# Patient Record
Sex: Female | Born: 1955 | Race: White | Hispanic: No | Marital: Married | Smoking: Never smoker
Health system: Southern US, Community
[De-identification: ages and names within clinical notes are randomized; demographics above are authoritative.]

## PROBLEM LIST (undated history)

## (undated) DIAGNOSIS — K068 Other specified disorders of gingiva and edentulous alveolar ridge: Secondary | ICD-10-CM

## (undated) DIAGNOSIS — C4491 Basal cell carcinoma of skin, unspecified: Secondary | ICD-10-CM

## (undated) DIAGNOSIS — G4730 Sleep apnea, unspecified: Secondary | ICD-10-CM

## (undated) DIAGNOSIS — K146 Glossodynia: Secondary | ICD-10-CM

## (undated) DIAGNOSIS — F319 Bipolar disorder, unspecified: Secondary | ICD-10-CM

## (undated) DIAGNOSIS — F419 Anxiety disorder, unspecified: Secondary | ICD-10-CM

## (undated) DIAGNOSIS — Z8719 Personal history of other diseases of the digestive system: Secondary | ICD-10-CM

## (undated) DIAGNOSIS — D649 Anemia, unspecified: Secondary | ICD-10-CM

## (undated) DIAGNOSIS — M1990 Unspecified osteoarthritis, unspecified site: Secondary | ICD-10-CM

## (undated) DIAGNOSIS — Z9289 Personal history of other medical treatment: Secondary | ICD-10-CM

## (undated) DIAGNOSIS — R011 Cardiac murmur, unspecified: Secondary | ICD-10-CM

## (undated) DIAGNOSIS — R7303 Prediabetes: Secondary | ICD-10-CM

## (undated) HISTORY — PX: TONSILLECTOMY: SUR1361

## (undated) HISTORY — DX: Bipolar disorder, unspecified: F31.9

## (undated) HISTORY — DX: Glossodynia: K14.6

## (undated) HISTORY — PX: GANGLION CYST EXCISION: SHX1691

## (undated) HISTORY — DX: Personal history of other diseases of the digestive system: Z87.19

## (undated) HISTORY — PX: ENDOMETRIAL BIOPSY: SHX622

## (undated) HISTORY — DX: Basal cell carcinoma of skin, unspecified: C44.91

## (undated) HISTORY — DX: Other specified disorders of gingiva and edentulous alveolar ridge: K06.8

---

## 1998-09-06 ENCOUNTER — Other Ambulatory Visit: Admission: RE | Admit: 1998-09-06 | Discharge: 1998-09-06 | Payer: Self-pay | Admitting: Obstetrics & Gynecology

## 1999-12-11 ENCOUNTER — Encounter: Admission: RE | Admit: 1999-12-11 | Discharge: 1999-12-11 | Payer: Self-pay | Admitting: Family Medicine

## 1999-12-11 ENCOUNTER — Encounter: Payer: Self-pay | Admitting: Family Medicine

## 2000-04-04 ENCOUNTER — Encounter: Payer: Self-pay | Admitting: Gastroenterology

## 2000-04-04 ENCOUNTER — Ambulatory Visit (HOSPITAL_COMMUNITY): Admission: RE | Admit: 2000-04-04 | Discharge: 2000-04-04 | Payer: Self-pay | Admitting: Gastroenterology

## 2003-01-05 ENCOUNTER — Other Ambulatory Visit: Admission: RE | Admit: 2003-01-05 | Discharge: 2003-01-05 | Payer: Self-pay | Admitting: *Deleted

## 2004-09-20 ENCOUNTER — Other Ambulatory Visit: Admission: RE | Admit: 2004-09-20 | Discharge: 2004-09-20 | Payer: Self-pay | Admitting: Family Medicine

## 2008-03-19 HISTORY — PX: BASAL CELL CARCINOMA EXCISION: SHX1214

## 2011-09-07 ENCOUNTER — Other Ambulatory Visit: Payer: Self-pay | Admitting: Neurosurgery

## 2011-09-11 ENCOUNTER — Encounter (HOSPITAL_COMMUNITY): Payer: Self-pay | Admitting: Respiratory Therapy

## 2011-09-12 NOTE — Pre-Procedure Instructions (Signed)
20 Andrea Paul   09/12/2011   Your procedure is scheduled on: Monday, July 1ST.            Report to [facility] at  7:55 AM  Call this number if you have problems the morning of surgery: [phone]   Remember:   Do not eat food or drink any liquids:After Midnight  Sunday.     Take these medicines the morning of surgery with A SIP OF WATER:               ATIVAN, TRAMADOL, AND LEXAPRO   Do not wear jewelry, make-up or nail polish.   Do not wear lotions, powders, or perfumes. You may wear deodorant.   Do not shave 48 hours prior to surgery. Men may shave face and neck.   Do not bring valuables to the hospital.  Contacts, dentures or bridgework may not be worn into surgery.   Leave suitcase in the car. After surgery it may be brought to your room.  For patients admitted to the hospital, checkout time is 11:00 AM the day of discharge.   Patients discharged the day of surgery will not be allowed to drive home.  Name and phone number of your driver:    Special Instructions: CHG Shower Use Special Wash: 1/2 bottle night before surgery and 1/2 bottle morning of surgery.   Please read over the following fact sheets that you were given: Pain Booklet, Coughing and Deep Breathing, Blood Transfusion Information, MRSA Information and Surgical Site Infection Prevention

## 2011-09-13 ENCOUNTER — Encounter (HOSPITAL_COMMUNITY)
Admission: RE | Admit: 2011-09-13 | Discharge: 2011-09-13 | Disposition: A | Discharge status: Home / Self Care | Payer: 59 | Source: Ambulatory Visit | Attending: Neurosurgery | Admitting: Neurosurgery

## 2011-09-13 ENCOUNTER — Encounter (HOSPITAL_COMMUNITY): Payer: Self-pay

## 2011-09-13 HISTORY — DX: Unspecified osteoarthritis, unspecified site: M19.90

## 2011-09-13 HISTORY — DX: Cardiac murmur, unspecified: R01.1

## 2011-09-13 HISTORY — DX: Personal history of other medical treatment: Z92.89

## 2011-09-13 HISTORY — DX: Anxiety disorder, unspecified: F41.9

## 2011-09-13 LAB — SURGICAL PCR SCREEN
MRSA, PCR: NEGATIVE
Staphylococcus aureus: NEGATIVE

## 2011-09-13 LAB — CBC
HCT: 41.8 % (ref 36.0–46.0)
Platelets: 186 10*3/uL (ref 150–400)
RDW: 12.3 % (ref 11.5–15.5)
WBC: 6.6 10*3/uL (ref 4.0–10.5)

## 2011-09-13 NOTE — Progress Notes (Signed)
Primary Physician - Dr. Sandi Mealy @[facility]  Does not have a cardiologist No EKG or other cardiac workup available

## 2011-09-16 MED ORDER — CEFAZOLIN SODIUM 1-5 GM-% IV SOLN
1.0000 g | INTRAVENOUS | Status: AC
  Administered 2011-09-17: 1 g via INTRAVENOUS
  Filled 2011-09-16: qty 50

## 2011-09-17 ENCOUNTER — Encounter (HOSPITAL_COMMUNITY): Payer: Self-pay | Admitting: Anesthesiology

## 2011-09-17 ENCOUNTER — Ambulatory Visit (HOSPITAL_COMMUNITY)
Admission: RE | Admit: 2011-09-17 | Discharge: 2011-09-18 | Disposition: A | Discharge status: Home / Self Care | Payer: 59 | Source: Ambulatory Visit | Attending: Neurosurgery | Admitting: Neurosurgery

## 2011-09-17 ENCOUNTER — Ambulatory Visit (HOSPITAL_COMMUNITY): Payer: 59

## 2011-09-17 ENCOUNTER — Ambulatory Visit (HOSPITAL_COMMUNITY): Payer: 59 | Admitting: Anesthesiology

## 2011-09-17 ENCOUNTER — Encounter (HOSPITAL_COMMUNITY): Payer: Self-pay | Admitting: Surgery

## 2011-09-17 ENCOUNTER — Encounter (HOSPITAL_COMMUNITY)
Admission: RE | Disposition: A | Discharge status: Home / Self Care | Payer: Self-pay | Source: Ambulatory Visit | Attending: Neurosurgery

## 2011-09-17 DIAGNOSIS — Z01812 Encounter for preprocedural laboratory examination: Secondary | ICD-10-CM | POA: Insufficient documentation

## 2011-09-17 DIAGNOSIS — M129 Arthropathy, unspecified: Secondary | ICD-10-CM | POA: Insufficient documentation

## 2011-09-17 DIAGNOSIS — F3289 Other specified depressive episodes: Secondary | ICD-10-CM | POA: Insufficient documentation

## 2011-09-17 DIAGNOSIS — M47812 Spondylosis without myelopathy or radiculopathy, cervical region: Secondary | ICD-10-CM | POA: Insufficient documentation

## 2011-09-17 DIAGNOSIS — F411 Generalized anxiety disorder: Secondary | ICD-10-CM | POA: Insufficient documentation

## 2011-09-17 DIAGNOSIS — M5030 Other cervical disc degeneration, unspecified cervical region: Secondary | ICD-10-CM | POA: Insufficient documentation

## 2011-09-17 DIAGNOSIS — F329 Major depressive disorder, single episode, unspecified: Secondary | ICD-10-CM | POA: Insufficient documentation

## 2011-09-17 DIAGNOSIS — E7800 Pure hypercholesterolemia, unspecified: Secondary | ICD-10-CM | POA: Insufficient documentation

## 2011-09-17 HISTORY — PX: ANTERIOR CERVICAL DECOMP/DISCECTOMY FUSION: SHX1161

## 2011-09-17 SURGERY — ANTERIOR CERVICAL DECOMPRESSION/DISCECTOMY FUSION 2 LEVELS
Anesthesia: General | Site: Spine Cervical | Laterality: Bilateral | Wound class: Clean

## 2011-09-17 MED ORDER — OXYCODONE-ACETAMINOPHEN 5-325 MG PO TABS
ORAL_TABLET | ORAL | Status: AC
  Filled 2011-09-17: qty 2

## 2011-09-17 MED ORDER — EPHEDRINE SULFATE 50 MG/ML IJ SOLN
INTRAMUSCULAR | Status: DC | PRN
  Administered 2011-09-17: 15 mg via INTRAVENOUS

## 2011-09-17 MED ORDER — HYDROCODONE-ACETAMINOPHEN 5-325 MG PO TABS: 1.0000 | ORAL_TABLET | ORAL | Status: DC | PRN

## 2011-09-17 MED ORDER — MIDAZOLAM HCL 5 MG/5ML IJ SOLN
INTRAMUSCULAR | Status: DC | PRN
  Administered 2011-09-17: 2 mg via INTRAVENOUS

## 2011-09-17 MED ORDER — ROCURONIUM BROMIDE 100 MG/10ML IV SOLN
INTRAVENOUS | Status: DC | PRN
  Administered 2011-09-17: 50 mg via INTRAVENOUS

## 2011-09-17 MED ORDER — LAMOTRIGINE 200 MG PO TABS
200.0000 mg | ORAL_TABLET | Freq: Every day | ORAL | Status: DC
  Administered 2011-09-17: 200 mg via ORAL
  Filled 2011-09-17 (×2): qty 1

## 2011-09-17 MED ORDER — LIDOCAINE HCL 4 % MT SOLN
OROMUCOSAL | Status: DC | PRN
  Administered 2011-09-17: 4 mL via TOPICAL

## 2011-09-17 MED ORDER — OXYCODONE-ACETAMINOPHEN 5-325 MG PO TABS
1.0000 | ORAL_TABLET | ORAL | Status: DC | PRN
  Administered 2011-09-17 (×3): 2 via ORAL
  Administered 2011-09-18: 1 via ORAL
  Filled 2011-09-17: qty 2
  Filled 2011-09-17: qty 1
  Filled 2011-09-17: qty 2

## 2011-09-17 MED ORDER — SODIUM CHLORIDE 0.9 % IJ SOLN: 3.0000 mL | INTRAMUSCULAR | Status: DC | PRN

## 2011-09-17 MED ORDER — BUPIVACAINE HCL (PF) 0.25 % IJ SOLN
INTRAMUSCULAR | Status: DC | PRN
  Administered 2011-09-17: 3.5 mL

## 2011-09-17 MED ORDER — ONDANSETRON HCL 4 MG/2ML IJ SOLN
INTRAMUSCULAR | Status: AC
  Filled 2011-09-17: qty 2

## 2011-09-17 MED ORDER — CYCLOBENZAPRINE HCL 10 MG PO TABS
ORAL_TABLET | ORAL | Status: AC
  Filled 2011-09-17: qty 1

## 2011-09-17 MED ORDER — 0.9 % SODIUM CHLORIDE (POUR BTL) OPTIME
TOPICAL | Status: DC | PRN
  Administered 2011-09-17: 1000 mL

## 2011-09-17 MED ORDER — MENTHOL 3 MG MT LOZG: 1.0000 | LOZENGE | OROMUCOSAL | Status: DC | PRN

## 2011-09-17 MED ORDER — PROPOFOL 10 MG/ML IV EMUL
INTRAVENOUS | Status: DC | PRN
  Administered 2011-09-17: 170 mg via INTRAVENOUS

## 2011-09-17 MED ORDER — HYDROMORPHONE HCL PF 1 MG/ML IJ SOLN
INTRAMUSCULAR | Status: AC
  Filled 2011-09-17: qty 1

## 2011-09-17 MED ORDER — ONDANSETRON HCL 4 MG/2ML IJ SOLN
4.0000 mg | Freq: Four times a day (QID) | INTRAMUSCULAR | Status: AC | PRN
  Administered 2011-09-17: 4 mg via INTRAVENOUS

## 2011-09-17 MED ORDER — DOCUSATE SODIUM 100 MG PO CAPS: 100.0000 mg | ORAL_CAPSULE | Freq: Every day | ORAL | Status: DC | PRN

## 2011-09-17 MED ORDER — INSULIN ASPART 100 UNIT/ML ~~LOC~~ SOLN
SUBCUTANEOUS | Status: AC
  Filled 2011-09-17: qty 1

## 2011-09-17 MED ORDER — ONDANSETRON HCL 4 MG/2ML IJ SOLN
INTRAMUSCULAR | Status: DC | PRN
  Administered 2011-09-17: 4 mg via INTRAVENOUS

## 2011-09-17 MED ORDER — SODIUM CHLORIDE 0.9 % IR SOLN
Status: DC | PRN
  Administered 2011-09-17: 10:00:00

## 2011-09-17 MED ORDER — GLYCOPYRROLATE 0.2 MG/ML IJ SOLN
INTRAMUSCULAR | Status: DC | PRN
  Administered 2011-09-17: 0.4 mg via INTRAVENOUS

## 2011-09-17 MED ORDER — THROMBIN 5000 UNITS EX KIT
PACK | CUTANEOUS | Status: DC | PRN
  Administered 2011-09-17 (×2): 5000 [IU] via TOPICAL

## 2011-09-17 MED ORDER — FENTANYL CITRATE 0.05 MG/ML IJ SOLN
INTRAMUSCULAR | Status: DC | PRN
  Administered 2011-09-17: 100 ug via INTRAVENOUS
  Administered 2011-09-17: 50 ug via INTRAVENOUS

## 2011-09-17 MED ORDER — ACETAMINOPHEN 650 MG RE SUPP: 650.0000 mg | RECTAL | Status: DC | PRN

## 2011-09-17 MED ORDER — HYDROMORPHONE HCL PF 1 MG/ML IJ SOLN: 0.2500 mg | INTRAMUSCULAR | Status: DC | PRN

## 2011-09-17 MED ORDER — ALUM & MAG HYDROXIDE-SIMETH 200-200-20 MG/5ML PO SUSP: 30.0000 mL | Freq: Four times a day (QID) | ORAL | Status: DC | PRN

## 2011-09-17 MED ORDER — PHENOL 1.4 % MT LIQD: 1.0000 | OROMUCOSAL | Status: DC | PRN

## 2011-09-17 MED ORDER — KETOROLAC TROMETHAMINE 30 MG/ML IJ SOLN
30.0000 mg | Freq: Once | INTRAMUSCULAR | Status: AC
  Administered 2011-09-17: 30 mg via INTRAVENOUS

## 2011-09-17 MED ORDER — HYDROXYZINE HCL 50 MG/ML IM SOLN: 50.0000 mg | INTRAMUSCULAR | Status: DC | PRN

## 2011-09-17 MED ORDER — HEMOSTATIC AGENTS (NO CHARGE) OPTIME
TOPICAL | Status: DC | PRN
  Administered 2011-09-17: 1 via TOPICAL

## 2011-09-17 MED ORDER — NEOSTIGMINE METHYLSULFATE 1 MG/ML IJ SOLN
INTRAMUSCULAR | Status: DC | PRN
  Administered 2011-09-17: 3 mg via INTRAVENOUS

## 2011-09-17 MED ORDER — ZOLPIDEM TARTRATE 5 MG PO TABS: 5.0000 mg | ORAL_TABLET | Freq: Every evening | ORAL | Status: DC | PRN

## 2011-09-17 MED ORDER — LIDOCAINE HCL (CARDIAC) 20 MG/ML IV SOLN
INTRAVENOUS | Status: DC | PRN
  Administered 2011-09-17: 80 mg via INTRAVENOUS

## 2011-09-17 MED ORDER — BISACODYL 10 MG RE SUPP: 10.0000 mg | Freq: Every day | RECTAL | Status: DC | PRN

## 2011-09-17 MED ORDER — LIDOCAINE-EPINEPHRINE 1 %-1:100000 IJ SOLN
INTRAMUSCULAR | Status: DC | PRN
  Administered 2011-09-17: 3.5 mL

## 2011-09-17 MED ORDER — SODIUM CHLORIDE 0.9 % IV SOLN
INTRAVENOUS | Status: AC
  Filled 2011-09-17: qty 500

## 2011-09-17 MED ORDER — HYDROXYZINE HCL 25 MG PO TABS: 50.0000 mg | ORAL_TABLET | ORAL | Status: DC | PRN

## 2011-09-17 MED ORDER — ACETAMINOPHEN 325 MG PO TABS: 650.0000 mg | ORAL_TABLET | ORAL | Status: DC | PRN

## 2011-09-17 MED ORDER — KCL IN DEXTROSE-NACL 20-5-0.45 MEQ/L-%-% IV SOLN
INTRAVENOUS | Status: DC
  Filled 2011-09-17 (×5): qty 1000

## 2011-09-17 MED ORDER — ESCITALOPRAM OXALATE 5 MG PO TABS
15.0000 mg | ORAL_TABLET | Freq: Every day | ORAL | Status: DC
  Administered 2011-09-18: 15 mg via ORAL
  Filled 2011-09-17 (×2): qty 1

## 2011-09-17 MED ORDER — KETOROLAC TROMETHAMINE 30 MG/ML IJ SOLN
INTRAMUSCULAR | Status: AC
  Filled 2011-09-17: qty 1

## 2011-09-17 MED ORDER — SIMVASTATIN 20 MG PO TABS
20.0000 mg | ORAL_TABLET | Freq: Every evening | ORAL | Status: DC
  Administered 2011-09-17: 20 mg via ORAL
  Filled 2011-09-17 (×2): qty 1

## 2011-09-17 MED ORDER — BACITRACIN 50000 UNITS IM SOLR
INTRAMUSCULAR | Status: AC
  Filled 2011-09-17: qty 1

## 2011-09-17 MED ORDER — CYCLOBENZAPRINE HCL 10 MG PO TABS
10.0000 mg | ORAL_TABLET | Freq: Three times a day (TID) | ORAL | Status: DC | PRN
  Administered 2011-09-17 (×2): 10 mg via ORAL
  Filled 2011-09-17: qty 1

## 2011-09-17 MED ORDER — SODIUM CHLORIDE 0.9 % IJ SOLN
3.0000 mL | Freq: Two times a day (BID) | INTRAMUSCULAR | Status: DC
  Administered 2011-09-17 – 2011-09-18 (×3): 3 mL via INTRAVENOUS

## 2011-09-17 MED ORDER — KETOROLAC TROMETHAMINE 30 MG/ML IJ SOLN
30.0000 mg | Freq: Four times a day (QID) | INTRAMUSCULAR | Status: DC
  Administered 2011-09-17 – 2011-09-18 (×3): 30 mg via INTRAVENOUS
  Filled 2011-09-17 (×7): qty 1

## 2011-09-17 MED ORDER — MAGNESIUM HYDROXIDE 400 MG/5ML PO SUSP: 30.0000 mL | Freq: Every day | ORAL | Status: DC | PRN

## 2011-09-17 MED ORDER — LORAZEPAM 1 MG PO TABS: 1.0000 mg | ORAL_TABLET | Freq: Four times a day (QID) | ORAL | Status: DC | PRN

## 2011-09-17 MED ORDER — LACTATED RINGERS IV SOLN
INTRAVENOUS | Status: DC | PRN
  Administered 2011-09-17 (×2): via INTRAVENOUS

## 2011-09-17 MED ORDER — MORPHINE SULFATE 4 MG/ML IJ SOLN: 4.0000 mg | INTRAMUSCULAR | Status: DC | PRN

## 2011-09-17 SURGICAL SUPPLY — 60 items
ADH SKN CLS APL DERMABOND .7 (GAUZE/BANDAGES/DRESSINGS) ×1
ALLOGRAFT 7X14X11 (Bone Implant) ×2 IMPLANT
APL SKNCLS STERI-STRIP NONHPOA (GAUZE/BANDAGES/DRESSINGS) ×1
BAG DECANTER FOR FLEXI CONT (MISCELLANEOUS) ×2 IMPLANT
BENZOIN TINCTURE PRP APPL 2/3 (GAUZE/BANDAGES/DRESSINGS) ×1 IMPLANT
BIT DRILL NEURO 2X3.1 SFT TUCH (MISCELLANEOUS) ×1 IMPLANT
BLADE ULTRA TIP 2M (BLADE) ×2 IMPLANT
BRUSH SCRUB EZ PLAIN DRY (MISCELLANEOUS) ×2 IMPLANT
CANISTER SUCTION 2500CC (MISCELLANEOUS) ×2 IMPLANT
CLOTH BEACON ORANGE TIMEOUT ST (SAFETY) ×2 IMPLANT
CONT SPEC 4OZ CLIKSEAL STRL BL (MISCELLANEOUS) ×2 IMPLANT
COVER MAYO STAND STRL (DRAPES) ×2 IMPLANT
DECANTER SPIKE VIAL GLASS SM (MISCELLANEOUS) ×2 IMPLANT
DERMABOND ADVANCED (GAUZE/BANDAGES/DRESSINGS) ×1
DERMABOND ADVANCED .7 DNX12 (GAUZE/BANDAGES/DRESSINGS) ×1 IMPLANT
DRAPE LAPAROTOMY 100X72 PEDS (DRAPES) ×2 IMPLANT
DRAPE MICROSCOPE LEICA (MISCELLANEOUS) ×2 IMPLANT
DRAPE POUCH INSTRU U-SHP 10X18 (DRAPES) ×2 IMPLANT
DRAPE PROXIMA HALF (DRAPES) ×1 IMPLANT
DRILL NEURO 2X3.1 SOFT TOUCH (MISCELLANEOUS) ×2
ELECT COATED BLADE 2.86 ST (ELECTRODE) ×2 IMPLANT
ELECT REM PT RETURN 9FT ADLT (ELECTROSURGICAL) ×2
ELECTRODE REM PT RTRN 9FT ADLT (ELECTROSURGICAL) ×1 IMPLANT
GLOVE BIOGEL PI IND STRL 7.0 (GLOVE) IMPLANT
GLOVE BIOGEL PI IND STRL 8 (GLOVE) ×1 IMPLANT
GLOVE BIOGEL PI INDICATOR 7.0 (GLOVE) ×3
GLOVE BIOGEL PI INDICATOR 8 (GLOVE) ×1
GLOVE ECLIPSE 7.5 STRL STRAW (GLOVE) ×1 IMPLANT
GLOVE EXAM NITRILE LRG STRL (GLOVE) IMPLANT
GLOVE EXAM NITRILE MD LF STRL (GLOVE) ×1 IMPLANT
GLOVE EXAM NITRILE XL STR (GLOVE) IMPLANT
GLOVE EXAM NITRILE XS STR PU (GLOVE) IMPLANT
GLOVE SURG SS PI 6.5 STRL IVOR (GLOVE) ×2 IMPLANT
GLOVE SURG SS PI 7.0 STRL IVOR (GLOVE) ×2 IMPLANT
GLOVE SURG SS PI 8.0 STRL IVOR (GLOVE) ×1 IMPLANT
GLOVE SURG SS PI 8.5 STRL IVOR (GLOVE) ×1
GLOVE SURG SS PI 8.5 STRL STRW (GLOVE) IMPLANT
GOWN BRE IMP SLV AUR LG STRL (GOWN DISPOSABLE) ×3 IMPLANT
GOWN BRE IMP SLV AUR XL STRL (GOWN DISPOSABLE) ×2 IMPLANT
GOWN STRL REIN 2XL LVL4 (GOWN DISPOSABLE) IMPLANT
HEAD HALTER (SOFTGOODS) ×2 IMPLANT
KIT BASIN OR (CUSTOM PROCEDURE TRAY) ×2 IMPLANT
KIT ROOM TURNOVER OR (KITS) ×2 IMPLANT
NDL HYPO 25X1 1.5 SAFETY (NEEDLE) ×1 IMPLANT
NDL SPNL 22GX3.5 QUINCKE BK (NEEDLE) ×1 IMPLANT
NEEDLE HYPO 25X1 1.5 SAFETY (NEEDLE) ×2 IMPLANT
NEEDLE SPNL 22GX3.5 QUINCKE BK (NEEDLE) ×2 IMPLANT
NS IRRIG 1000ML POUR BTL (IV SOLUTION) ×2 IMPLANT
PACK LAMINECTOMY NEURO (CUSTOM PROCEDURE TRAY) ×2 IMPLANT
PAD ARMBOARD 7.5X6 YLW CONV (MISCELLANEOUS) ×6 IMPLANT
RUBBERBAND STERILE (MISCELLANEOUS) ×4 IMPLANT
SPONGE GAUZE 4X4 12PLY (GAUZE/BANDAGES/DRESSINGS) ×1 IMPLANT
SPONGE INTESTINAL PEANUT (DISPOSABLE) ×3 IMPLANT
SPONGE SURGIFOAM ABS GEL SZ50 (HEMOSTASIS) ×2 IMPLANT
SUT VIC AB 2-0 CP2 18 (SUTURE) ×2 IMPLANT
SUT VIC AB 3-0 SH 8-18 (SUTURE) ×3 IMPLANT
SYR 20ML ECCENTRIC (SYRINGE) ×2 IMPLANT
TOWEL OR 17X24 6PK STRL BLUE (TOWEL DISPOSABLE) ×2 IMPLANT
TOWEL OR 17X26 10 PK STRL BLUE (TOWEL DISPOSABLE) ×2 IMPLANT
WATER STERILE IRR 1000ML POUR (IV SOLUTION) ×2 IMPLANT

## 2011-09-17 NOTE — Op Note (Signed)
09/17/2011  12:38 PM  PATIENT:  Andrea Paul  56 y.o. female  PRE-OPERATIVE DIAGNOSIS:  cervical five-six, six-seven herniated disc,spondylosis, degenerative disc disease, radiculopathy  POST-OPERATIVE DIAGNOSIS:  cervical five-six, six-seven herniated disc,spondylosis, degenerative disc disease, radiculopathy  PROCEDURE:  Procedure(s): ANTERIOR CERVICAL DECOMPRESSION/DISCECTOMY FUSION 2 LEVELS: C5-6 and C6-7 anterior cervical decompression and arthrodesis with allograft and tether cervical plating  SURGEON:  Surgeon(s): Hewitt Shorts, MD Temple Pacini, MD  ASSISTANTS: Temple Pacini, M.D.  ANESTHESIA:   general  EBL:  Total I/O In: 1000 [I.V.:1000] Out: -   BLOOD ADMINISTERED:none  COUNT: Correct per nursing staff  DICTATION: Patient was brought to the operating room placed under general endotracheal anesthesia. Patient was placed in 10 pounds of halter traction. The neck was prepped with Betadine soap and solution and draped in a sterile fashion. A horizontal incision was made on the left side of the neck. The line of the incision was infiltrated with local anesthetic with epinephrine. Dissection was carried down thru the subcutaneous tissue and platysma, bipolar cautery was used to maintain hemostasis. Dissection was then carried out thru an avascular plane leaving the sternocleidomastoid carotid artery and jugular vein laterally and the trachea and esophagus medially. The ventral aspect of the vertebral column was identified and a localizing x-ray was taken. The C5-6 and C6-7 levels were identified. The annulus at each level was incised and the disc space entered. Discectomy was performed with micro-curettes and pituitary rongeurs. The operating microscope was draped and brought into the field provided additional magnification illumination and visualization. Discectomy was continued posteriorly thru the disc space and then the cartilaginous endplate was removed using micro-curettes  along with the high-speed drill. Posterior osteophytic overgrowth was removed each level using the high-speed drill along with a 2 mm thin footplated Kerrison punch. Posterior longitudinal ligament along with disc herniation was carefully removed, decompressing the spinal canal and thecal sac. We then continued to remove osteophytic overgrowth and disc material, decompressing the neural foramina and exiting nerve roots bilaterally. Once the decompression was completed hemostasis was established at each level with the use of Gelfoam with thrombin and bipolar cautery. The Gelfoam was removed the wound irrigated and hemostasis confirmed. We then measured the height of the intravertebral disc space level and selected a 7 millimeter in height structural allograft for the C5-6 level and a 7 millimeter in height structural allograft for the C6-7 level . Each was hydrated and saline solution and then gently positioned in the intravertebral disc space and countersunk. We then selected a 32 millimeter in height Tether cervical plate. It was positioned over the fusion construct and secured to the vertebra with a pair of 4 x 12 mm fixed screws at the C5 level, a single 4 x 12 mm variable screw at the C6 level, and a pair of 4 x 12 mm fixed screws at the C7 level. Each screw hole was started with the high-speed drill and then the screws placed, once all the screws were placed final tightening was performed. The wound was irrigated with bacitracin solution checked for hemostasis which was established and confirmed. An x-ray was taken which showed grafts in good position, the plate in good position, the screws in good position, and the overall alignment to be good. . We then proceeded with closure. The platysma was closed with interrupted inverted 2-0 undyed Vicryl suture, the subcutaneous and subcuticular closed with interrupted inverted 3-0 undyed Vicryl suture. The skin edges were approximated with Dermabond. Following surgery  the  patient was taken out of cervical traction. To be reversed and the anesthetic and taken to the recovery room for further care.   PLAN OF CARE: Admit for overnight observation  PATIENT DISPOSITION:  PACU - hemodynamically stable.   Delay start of Pharmacological VTE agent (>24hrs) due to surgical blood loss or risk of bleeding:  yes

## 2011-09-17 NOTE — Preoperative (Signed)
Beta Blockers   Reason not to administer Beta Blockers:Not Applicable 

## 2011-09-17 NOTE — Progress Notes (Signed)
Pt informed Nurse that she needed to take another 1 mg Ativan due to anxiety. Dr. Chaney Malling called and notified of this and stated that patient could take Ativan with a small sip of water. Will update patient on plan of care.

## 2011-09-17 NOTE — Anesthesia Procedure Notes (Addendum)
Procedure Name: Intubation Date/Time: 09/17/2011 9:52 AM Performed by: Jerilee Hoh Pre-anesthesia Checklist: Patient identified, Emergency Drugs available, Suction available and Patient being monitored Patient Re-evaluated:Patient Re-evaluated prior to inductionOxygen Delivery Method: Circle system utilized Preoxygenation: Pre-oxygenation with 100% oxygen Intubation Type: IV induction Ventilation: Mask ventilation without difficulty Laryngoscope Size: Mac and 3 Grade View: Grade II Tube type: Oral Tube size: 7.5 mm Number of attempts: 1 Airway Equipment and Method: Stylet and LTA kit utilized Placement Confirmation: ETT inserted through vocal cords under direct vision,  positive ETCO2 and breath sounds checked- equal and bilateral Secured at: 22 cm Tube secured with: Tape Dental Injury: Teeth and Oropharynx as per pre-operative assessment

## 2011-09-17 NOTE — H&P (Signed)
Andrea Paul    DOB:  [birth year]     HISTORY OF PRESENT ILLNESS: The patient is a 56 year old, right-handed, white female who is seen in neurosurgical consultation for evaluation of cervical radiculopathy.  The patient explains that symptoms began about a month ago without any particular inciting cause with pain in the base of her neck radiating down into the right shoulder and arm, occasionally radiating into the right forearm and into the right hand to the right fifth digit.    She finds it difficult to find a comfortable position.  She does have occasional numbness and tingling in the medial right forearm extending into the right hand's fifth digit and she describes a sense of weakness at times in the right upper extremity.  She has been under the treatment and care of Dr. Lunette Stands who treated her with two prednisone dosepaks, but she has had no improvement with that.  She was prescribed Hydrocodone which did cause some nausea and she was switched to Tramadol which does relieve some of the discomfort, but she is still having significant disabling pain.    The patient describes a similar, but somewhat less severe episode that occurred last fall.  She was treated with two prednisone dosepaks and the symptoms improved.  However, the current episode has not improved with the prednisone dosepaks.    The patient contacted the office requesting neurosurgical evaluation.    PAST MEDICAL HISTORY: Notable for a history of hypercholesterolemia which is treated.  She does not describe any history of hypertension, myocardial infarction, cancer, stroke, diabetes, peptic ulcer disease or lung disease.    PAST SURGICAL HISTORY: Previous surgery includes a tonsillectomy at age 19 and surgery for right wrist ganglion cyst at age 81.    DRUG ALLERGIES: SHE REPORTS AN ALLERGY TO LATEX CAUSING ITCHING.    MEDICATIONS:  Current medications include Simvastatin 20 mg q day, Lamictal 200 mg q day for mood  stabilizer, Ativan 1 mg once or twice a day for anxiety, Lexapro 10 mg 1  tablets per day for depression, Tramadol 50 mg 1 or 2 tablets q 4 hours prn pain.    FAMILY HISTORY:  Mother is age 33 in fair to good health with rheumatoid arthritis.  Father is age 62 in fairly good health with a history of heart disease.    SOCIAL HISTORY:  The patient is married.  She doesn't smoke, drink alcoholic beverages or have a history of substance abuse.    REVIEW OF SYSTEMS:  Notable for those difficulties described in her History or Present Illness and Past Medical History, but a 14-point Review of Systems sheet is otherwise unremarkable.    PHYSICAL EXAMINATION: The patient is a well-developed, well-nourished, white female in no acute distress.  Her height is 5', 11".  Weight 156 lbs.  She is afebrile.  Lungs are clear to auscultation.  She has symmetrical respiratory excursion.  Heart has a regular rate and rhythm with normal S1 and S2; there is no murmur.  Extremity examination shows no clubbing, cyanosis or edema.  Musculoskeletal examination shows no tenderness to palpation over the cervical spinous process or paracervical musculature.  Range of motion of the neck is present, but there is discomfort particularly with extension of the neck, also some discomfort with lateral flexion to either side.  No discomfort with forward flexion.  She has a mildly positive Tinel's at the ulnar nerve as it goes to the epicondylar groove, worse on the right than the left  side.    NEUROLOGICAL EXAMINATION: 5/5 strength through the upper extremities including the deltoid, biceps, triceps, intrinsics and grip.  Sensation is intact to pinprick through the digits of the upper extremities.  Reflexes are 2 to 3 at the biceps, brachioradialis, triceps and quadriceps, 2 at the gastrocnemii.  They are symmetrical bilaterally and toes are downgoing bilaterally.  She has a normal gait and stance.    DIAGNOSTIC STUDIES:    Study:   Cervical spine x-ray.  View:  AP, lateral, flexion, extension, obliques and open mouth (7 views).  Indications:  Neck and right cervical radicular pain.  Findings:  The patient has multilevel cervical spondylosis and degenerative disc disease.  There is a marked kyphosis.  There is degenerative disc disease and spondylosis seen particularly at C6-7, to a lesser extent at C5-6.  Oblique images show osteophytic neural foraminal encroachment bilaterally at C6-7 and to the right side at C5-6.  No instability is seen through flexion and extension.    Study:  MRI shows multilevel degenerative disc disease and spondylosis with superimposed spondylitic disc herniation C5-6 worse than C6-7.  At C5-6, there is a broad-based spondylitic disc herniation with large osteophytic neural foraminal encroachment right worse than left. There is moderate spinal cord compression although no alternation of cord signal.  We see somewhat milder degenerative changes at C6-7 with broad-based spondylitic disc protrusion, bilateral osteophytic neural foraminal encroachment right worse than left, but certainly not as pronounced at C6-7 as at C5-6.    IMPRESSION:  Right cervical radiculopathy in a patient with degenerative disc disease and spondylosis C6-7 worse than C5-6, with superimposed spondylitic disc herniation.  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 Unfortunately, no response to two prednisone dosepaks, nor to Relafen.    RECOMMENDATIONS: I discussed my assessment and impression with the patient and her husband and reviewed her x-rays and MRI with them.  I feel that with the significant spinal cord compression at the C5-6 level, and she remains significantly symptomatic, that although with time the symptoms might improve that left as it is she will eventually develop myelopathy, and I feel it is prudent to proceed with decompression and stabilization.   I have recommended a 2-level C5-6  and C6-7 anterior cervical decompression and arthrodesis with Allograft and cervical plating.  I have discussed the nature of the procedure using her x-rays, MRI scan and a model in the office today.  We discussed the typical length of surgery, hospital stay and overall recuperation, limitations postoperatively, and the need for postop immobilization in a soft cervical collar and risks of surgery including infection, bleeding, possible need for transfusion, risk of nerve dysfunction, pain, weakness, numbness or paresthesias and the risk of spinal cord dysfunction with paralysis of all four limbs and quadriplegia, the risk of failure of the arthrodesis and possible need for further surgery, and anesthetic risks of myocardial infarction, stroke, pneumonia and death.  Understanding all this the patient does want to proceed with surgery and is admitted for such.  [facility]    Hewitt Shorts, M.D.

## 2011-09-17 NOTE — Transfer of Care (Signed)
Immediate Anesthesia Transfer of Care Note  Patient: Andrea Paul  Procedure(s) Performed: Procedure(s) (LRB): ANTERIOR CERVICAL DECOMPRESSION/DISCECTOMY FUSION 2 LEVELS (Bilateral)  Patient Location: PACU  Anesthesia Type: General  Level of Consciousness: awake, alert , oriented and patient cooperative  Airway & Oxygen Therapy: Patient Spontanous Breathing and Patient connected to face mask oxygen  Post-op Assessment: Report given to PACU RN, Post -op Vital signs reviewed and stable and Patient moving all extremities  Post vital signs: Reviewed and stable  Complications: No apparent anesthesia complications

## 2011-09-17 NOTE — Anesthesia Preprocedure Evaluation (Addendum)
Anesthesia Evaluation  Patient identified by MRN, date of birth, ID band Patient awake    Reviewed: Allergy & Precautions, H&P , NPO status , Patient's Chart, lab work & pertinent test results, reviewed documented beta blocker date and time   Airway Mallampati: II  Neck ROM: full    Dental  (+) Dental Advisory Given and Teeth Intact   Pulmonary          Cardiovascular     Neuro/Psych Anxiety Depression    GI/Hepatic   Endo/Other    Renal/GU      Musculoskeletal  (+) Arthritis -,   Abdominal   Peds  Hematology   Anesthesia Other Findings   Reproductive/Obstetrics                          Anesthesia Physical Anesthesia Plan  ASA: II  Anesthesia Plan: General   Post-op Pain Management:    Induction: Intravenous  Airway Management Planned: Oral ETT  Additional Equipment:   Intra-op Plan:   Post-operative Plan: Extubation in OR  Informed Consent: I have reviewed the patients History and Physical, chart, labs and discussed the procedure including the risks, benefits and alternatives for the proposed anesthesia with the patient or authorized representative who has indicated his/her understanding and acceptance.   Dental advisory given  Plan Discussed with: CRNA, Surgeon and Anesthesiologist  Anesthesia Plan Comments:        Anesthesia Quick Evaluation

## 2011-09-17 NOTE — Plan of Care (Signed)
Problem: Consults Goal: Diagnosis - Spinal Surgery Outcome: Completed/Met Date Met:  09/17/11 Cervical Spine Fusion

## 2011-09-17 NOTE — Anesthesia Postprocedure Evaluation (Signed)
Anesthesia Post Note  Patient: Andrea Paul  Procedure(s) Performed: Procedure(s) (LRB): ANTERIOR CERVICAL DECOMPRESSION/DISCECTOMY FUSION 2 LEVELS (Bilateral)  Anesthesia type: General  Patient location: PACU  Post pain: Pain level controlled and Adequate analgesia  Post assessment: Post-op Vital signs reviewed, Patient's Cardiovascular Status Stable, Respiratory Function Stable, Patent Airway and Pain level controlled  Last Vitals:  Filed Vitals:   09/17/11 1245  BP:   Pulse: 82  Temp:   Resp: 15    Post vital signs: Reviewed and stable  Level of consciousness: awake, alert  and oriented  Complications: No apparent anesthesia complications

## 2011-09-18 ENCOUNTER — Encounter (HOSPITAL_COMMUNITY): Payer: Self-pay | Admitting: Neurosurgery

## 2011-09-18 MED ORDER — CYCLOBENZAPRINE HCL 10 MG PO TABS: 10.0000 mg | ORAL_TABLET | Freq: Three times a day (TID) | ORAL | Status: AC | PRN

## 2011-09-18 MED ORDER — OXYCODONE-ACETAMINOPHEN 5-325 MG PO TABS: 1.0000 | ORAL_TABLET | ORAL | Status: AC | PRN

## 2011-09-18 NOTE — Discharge Instructions (Signed)

## 2011-09-18 NOTE — Discharge Summary (Signed)
Physician Discharge Summary  Patient ID: Andrea Paul MRN: [id number] DOB/AGE: December 13, 1955 56 y.o.  Admit date: 09/17/2011 Discharge date: 09/18/2011  Admission Diagnoses:  Cervical disc herniation, cervical spondylosis, cervical degenerative disc disease, cervical radiculopathy  Discharge Diagnoses:  Cervical disc herniation, cervical spondylosis, cervical degenerative disc disease, cervical radiculopathy   Discharged Condition: good  Hospital Course: Patient was admitted underwent a 2 level C5-6 and C6-7 anterior cervical decompression and arthrodesis with allograft and tether cervical plating. Postoperatively she has had excellent relief of her right cervical radiculopathy. Wound is healing nicely. Moving all 4 extremities well. Up and ambulating. We will discharge her this morning to home, we have given her instructions regarding wound care and activities, she is to return for followup with me in the office in 3 weeks.  Discharge Exam: Blood pressure 110/68, pulse 88, temperature 99.2 F (37.3 C), temperature source Oral, resp. rate 16, SpO2 93.00%.  Disposition: Home   Medication List  As of 09/18/2011  8:29 AM   TAKE these medications         CALCIUM + D PO   Take 1 tablet by mouth daily.      cyclobenzaprine 10 MG tablet   Commonly known as: FLEXERIL   Take 1 tablet (10 mg total) by mouth 3 (three) times daily as needed for muscle spasms.      escitalopram 10 MG tablet   Commonly known as: LEXAPRO   Take 15 mg by mouth daily.      lamoTRIgine 200 MG tablet   Commonly known as: LAMICTAL   Take 200 mg by mouth at bedtime.      LORazepam 1 MG tablet   Commonly known as: ATIVAN   Take 1 mg by mouth every 6 (six) hours as needed. For anxiety      multivitamin with minerals Tabs   Take 1 tablet by mouth daily.      nabumetone 500 MG tablet   Commonly known as: RELAFEN   Take 500 mg by mouth 2 (two) times daily.      oxyCODONE-acetaminophen 5-325 MG per tablet   Commonly known as: PERCOCET   Take 1-2 tablets by mouth every 4 (four) hours as needed for pain.      simvastatin 20 MG tablet   Commonly known as: ZOCOR   Take 20 mg by mouth every evening.      STOOL SOFTENER 100 MG capsule   Generic drug: docusate sodium   Take 100 mg by mouth daily as needed.      traMADol 50 MG tablet   Commonly known as: ULTRAM   Take 50 mg by mouth every 4 (four) hours as needed. For pain      Vitamin D2 2000 UNITS Tabs   Take 2,000 mg by mouth daily.             Signed: Hewitt Shorts, MD 09/18/2011, 8:29 AM

## 2012-03-19 HISTORY — PX: COLONOSCOPY: SHX174

## 2012-07-16 ENCOUNTER — Telehealth: Payer: Self-pay | Admitting: Obstetrics & Gynecology

## 2012-07-16 NOTE — Telephone Encounter (Signed)
60mg  qd.  #30.  RF through [facility].

## 2012-07-16 NOTE — Telephone Encounter (Signed)
Is this ok to refill until her AEX next year? Please advise, chart is in office.

## 2012-07-16 NOTE — Telephone Encounter (Signed)
Patient wants to get rx for osphena/samples worked well per patient report/[facility] [place] [phone]

## 2012-07-17 NOTE — Telephone Encounter (Signed)
rx for osphena 60 mg 1 po q d # 30/9RF called to [facility] [place]. AEX scheduled 06/08/13.

## 2012-07-22 NOTE — Telephone Encounter (Signed)
5/1 left detailed message that her rx for osphena has been called in. Aware to call in prn.

## 2012-08-15 ENCOUNTER — Other Ambulatory Visit: Payer: Self-pay | Admitting: Orthopedic Surgery

## 2012-08-15 DIAGNOSIS — M545 Low back pain, unspecified: Secondary | ICD-10-CM

## 2012-08-18 ENCOUNTER — Other Ambulatory Visit: Payer: BC Managed Care – PPO

## 2012-08-18 ENCOUNTER — Ambulatory Visit
Admission: RE | Admit: 2012-08-18 | Discharge: 2012-08-18 | Disposition: A | Discharge status: Home / Self Care | Payer: BC Managed Care – PPO | Source: Ambulatory Visit | Attending: Orthopedic Surgery | Admitting: Orthopedic Surgery

## 2012-08-18 ENCOUNTER — Other Ambulatory Visit: Payer: Self-pay | Admitting: Orthopedic Surgery

## 2012-08-18 ENCOUNTER — Ambulatory Visit: Admission: RE | Admit: 2012-08-18 | Payer: BC Managed Care – PPO | Source: Ambulatory Visit

## 2012-08-18 DIAGNOSIS — M545 Low back pain, unspecified: Secondary | ICD-10-CM

## 2012-08-27 ENCOUNTER — Telehealth: Payer: Self-pay | Admitting: Obstetrics & Gynecology

## 2012-08-27 NOTE — Telephone Encounter (Signed)
It doesn't matter when she takes the Osphena--am or pm.  The Premarin cream was to be used externally only because I wanted to see if the Osphena alone would be enough.  She can use the Premarin cream 1/2 gram intravaginally 2-3 times weekly.  She should use this regularly and then a lubricant with intercourse.

## 2012-08-27 NOTE — Telephone Encounter (Signed)
Patient says she called [facility] a couple days ago and has gotten no response. Patient is asking to speak to another nurse if possible.

## 2012-08-27 NOTE — Telephone Encounter (Signed)
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 Continuing to have pain with SA . Using Osphena  dialy and had helped but now is having painful SA. Also is u sing premarin sample given to insert vaginally but patient states is not able to insert fully into the vagina as was instructed and had discussed this with you. Questions are should she be using premarin at a time frame prior to SA and should she be taking Osphena  At a different time of day like at nite prior to having SA. Currently takes in the morning. Patient does need refill on Premarin and request to call in more than one tube at a time since copay is the same no matter how many tubes are ordered/ please advise. CB# of more private conversation is  [phone].

## 2012-08-27 NOTE — Telephone Encounter (Signed)
Patient understanding of instructions per Dr. Hyacinth Meeker. States she needs refill on premarin cream and will attempt this 2-3 x week as instructed . Please advise. On refill . See note below of reqeust to have more than one tube called in to pharmacy due to copay is the same no matter how many are dispensed.

## 2012-08-28 MED ORDER — ESTROGENS, CONJUGATED 0.625 MG/GM VA CREA: TOPICAL_CREAM | Freq: Every day | VAGINAL | Status: DC

## 2012-08-28 NOTE — Telephone Encounter (Signed)
Patient was notified that an Order for Premarin Cream was sent to Pharmacy, a note was made to pharmacist For patient to get two tubes if possible.

## 2012-08-28 NOTE — Telephone Encounter (Signed)
Order sent to pharmacy.  Made note to pharmacist for patient to get two tubes if possible.  Please let her know I did that.  Thanks.

## 2013-01-10 ENCOUNTER — Telehealth: Payer: Self-pay | Admitting: Gastroenterology

## 2013-01-10 NOTE — Telephone Encounter (Signed)
On call note. pts husband called at 40. Bravo pH monitor was beeping so he changed the battery. It seems to be working but they want to be sure. Advised him that I am not familiar with troubleshooting Bravo and I will contact endo staff this morning to call him back.

## 2013-01-16 ENCOUNTER — Encounter (INDEPENDENT_AMBULATORY_CARE_PROVIDER_SITE_OTHER): Payer: Self-pay

## 2013-01-16 ENCOUNTER — Encounter: Payer: Self-pay | Admitting: Neurology

## 2013-01-16 ENCOUNTER — Ambulatory Visit (INDEPENDENT_AMBULATORY_CARE_PROVIDER_SITE_OTHER): Payer: BC Managed Care – PPO | Admitting: Neurology

## 2013-01-16 VITALS — BP 114/73 | HR 67 | Temp 98.2°F | Ht 71.0 in | Wt 173.0 lb

## 2013-01-16 DIAGNOSIS — K056 Periodontal disease, unspecified: Secondary | ICD-10-CM | POA: Insufficient documentation

## 2013-01-16 DIAGNOSIS — K068 Other specified disorders of gingiva and edentulous alveolar ridge: Secondary | ICD-10-CM

## 2013-01-16 DIAGNOSIS — K1370 Unspecified lesions of oral mucosa: Secondary | ICD-10-CM

## 2013-01-16 HISTORY — DX: Other specified disorders of gingiva and edentulous alveolar ridge: K06.8

## 2013-01-16 NOTE — Patient Instructions (Addendum)
I am not sure what is causing your burning mouth pain.  We will do additional blood work and a try to get a brain MRI.  Please discuss with your psychiatrist the possibility about coming off of Latuda.  Go ahead a start the neurontin, you can increase every 3 days if needed.  We will call you with test results.  Follow up with your PCP.

## 2013-01-16 NOTE — Progress Notes (Signed)
Subjective:    Patient ID: Andrea Paul is a 57 y.o. female.  HPI  Huston Foley, MD, PhD [facility] [address], [address] [address] [place], [place] [address]  Dear Victorino Dike,  I saw your patient, Andrea Paul, upon your kind request in my neurologic clinic today for initial consultation of her mouth pain. The patient is unaccompanied today. As you know, Andrea Paul is a very pleasant 57 year old right-handed woman with an underlying medical history of hyperlipidemia, bipolar, reflux disease, impaired fasting glucose, hypothyroidism, anemia, vitamin D deficiency, degenerative disc disease of the neck, status post neck fusion in 7/13, who has been experiencing burning sensation in her gums in the upper and lower gums since fall of last year. Symptoms have been progressive in terms of frequency and severity. Symptoms are daily now. They go away while asleep. She does not wake up with pain. She has been evaluated by her dentist and. On test with no dental pathology found. She was tested for Sjogren's syndrome and this was negative. She had a upper GI endoscopy recently to assess for reflux and this did not show any signs of esophagitis or ulcers. She feels that the burning sensation is constant and describes a sensation like her mouth was on fire by the end of the day at which time her symptoms are worse than first thing in the morning. She has tried over-the-counter products including oral topical anesthetic agents, and has had her medication list looked at, she has tried Biotene.  She even saw a Sport and exercise psychologist at [facility], who suggested w/u for Sjogren. You started her on gabapentin and tramadol on 01/13/2013, but she has not taken any of it yet. She has checked with Dr. Nolen Mu, her psychiatrist about taking gabapentin.  She feels a burning sensation in the frontal parts of the upper and lower gums, constant, not electric shock like sensation, no rash, no swelling, no  redness, no gum disease, no new dental implants. She has no HAs, no one sided weakness, numbness, no perioral numbness, no facial numbness.  she had blood work on 11/13/12, which I reviewed: She had a normal CBC, normal CMP, slightly low vitamin D level, normal TSH, normal vitamin B12 level, and normal lipid profile. Since then she has been started on vitamin D supplementation. Of note, she is taking a number of medications. Of note the only new medication in the last 12 months has been Jordan.  Her Past Medical History Is Significant For: Past Medical History  Diagnosis Date  . Heart murmur   . Depression   . Anxiety     does not take daily unless needed  . Arthritis     bil hands  . History of blood transfusion     Her Past Surgical History Is Significant For: Past Surgical History  Procedure Laterality Date  . Tonsillectomy    . Ganglion cyst excision      right wrist  . Anterior cervical decomp/discectomy fusion  09/17/2011    Procedure: ANTERIOR CERVICAL DECOMPRESSION/DISCECTOMY FUSION 2 LEVELS;  Surgeon: Hewitt Shorts, MD;  Location: [facility];  Service: Neurosurgery;  Laterality: Bilateral;  Cervical five-six, six-seven anterior cervical decompression with fusion plating and bonegraft    Her Family History Is Significant For: No family history on file.  Her Social History Is Significant For: History   Social History  . Marital Status: Married    Spouse Name: N/A    Number of Children: N/A  . Years of Education:  N/A   Social History Main Topics  . Smoking status: Never Smoker   . Smokeless tobacco: None  . Alcohol Use: No  . Drug Use: No  . Sexual Activity:    Other Topics Concern  . None   Social History Narrative  . None    Her Allergies Are:  Allergies  Allergen Reactions  . Latex Itching  :   Her Current Medications Are:  Outpatient Encounter Prescriptions as of 01/16/2013  Medication Sig  . Acetylcarnitine HCl (ACETYL L-CARNITINE PO) Take 1  tablet by mouth daily.  Marland Kitchen aspirin 81 MG tablet Take 81 mg by mouth daily.  . B Complex-Folic Acid (B-100 BALANCED TR PO) Take 1 tablet by mouth daily.  . Calcium Carbonate-Vitamin D (CALCIUM + D PO) Take 1 tablet by mouth daily.  . Cholecalciferol (VITAMIN D3) 2000 UNITS TABS Take 1 tablet by mouth daily.  . Coenzyme Q10 (COQ10 PO) Take 1 capsule by mouth daily.  . EFAVIRENZ PO Take 2 tablets by mouth daily.  Marland Kitchen FOLATE-B12-INTRINSIC FACTOR PO Take 1 tablet by mouth daily.  Marland Kitchen lamoTRIgine (LAMICTAL) 200 MG tablet Take 200 mg by mouth at bedtime.  Marland Kitchen loratadine (CLARITIN) 10 MG tablet Take 10 mg by mouth daily.  Marland Kitchen LORazepam (ATIVAN) 1 MG tablet Take 1 mg by mouth every 6 (six) hours as needed. For anxiety  . lurasidone (LATUDA) 80 MG TABS tablet Take 80 mg by mouth daily with breakfast.  . MAGNESIUM PO Take 1 tablet by mouth daily.  . Misc Natural Products (GLUCOSAMINE CHOND COMPLEX/MSM PO) Take 2 tablets by mouth daily.  . Multiple Vitamin (MULTIVITAMIN WITH MINERALS) TABS Take 1 tablet by mouth daily.  . Ospemifene (OSPHENA) 60 MG TABS Take 1 tablet by mouth daily.  . Probiotic Product (PROBIOTIC DAILY PO) Take 1 tablet by mouth daily.  . QUEtiapine (SEROQUEL) 50 MG tablet Take 50 mg by mouth at bedtime.  . ranitidine (ZANTAC) 75 MG tablet Take 75 mg by mouth as needed for heartburn.  Marland Kitchen RESVERATROL PO Take 1 tablet by mouth daily.  . simvastatin (ZOCOR) 20 MG tablet Take 20 mg by mouth every evening.  Marland Kitchen VITAMIN C, CALCIUM ASCORBATE, PO Take 1 tablet by mouth daily.  Marland Kitchen conjugated estrogens (PREMARIN) vaginal cream Place vaginally daily.  . [DISCONTINUED] docusate sodium (STOOL SOFTENER) 100 MG capsule Take 100 mg by mouth daily as needed.  . [DISCONTINUED] Ergocalciferol (VITAMIN D2) 2000 UNITS TABS Take 2,000 mg by mouth daily.  . [DISCONTINUED] escitalopram (LEXAPRO) 10 MG tablet Take 15 mg by mouth daily.  . [DISCONTINUED] nabumetone (RELAFEN) 500 MG tablet Take 500 mg by mouth 2 (two) times  daily.  . [DISCONTINUED] traMADol (ULTRAM) 50 MG tablet Take 50 mg by mouth every 4 (four) hours as needed. For pain   Review of Systems:  Out of a complete 14 point review of systems, all are reviewed and negative with the exception of these symptoms as listed below: Mouth/gum pain Review of Systems  Constitutional: Negative.   HENT: Negative.   Eyes: Negative.   Respiratory: Negative.   Cardiovascular: Negative.   Gastrointestinal: Negative.   Endocrine: Negative.   Genitourinary: Negative.   Musculoskeletal: Negative.   Skin: Negative.   Allergic/Immunologic: Positive for environmental allergies.  Neurological: Negative.   Hematological: Negative.   Psychiatric/Behavioral: Positive for dysphoric mood. The patient is nervous/anxious.     Objective:  Neurologic Exam  Physical Exam Physical Examination:   Filed Vitals:   01/16/13 0842  BP: 114/73  Pulse: 67  Temp: 98.2 F (36.8 C)    General Examination: The patient is a very pleasant 57 y.o. female in no acute distress. She appears well-developed and well-nourished and well groomed. She is anxious and tearful at times.   HEENT: Normocephalic, atraumatic, pupils are equal, round and reactive to light and accommodation. Funduscopic exam is normal with sharp disc margins noted. Visual fields are full by finger perimetry. Extraocular tracking is good without limitation to gaze excursion or nystagmus noted. Normal smooth pursuit is noted. Hearing is grossly intact. Tympanic membranes are clear bilaterally. Face is symmetric with normal facial animation and normal facial sensation. Speech is clear with no dysarthria noted. There is no hypophonia. There is no lip, neck/head, jaw or voice tremor. Neck is supple with full range of passive and active motion. There are no carotid bruits on auscultation. Oropharynx exam reveals: mild mouth dryness, good dental hygiene and mild airway crowding. Mallampati is class II. Tongue protrudes  centrally and palate elevates symmetrically. She has no mouth lesions, no erythema, no gum swelling, no obvious periodontal disease, no blisters.   Chest: Clear to auscultation without wheezing, rhonchi or crackles noted.  Heart: S1+S2+0, regular and normal without murmurs, rubs or gallops noted.   Abdomen: Soft, non-tender and non-distended with normal bowel sounds appreciated on auscultation.  Extremities: There is no pitting edema in the distal lower extremities bilaterally. Pedal pulses are intact.  Skin: Warm and dry without trophic changes noted. There are no varicose veins.  Musculoskeletal: exam reveals no obvious joint deformities, tenderness or joint swelling or erythema.   Neurologically:  Mental status: The patient is awake, alert and oriented in all 4 spheres. Her memory, attention, language and knowledge are appropriate. There is no aphasia, agnosia, apraxia or anomia. Speech is clear with normal prosody and enunciation. There is no evidence of hypophonia or dysarthria. Thought process is linear. Mood is congruent and affect is normal.  Cranial nerves are as described above under HEENT exam. In addition, shoulder shrug is normal with equal shoulder height noted. Motor exam: Normal bulk, strength and tone is noted. There is no drift, tremor or rebound. Romberg is negative. Reflexes are 2+ throughout. Toes are downgoing bilaterally. Fine motor skills are intact with normal finger taps, normal hand movements, normal rapid alternating patting, normal foot taps and normal foot agility.  Cerebellar testing shows no dysmetria or intention tremor on finger to nose testing. Heel to shin is unremarkable bilaterally. There is no truncal or gait ataxia.  Sensory exam is intact to light touch, pinprick, vibration, temperature sense and proprioception in the upper and lower extremities.  Gait, station and balance are unremarkable. No veering to one side is noted. No leaning to one side is noted.  Posture is age-appropriate and stance is narrow based. No problems turning are noted. She turns en bloc. Tandem walk is unremarkable. Intact toe and heel stance is noted.               Assessment and Plan:   In summary, Andrea Paul is a very pleasant 57 y.o.-year old female with a history of hyperlipidemia, bipolar, reflux disease, impaired fasting glucose, hypothyroidism, anemia, vitamin D deficiency, degenerative disc disease of the neck, status post neck fusion in 7/13, who has been experiencing burning sensation in her gums in the upper and lower gums since fall of last year. Her exam is completely nonfocal, in particular her mouth exam and facial exam is completely normal. I am puzzled by her presentation.  I am not sure how to tie this all in together. I told her that we could go ahead and do a brain MRI which may very well be normal. Furthermore I would like to add additional blood work for inflammatory and autoimmune markers as well as HSV, EBV and Lyme disease. Symptomatically she is encouraged to try the gabapentin you prescribed. She can increase it every 3 days if needed. We talked about potential side effects of gabapentin. I am not sure that I will be of additional help seeing her down the Road. We will certainly call her with her test results and if there is any abnormality that needs followup I will see her back and discuss this with her. At this juncture however, I suggested that she followup with me if needed. Why she would have such a localized pain in the frontal part of her gum is unclear to me. I did ask her to discuss with her psychiatrist however to perhaps come off of the Latuda, which is the only medication that was started within the last year. This may be difficult for her because she may have a flare up and her bipolar symptoms. She has been struggling with residual anxiety and depression. She had a bravo unit placed when she had her EGD and that needs to be excluded before she  can have the MRI. Therefore I also ordered a KUB x-ray.   I answered all their questions today, with the exception that I did not have an answer as to the etiology and nature of her problem unfortunately. Nevertheless, at this time we will do more testing and call her back with the results. If all results are unremarkable I will see her back only if needed.  Most of my 60 minute visit today was spent in counseling and coordination of care, reviewing test results and reviewing medications.  Thank you very much for allowing me to participate in the care of this nice patient. If I can be of any further assistance to you please do not hesitate to call me at [phone].  Sincerely,   Huston Foley, MD, PhD

## 2013-01-20 LAB — ANA W/REFLEX: Anti Nuclear Antibody(ANA): NEGATIVE

## 2013-01-20 LAB — HSV(HERPES SIMPLEX VRS) I + II AB-IGG: HAV 1 IGG,TYPE SPECIFIC AB: <0.91 index (ref 0.00–0.90)

## 2013-01-20 LAB — EBV, CHRONIC/ACTIVE INFECTION: EBV AB VCA, IGG: 149 U/mL — ABNORMAL HIGH (ref 0.0–17.9)

## 2013-01-20 LAB — HEAVY METALS PROFILE II, BLOOD: Lead, Blood: 1 ug/dL (ref 0–19)

## 2013-01-20 LAB — VITAMIN B6

## 2013-01-20 LAB — LYME, TOTAL AB TEST/REFLEX: Lyme IgG/IgM Ab: <0.91 {ISR} (ref 0.00–0.90)

## 2013-01-20 LAB — C-REACTIVE PROTEIN: CRP: 1.1 mg/L (ref 0.0–4.9)

## 2013-01-20 NOTE — Progress Notes (Signed)
Quick Note:  Please call and advise the patient that the recent labs we checked were within normal limits, but there is indication that she had a past infection with Ebstein Teola Bradley, or infectious mononucleosis. We checked B6, Lyme titer, heavy metals, autoimmune and inflammatory markers, all of which were unremarkable. No further action is required on these tests at this time. Please remind patient to keep any upcoming appointments or tests and to call us with any interim questions, concerns, problems or updates. Thanks,  Huston Foley, MD, PhD    ______

## 2013-01-21 ENCOUNTER — Encounter: Payer: Self-pay | Admitting: *Deleted

## 2013-01-21 NOTE — Progress Notes (Signed)
Quick Note:  I called and relayed the results to pt. She verbalized understanding. I mailed copy to her. ______

## 2013-01-26 ENCOUNTER — Other Ambulatory Visit: Payer: Self-pay | Admitting: Neurology

## 2013-01-27 ENCOUNTER — Telehealth: Payer: Self-pay | Admitting: *Deleted

## 2013-01-27 DIAGNOSIS — IMO0002 Reserved for concepts with insufficient information to code with codable children: Secondary | ICD-10-CM

## 2013-01-27 NOTE — Telephone Encounter (Signed)
Calling about the order for KUB/UGI.  Andrea Paul stated that the bravo unit or micro chip used for pts endoscopy would be better viewed with the one view for Cspine, chest and abd per the radiologist.  I spoke to Dr. Frances Furbish and she said ok.   Orders placed.

## 2013-02-03 ENCOUNTER — Telehealth: Payer: Self-pay | Admitting: Neurology

## 2013-02-05 ENCOUNTER — Telehealth: Payer: Self-pay | Admitting: *Deleted

## 2013-02-05 NOTE — Telephone Encounter (Signed)
I called and spoke to [place], husband.  Relayed the information below.   F/U for pain management with pcp.  (gabapentin and tramadol).  She will do the insurance appeal today.  Hopefully will be approved.  Then once done will call with results. He verbalized understanding.

## 2013-02-05 NOTE — Telephone Encounter (Signed)
Please ask patient to followup with her primary care physician regarding symptomatic treatment of her pain. We can certainly go ahead and set up a peer to peer for her brain MRI.

## 2013-02-05 NOTE — Telephone Encounter (Signed)
Pts husband called.  The gabapentin 600mg  po qhs is not working.  She is asking about increasing this.  (taking at night or thru out day better?).  Is tramadol an option?  (per pharmacist is ok with other bipolar meds).  Pain constant but accelerates during and is worse at end of day.  (burning area top and bottom, prior molars and front).  Has appt with Dr. Nolen Mu, psychiatry on Dec. 1 relating to latuda (although pharmacy did not see problem).  [facility] [place], [id number].  Also MRI peer to peer for insurance approval needed.  Xrays to be done prior to MRI, once this approved.

## 2013-02-16 ENCOUNTER — Telehealth: Payer: Self-pay | Admitting: *Deleted

## 2013-02-16 NOTE — Telephone Encounter (Signed)
Please call in Xanax for MRI. 0.5 mg strength once on call to MRI, may repeat one time, #2 with no refills. thx

## 2013-02-17 NOTE — Telephone Encounter (Signed)
I called [facility].  Left Rx on Dr Glennon Hamilton VM.

## 2013-02-18 ENCOUNTER — Ambulatory Visit
Admission: RE | Admit: 2013-02-18 | Discharge: 2013-02-18 | Disposition: A | Discharge status: Home / Self Care | Payer: BC Managed Care – PPO | Source: Ambulatory Visit | Attending: Neurology | Admitting: Neurology

## 2013-02-18 ENCOUNTER — Ambulatory Visit (INDEPENDENT_AMBULATORY_CARE_PROVIDER_SITE_OTHER): Payer: BC Managed Care – PPO

## 2013-02-18 DIAGNOSIS — IMO0002 Reserved for concepts with insufficient information to code with codable children: Secondary | ICD-10-CM

## 2013-02-18 DIAGNOSIS — K068 Other specified disorders of gingiva and edentulous alveolar ridge: Secondary | ICD-10-CM

## 2013-02-18 DIAGNOSIS — K056 Periodontal disease, unspecified: Secondary | ICD-10-CM

## 2013-02-18 DIAGNOSIS — K1370 Unspecified lesions of oral mucosa: Secondary | ICD-10-CM

## 2013-02-18 MED ORDER — GADOPENTETATE DIMEGLUMINE 469.01 MG/ML IV SOLN: 16.0000 mL | Freq: Once | INTRAVENOUS | Status: AC | PRN

## 2013-02-20 NOTE — Progress Notes (Addendum)
Quick Note:  Please call patient regarding the recent brain MRI: The brain scan showed a normal structure of the brain and no significant volume loss which we call atrophy. There were changes in the deeper structures of the brain, which we call white matter changes or microvascular changes. These were reported as minimal and very few in Her case. These are tiny white spots, that occur with time and are seen in a variety of conditions, including with normal aging, chronic hypertension, chronic headaches, especially migraine HAs, chronic diabetes, chronic hyperlipidemia. These are not strokes and no mass or lesion or contrast enhancement was seen which is reassuring. Again, there were no acute findings, such as a stroke, or mass or blood products. No further action is required on this test at this time, other than re-enforcing the importance of good blood pressure control, good cholesterol control, good blood sugar control, and weight management. Please remind patient to keep any upcoming appointments or tests and to call us with any interim questions, concerns, problems or updates. Thanks,  Huston Foley, MD, PhD   Please call patient back and advise her that I'm not sure how else to help her. Perhaps she can see an oral surgeon? I am afraid that I am not going to be able to add anything else to the workup or treatment.

## 2013-02-23 ENCOUNTER — Telehealth: Payer: Self-pay | Admitting: Neurology

## 2013-02-23 NOTE — Telephone Encounter (Signed)
Patient has been scheduled per her request for next step going forward

## 2013-02-23 NOTE — Telephone Encounter (Signed)
Patient called wanting to schedule an appointment with Dr. Frances Furbish to discuss her MRI results and to discuss the next steps. Patient states that if she does not answer, she has given permission for her husband to speak with someone about scheduling an appointment. Please call.

## 2013-02-23 NOTE — Telephone Encounter (Signed)
I called and spoke to the patient about her MRI results. She was requesting another appointment to discuss, where to go from here. I had to be very upfront and open with her regarding not knowing where to send her for here. During her first visit her exam is nonfocal neurologically with the exception of her subjective complaint of gum numbness. She had an MRI brain which was normal. I honestly had to tell her that I did not know where to go from here and that I would recommend that she meet again with her primary care physician for perhaps seeking second opinion with a dentist or oral surgeon. I explained to her that even if we were to meet and a followup appointment I would not know how to help her and I would like to save an appointment for her to tell her just that. She understood.

## 2013-02-24 ENCOUNTER — Ambulatory Visit: Payer: Self-pay | Admitting: Neurology

## 2013-03-24 ENCOUNTER — Encounter (INDEPENDENT_AMBULATORY_CARE_PROVIDER_SITE_OTHER): Payer: Self-pay

## 2013-03-24 ENCOUNTER — Ambulatory Visit (INDEPENDENT_AMBULATORY_CARE_PROVIDER_SITE_OTHER): Payer: BC Managed Care – PPO | Admitting: Psychiatry

## 2013-03-24 ENCOUNTER — Encounter (HOSPITAL_COMMUNITY): Payer: Self-pay | Admitting: Psychiatry

## 2013-03-24 VITALS — BP 112/66 | HR 70 | Wt 175.0 lb

## 2013-03-24 DIAGNOSIS — F319 Bipolar disorder, unspecified: Secondary | ICD-10-CM

## 2013-03-24 DIAGNOSIS — F314 Bipolar disorder, current episode depressed, severe, without psychotic features: Secondary | ICD-10-CM

## 2013-03-24 MED ORDER — ARIPIPRAZOLE 5 MG PO TABS
ORAL_TABLET | ORAL | Status: DC
Start: 2013-03-24 — End: 2013-04-27

## 2013-03-24 MED ORDER — LURASIDONE HCL 20 MG PO TABS: ORAL_TABLET | ORAL | Status: DC

## 2013-03-24 MED ORDER — LURASIDONE HCL 20 MG PO TABS: 80.0000 mg | ORAL_TABLET | Freq: Every day | ORAL | Status: DC
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
## 2013-03-25 DIAGNOSIS — F314 Bipolar disorder, current episode depressed, severe, without psychotic features: Secondary | ICD-10-CM | POA: Insufficient documentation

## 2013-03-25 DIAGNOSIS — F3181 Bipolar II disorder: Secondary | ICD-10-CM | POA: Insufficient documentation

## 2013-03-27 ENCOUNTER — Telehealth: Payer: Self-pay | Admitting: Obstetrics & Gynecology

## 2013-03-27 NOTE — Telephone Encounter (Signed)
Spoke with pt who reports the osphena has been helping somewhat, and pt has been trying to use premarin cream and lubricants as well. Still having some dryness because pt cannot get the applicator for the premarin cream inserted far enough. Pt reports she has always been unable to use tampons, even in her teens and twenties, because it was too uncomfortable to try to insert the applicator. Pt is only able to get the premarin cream near the vaginal opening. She is wondering if this is sufficient enough to do any good?

## 2013-03-27 NOTE — Telephone Encounter (Signed)
Pt wants to talk with Dr. Sabra Heck. She states she is having some difficulties with premarin.

## 2013-03-30 NOTE — Telephone Encounter (Signed)
LMTCB

## 2013-03-30 NOTE — Telephone Encounter (Signed)
Even just external premarin cream will help.  If the dryness feels better, just keep doing what she's doing.

## 2013-04-01 NOTE — Telephone Encounter (Signed)
Message from Dr. Loreta Ave given to patient and patient verbalized understanding will f/u prn.

## 2013-04-02 ENCOUNTER — Telehealth (HOSPITAL_COMMUNITY): Payer: Self-pay

## 2013-04-02 NOTE — Telephone Encounter (Signed)
First attempt to call the patient. Left message, stating I would call the patient back in the morning.

## 2013-04-03 ENCOUNTER — Telehealth (HOSPITAL_COMMUNITY): Payer: Self-pay | Admitting: Psychiatry

## 2013-04-03 NOTE — Telephone Encounter (Signed)
The patient reports that she been very moody.  Patient denies any suicidal or homicidal thoughts.  She reports she continues to take therapy. She is tolerating the dose change. The patient will continue to taper medications.

## 2013-04-06 ENCOUNTER — Telehealth (HOSPITAL_COMMUNITY): Payer: Self-pay

## 2013-04-06 NOTE — Telephone Encounter (Signed)
Called patient. Acknowledged change of dose.

## 2013-04-14 ENCOUNTER — Telehealth (HOSPITAL_COMMUNITY): Payer: Self-pay

## 2013-04-16 NOTE — Telephone Encounter (Signed)
Called patient. She reports she is improved. She is seeing her therapist weekly.  She is taking  20 mg of Latuda and 15 mg daily and 20 mg of Abilify.  She denies SI/HI/AVH.  She reports that she has some sedation, possibly with neurontin.

## 2013-04-27 ENCOUNTER — Ambulatory Visit (INDEPENDENT_AMBULATORY_CARE_PROVIDER_SITE_OTHER): Payer: BC Managed Care – PPO | Admitting: Psychiatry

## 2013-04-27 ENCOUNTER — Encounter (HOSPITAL_COMMUNITY): Payer: Self-pay | Admitting: Psychiatry

## 2013-04-27 ENCOUNTER — Encounter (INDEPENDENT_AMBULATORY_CARE_PROVIDER_SITE_OTHER): Payer: Self-pay

## 2013-04-27 VITALS — BP 102/67 | HR 82 | Wt 164.0 lb

## 2013-04-27 DIAGNOSIS — F314 Bipolar disorder, current episode depressed, severe, without psychotic features: Secondary | ICD-10-CM

## 2013-04-27 DIAGNOSIS — F319 Bipolar disorder, unspecified: Secondary | ICD-10-CM

## 2013-04-27 MED ORDER — ARIPIPRAZOLE 20 MG PO TABS: 20.0000 mg | ORAL_TABLET | Freq: Every day | ORAL | Status: DC
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
## 2013-05-18 ENCOUNTER — Other Ambulatory Visit: Payer: Self-pay | Admitting: Obstetrics & Gynecology

## 2013-05-18 NOTE — Telephone Encounter (Signed)
Last refilled: AEX 06/02/12  #6 With samples Last AEX: 06/02/12  AEX Scheduled: 06/08/13 with Dr. Sabra Heck Last Mammogram: 11/20/12 (In Epic) Bi-Rads 2  Okay to refill?  Please Advise.  (Chart In Estate agent)

## 2013-06-01 ENCOUNTER — Telehealth (HOSPITAL_COMMUNITY): Payer: Self-pay

## 2013-06-01 NOTE — Telephone Encounter (Signed)
Patient has received a 90 day supply of Lamictal on April 30, 2013 according to the pharmacy. She has no refills. Will see patient on April 12th prior to her running out an determine need for dose adjustment.

## 2013-06-02 ENCOUNTER — Other Ambulatory Visit (HOSPITAL_COMMUNITY): Payer: Self-pay | Admitting: Psychiatry

## 2013-06-03 ENCOUNTER — Telehealth (HOSPITAL_COMMUNITY): Payer: Self-pay | Admitting: Psychiatry

## 2013-06-03 MED ORDER — BUSPIRONE HCL 5 MG PO TABS: 5.0000 mg | ORAL_TABLET | Freq: Three times a day (TID) | ORAL | Status: DC

## 2013-06-03 NOTE — Telephone Encounter (Signed)
Trial of Buspirone 5 mg and increase to 5 mg TID.

## 2013-06-08 ENCOUNTER — Ambulatory Visit: Payer: Self-pay | Admitting: Obstetrics & Gynecology

## 2013-06-08 ENCOUNTER — Ambulatory Visit (HOSPITAL_COMMUNITY): Payer: Self-pay | Admitting: Psychiatry

## 2013-06-16 ENCOUNTER — Other Ambulatory Visit (HOSPITAL_COMMUNITY): Payer: Self-pay | Admitting: Psychiatry

## 2013-06-18 ENCOUNTER — Telehealth (HOSPITAL_COMMUNITY): Payer: Self-pay

## 2013-06-19 NOTE — Telephone Encounter (Signed)
Called patient. She was informed of refill authorization. 11. Patient informed that April 15th, 2015 would be my last day at this clinic.

## 2013-07-01 ENCOUNTER — Encounter (HOSPITAL_COMMUNITY): Payer: Self-pay | Admitting: Psychiatry

## 2013-07-01 ENCOUNTER — Ambulatory Visit (INDEPENDENT_AMBULATORY_CARE_PROVIDER_SITE_OTHER): Payer: BC Managed Care – PPO | Admitting: Psychiatry

## 2013-07-01 ENCOUNTER — Encounter (INDEPENDENT_AMBULATORY_CARE_PROVIDER_SITE_OTHER): Payer: Self-pay

## 2013-07-01 VITALS — BP 114/70 | HR 63 | Wt 173.0 lb

## 2013-07-01 DIAGNOSIS — F319 Bipolar disorder, unspecified: Secondary | ICD-10-CM

## 2013-07-01 DIAGNOSIS — F314 Bipolar disorder, current episode depressed, severe, without psychotic features: Secondary | ICD-10-CM

## 2013-07-01 MED ORDER — ARIPIPRAZOLE 20 MG PO TABS: 20.0000 mg | ORAL_TABLET | Freq: Every day | ORAL | Status: DC

## 2013-07-01 MED ORDER — BUSPIRONE HCL 10 MG PO TABS: 5.0000 mg | ORAL_TABLET | Freq: Three times a day (TID) | ORAL | Status: DC

## 2013-07-01 MED ORDER — CLONAZEPAM 1 MG PO TABS: 1.0000 mg | ORAL_TABLET | ORAL | Status: DC

## 2013-07-01 MED ORDER — LAMOTRIGINE 150 MG PO TABS: 300.0000 mg | ORAL_TABLET | Freq: Every day | ORAL | Status: DC
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
## 2013-07-03 ENCOUNTER — Encounter: Payer: Self-pay | Admitting: Obstetrics & Gynecology

## 2013-07-06 ENCOUNTER — Ambulatory Visit (HOSPITAL_BASED_OUTPATIENT_CLINIC_OR_DEPARTMENT_OTHER): Payer: BC Managed Care – PPO | Attending: Family Medicine | Admitting: Radiology

## 2013-07-06 ENCOUNTER — Ambulatory Visit: Payer: Self-pay | Admitting: Obstetrics & Gynecology

## 2013-07-06 VITALS — Ht 70.0 in | Wt 172.0 lb

## 2013-07-06 DIAGNOSIS — G4733 Obstructive sleep apnea (adult) (pediatric): Secondary | ICD-10-CM | POA: Insufficient documentation

## 2013-07-11 NOTE — Sleep Study (Signed)
   NAME: Andrea Paul DATE OF BIRTH:  10-Mar-1956 MEDICAL RECORD NUMBER [id number]  LOCATION: [facility]  PHYSICIAN: Clinton D Young  DATE OF STUDY: 07/06/2013  SLEEP STUDY TYPE: Nocturnal Polysomnogram               REFERRING PHYSICIAN: Carlos Levering, PA-C  INDICATION FOR STUDY: Hypersomnia with sleep apnea  EPWORTH SLEEPINESS SCORE:   12/24 HEIGHT: 5\' 10"  (177.8 cm)  WEIGHT: 172 lb (78.019 kg)    Body mass index is 24.68 kg/(m^2).  NECK SIZE: 13 in.  MEDICATIONS: Charted for review  SLEEP ARCHITECTURE: Total sleep time 329.5 minutes with sleep efficiency 84.8%. Stage I was 12.6%, stage II 77.4%, stage III absent, REM 10% of total sleep time. Sleep latency 13 minutes, REM latency 175.5 minutes, awake after sleep onset 46 minutes, arousal index 15.1. Bedtime medication: BuSpar, Lyrica, Abilify, Osphena, Lamotrigine, simvastatin, vitamin D,Xyzal, aspirin, probiotic, clonazepam  RESPIRATORY DATA: Apnea hypopneas index (AHI) 14.9 per hour. 82 total events scored including 27 obstructive apneas, 3 central apneas, 3 mixed apneas, 49 hypopneas. Most events were while supine. REM AHI 40 per hour. There were not enough early events to meet protocol requirements for split CPAP titration.  OXYGEN DATA: Moderate snoring with oxygen desaturation to a nadir of 79% and mean oxygen saturation through the study of 93.8% on room air.  CARDIAC DATA: Normal sinus rhythm  MOVEMENT/PARASOMNIA: A few incidental limb jerks were noted with little effect on sleep. Bathroom x1  IMPRESSION/ RECOMMENDATION:   1) Mild to moderate obstructive sleep apnea/hypopnea syndrome, AHI of 14.9 per hour with mostly supine events. REM AHI 40 per hour area moderate snoring with oxygen desaturation to a nadir of 79% and mean oxygen saturation through the study of 93.8% on room air.  2) There were not enough early events to meet her to call requirements for split CPAP titration on this study. This  patient can return for a dedicated CPAP titration study if appropriate.  Signed Baird Lyons M.D. [place], Tax adviser of Sleep Medicine  ELECTRONICALLY SIGNED ON:  07/11/2013, 11:09 AM [place] PH: [phone]   FX: [phone] [place]

## 2013-07-13 ENCOUNTER — Encounter: Payer: Self-pay | Admitting: Obstetrics & Gynecology

## 2013-07-13 ENCOUNTER — Ambulatory Visit (INDEPENDENT_AMBULATORY_CARE_PROVIDER_SITE_OTHER): Payer: BC Managed Care – PPO | Admitting: Obstetrics & Gynecology

## 2013-07-13 VITALS — BP 100/60 | HR 60 | Resp 16 | Ht 70.25 in | Wt 169.8 lb

## 2013-07-13 DIAGNOSIS — N952 Postmenopausal atrophic vaginitis: Secondary | ICD-10-CM

## 2013-07-13 DIAGNOSIS — IMO0002 Reserved for concepts with insufficient information to code with codable children: Secondary | ICD-10-CM

## 2013-07-13 DIAGNOSIS — Z01419 Encounter for gynecological examination (general) (routine) without abnormal findings: Secondary | ICD-10-CM

## 2013-07-13 DIAGNOSIS — Z124 Encounter for screening for malignant neoplasm of cervix: Secondary | ICD-10-CM

## 2013-07-13 DIAGNOSIS — Z0000 Encounter for general adult medical examination without abnormal findings: Secondary | ICD-10-CM

## 2013-07-13 LAB — POCT URINALYSIS DIPSTICK
Bilirubin, UA: NEGATIVE
Blood, UA: NEGATIVE
GLUCOSE UA: NEGATIVE
Ketones, UA: NEGATIVE
LEUKOCYTES UA: NEGATIVE
Nitrite, UA: NEGATIVE
PROTEIN UA: NEGATIVE
UROBILINOGEN UA: NEGATIVE
pH, UA: 7

## 2013-07-13 MED ORDER — ESTROGENS, CONJUGATED 0.625 MG/GM VA CREA: TOPICAL_CREAM | VAGINAL | Status: DC

## 2013-07-13 MED ORDER — OSPEMIFENE 60 MG PO TABS: ORAL_TABLET | ORAL | Status: DC

## 2013-07-13 NOTE — Progress Notes (Signed)
58 y.o. G0P0000 MarriedCaucasianF here for annual exam.  Not using Premarin.  Has no desire to be sexually active which she feels is due to pain with intercourse.  Having some clear discharge with some odor.  Wearing a panty liner.  Has been going on for about three months.  Hasn't tried any OTC products.   Has a good relationship with her therapist who has given her a lot of suggestions.    Pt reports troubles with inserting any vaginal "product".  Has trouble with premarin cream.  Has used externally but when she places an applicator vaginally, the begins to feel some pain and then just "cannot" get it to go any further.  She recognizes this is a mental block for her because I am capable of doing a vaginal exam on her without issue or pain.  She tried HRT a couple of hears ago and had significant bleeding.  Wonders if she could try this again.  I feel she does not need the risks of systemic estrogen and would much prefer her to use vaginal estrogen alone.  I really think if she could just regularly use vaginal estrogen, then her problem would be much improved.  Pt is tearful about this.  Also discussed with pt possible use of dilators but, of course, this would depend on being able to insert a product vaginally as well.  Has used osphena with some moderate success but she still has dryness and discomfort and this prevents her from having an enjoyable sexual experience.  Patient's last menstrual period was 10/18/2010.          Sexually active: no  The current method of family planning is none.    Exercising: yes  strength training and cardio Smoker:  no  Health Maintenance: Pap:  05/02/11 WNL/negative HR HPV History of abnormal Pap:  no MMG:  11/05/12 3D-normal Colonoscopy:  10/14, Dr. Collene Mares.  F/u 10 years BMD:   none TDaP:  2013 Screening Labs: PCP, Hb today: PCP, Urine today: PH-7.0   reports that she has never smoked. She has never used smokeless tobacco. She reports that she does not drink  alcohol or use illicit drugs.  Past Medical History  Diagnosis Date  . Heart murmur   . Depression     bipolar d/o  . Anxiety     does not take daily unless needed  . Arthritis     bil hands  . History of blood transfusion   . Pain in gums 01/16/2013  . Basal cell carcinoma   . History of periodontal disease   . 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 Tonsillectomy    . Ganglion cyst excision      right wrist  . Anterior cervical decomp/discectomy fusion  09/17/2011    Procedure: ANTERIOR CERVICAL DECOMPRESSION/DISCECTOMY FUSION 2 LEVELS;  Surgeon: Hosie Spangle, MD;  Location: [facility];  Service: Neurosurgery;  Laterality: Bilateral;  Cervical five-six, six-seven anterior cervical decompression with fusion plating and bonegraft  . Basal cell carcinoma excision      Current Outpatient Prescriptions  Medication Sig Dispense Refill  . Acetylcarnitine HCl (ACETYL L-CARNITINE PO) Take 1 tablet by mouth daily.      . ARIPiprazole (ABILIFY) 20 MG tablet Take 1 tablet (20 mg total) by mouth at bedtime.  30 tablet  2  . aspirin 81 MG tablet Take 81 mg by mouth daily.      Marland Kitchen  B Complex-Folic Acid (O-350 BALANCED TR PO) Take 1 tablet by mouth daily.      . busPIRone (BUSPAR) 10 MG tablet Take 0.5 tablets (5 mg total) by mouth 3 (three) times daily.  90 tablet  2  . Calcium Carbonate-Vitamin D (CALCIUM + D PO) Take 1 tablet by mouth daily.      . clonazePAM (KLONOPIN) 1 MG tablet Take 1 tablet (1 mg total) by mouth as directed. 1 mg in the afternoon, and 1/2 mg at bedtime.  45 tablet  0  . Coenzyme Q10 (COQ10 PO) Take 1 capsule by mouth daily.      Marland Kitchen FOLATE-B12-INTRINSIC FACTOR PO Take 1 tablet by mouth daily.      Marland Kitchen lamoTRIgine (LAMICTAL) 150 MG tablet Take 2 tablets (300 mg total) by mouth daily.  60 tablet  2  . levocetirizine (XYZAL) 5 MG tablet Take 5 mg by mouth every evening.      Marland Kitchen MAGNESIUM PO Take 1 tablet by mouth  daily.      . Misc Natural Products (GLUCOSAMINE CHOND COMPLEX/MSM PO) Take 2 tablets by mouth daily.      . Multiple Vitamin (MULTIVITAMIN WITH MINERALS) TABS Take 1 tablet by mouth daily.      . Omega-3 Fatty Acids (FISH OIL PO) Take by mouth.      . OSPHENA 60 MG TABS TAKE 1 TABLET BY MOUTH EVERY DAY  30 tablet  9  . pregabalin (LYRICA) 50 MG capsule Take 100 mg by mouth 3 (three) times daily.       . Probiotic Product (PROBIOTIC DAILY PO) Take 1 tablet by mouth daily.      Marland Kitchen RESVERATROL PO Take 1 tablet by mouth daily.      . simvastatin (ZOCOR) 20 MG tablet Take 20 mg by mouth every evening.      Marland Kitchen VITAMIN C, CALCIUM ASCORBATE, PO Take 1 tablet by mouth daily.      . Vitamin D, Ergocalciferol, (DRISDOL) 50000 UNITS CAPS capsule Take 50,000 Units by mouth 2 (two) times a week.      . conjugated estrogens (PREMARIN) vaginal cream Place vaginally daily.  30 g  1  . ranitidine (ZANTAC) 75 MG tablet Take 75 mg by mouth as needed for heartburn.       No current facility-administered medications for this visit.    Family History  Problem Relation Age of Onset  . Dementia Mother 39  . Alcohol abuse Father   . Anxiety disorder Father   . Depression Father   . Dementia Father 69  . Paranoid behavior Father   . CAD Father   . Depression Sister   . Alcohol abuse Brother   . Bipolar disorder Brother   . Drug abuse Brother   . Sexual abuse Paternal Grandfather   . OCD Neg Hx   . Schizophrenia Neg Hx   . Thyroid disease Neg Hx   . Depression Brother   . Depression Sister   . Rheum arthritis Mother     ROS:  Pertinent items are noted in HPI.  Otherwise, a comprehensive ROS was negative.  Exam:   BP 100/60  Pulse 60  Resp 16  Ht 5' 10.25" (1.784 m)  Wt 169 lb 12.8 oz (77.021 kg)  BMI 24.20 kg/m2  LMP 10/18/2010     Height: 5' 10.25" (178.4 cm)  Ht Readings from Last 3 Encounters:  07/13/13 5' 10.25" (1.784 m)  07/06/13 5\' 10"  (1.778 m)  01/16/13 5\' 11"  (1.803  m)    General  appearance: alert, cooperative and appears stated age Head: Normocephalic, without obvious abnormality, atraumatic Neck: no adenopathy, supple, symmetrical, trachea midline and thyroid normal to inspection and palpation Lungs: clear to auscultation bilaterally Breasts: normal appearance, no masses or tenderness Heart: regular rate and rhythm Abdomen: soft, non-tender; bowel sounds normal; no masses,  no organomegaly Extremities: extremities normal, atraumatic, no cyanosis or edema Skin: Skin color, texture, turgor normal. No rashes or lesions Lymph nodes: Cervical, supraclavicular, and axillary nodes normal. No abnormal inguinal nodes palpated Neurologic: Grossly normal   Pelvic: External genitalia:  no lesions              Urethra:  normal appearing urethra with no masses, tenderness or lesions              Bartholins and Skenes: normal                 Vagina: normal appearing vagina with normal color and discharge, no lesions              Cervix: no lesions              Pap taken: yes Bimanual Exam:  Uterus:  normal size, contour, position, consistency, mobility, non-tender              Adnexa: normal adnexa and no mass, fullness, tenderness               Rectovaginal: Confirms               Anus:  normal sphincter tone, no lesions  A:  Well Woman with normal exam Vaginal atrophic changes and dyspareunia H/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  D/W pt 3D yearly due to dense breast I feel pt needs to be using vaginal estrogen products.  She has a "mental block" about inserting an applicator.  She cannot seem to get it past the introitus for fear of pain.  Would like pt to be evaluated by [name] to see if she can help with this.  If not, then pt may need referral to sex therapist. pap smear today return annually or prn  An After Visit Summary was printed and given to the patient.   In addition to her AEX and the health maintenance this involves,  about 15 additional minutes was spent on the issues of dyspareunia and atrophic changes.

## 2013-07-13 NOTE — Patient Instructions (Addendum)

## 2013-07-15 ENCOUNTER — Encounter: Payer: Self-pay | Admitting: Obstetrics & Gynecology

## 2013-07-17 ENCOUNTER — Telehealth: Payer: Self-pay | Admitting: Obstetrics & Gynecology

## 2013-07-17 LAB — IPS PAP SMEAR ONLY

## 2013-07-17 NOTE — Telephone Encounter (Signed)
Spoke with patient. Advised referral placed and all information has been sent over to [facility]. Should hear back regarding appointment date and time early next week. Advised their office will be in contact with patient after appointment is made. Patient agreeable.  Routing to provider for final review. Patient agreeable to disposition. Will close encounter.

## 2013-07-17 NOTE — Telephone Encounter (Signed)
Patient calling to check on referral to a physical therapist. She says she was told to call if she did not hear anything about this by the end of the week.

## 2013-07-20 ENCOUNTER — Telehealth: Payer: Self-pay

## 2013-07-20 NOTE — Telephone Encounter (Signed)
lmtcb scheduled her MMG/BMD at [facility] on 11/06/13 at 11:00 and 11:30. //kn

## 2013-07-21 NOTE — Telephone Encounter (Signed)
Spoke with patient. Patient is calling regarding referral to physical therapist placed by Dr.Miller. Advised referral was placed for patient to see [name] at [facility]. Advised referral is still in process and she will be contacted with appointment date and time. Patient agreeable. Advised patient MMG/BMD scheduled at [facility] on 8/21 at 11:00 and 11:30. Patient agreeable and verbalizes understanding.  Routing to provider for final review. Patient agreeable to disposition. Will close encounter

## 2013-07-21 NOTE — Telephone Encounter (Signed)
Patient calling to see which physical therapist she was referred to?

## 2013-07-23 ENCOUNTER — Telehealth: Payer: Self-pay | Admitting: Obstetrics & Gynecology

## 2013-07-23 NOTE — Telephone Encounter (Signed)
Patient wanted to let kelly know that she has not heard from the physical therapist that dr Sabra Heck had referred her to.

## 2013-07-27 NOTE — Telephone Encounter (Signed)
Message left to return call to Tucker at [phone].   Will advise referral is pending to [facility].   Unable to reach [facility] today x 2. Per Gabriel Cirri information has been sent.

## 2013-07-27 NOTE — Telephone Encounter (Signed)
Spoke with patient and advised referral is in process and pending. Will continue to attempt to reach office of [facility].   [facility]: Appointment is made for 07/29/13 at 11:00 with arrival time 1045. Bring copy of ID and insurance card. Patient is agreeable.   Routing to provider for final review. Patient agreeable to disposition. Will close encounter

## 2013-07-28 ENCOUNTER — Telehealth: Payer: Self-pay | Admitting: Obstetrics & Gynecology

## 2013-07-28 NOTE — Telephone Encounter (Signed)
Medical records faxed to [facility] for appointment tomorrow. [phone]

## 2013-08-17 ENCOUNTER — Telehealth: Payer: Self-pay | Admitting: Obstetrics & Gynecology

## 2013-08-17 NOTE — Telephone Encounter (Signed)
Dr.Miller, patient is currently seeing [name] at [facility]. Patient has 6 week rechecked scheduled with you on 6/11 and would like to know if you would like her to complete appointments with [name] before coming in for recheck or if she should keep appointment as it is scheduled.

## 2013-08-17 NOTE — Telephone Encounter (Signed)
Yes, change appt.  Push it out another couple of months and then I can see pt again and we can discuss how she is doing and options.

## 2013-08-17 NOTE — Telephone Encounter (Signed)
Previous telephone encounter closed in error.

## 2013-08-17 NOTE — Telephone Encounter (Signed)
Patient is wanting to know if she needs to reschedule her appointment for next week since she is seeing a physical therapist that she recommended right now and she still has a few more sessions. Didn't know if she needed to wait until it was completed to come see miller. Or if she still wanted her to come in on 08/27/13

## 2013-08-18 ENCOUNTER — Ambulatory Visit (HOSPITAL_BASED_OUTPATIENT_CLINIC_OR_DEPARTMENT_OTHER): Payer: BC Managed Care – PPO | Attending: Family Medicine | Admitting: Radiology

## 2013-08-18 VITALS — Ht 70.0 in | Wt 167.0 lb

## 2013-08-18 DIAGNOSIS — G4733 Obstructive sleep apnea (adult) (pediatric): Secondary | ICD-10-CM

## 2013-08-18 DIAGNOSIS — G4730 Sleep apnea, unspecified: Principal | ICD-10-CM

## 2013-08-18 DIAGNOSIS — R0609 Other forms of dyspnea: Secondary | ICD-10-CM | POA: Insufficient documentation

## 2013-08-18 DIAGNOSIS — R0989 Other specified symptoms and signs involving the circulatory and respiratory systems: Secondary | ICD-10-CM | POA: Insufficient documentation

## 2013-08-18 DIAGNOSIS — Z9989 Dependence on other enabling machines and devices: Secondary | ICD-10-CM

## 2013-08-18 DIAGNOSIS — G4710 Hypersomnia, unspecified: Secondary | ICD-10-CM | POA: Insufficient documentation

## 2013-08-18 NOTE — Telephone Encounter (Signed)
Spoke with patient. Advised of message from Andrea Paul. Patient agreeable. Appointment rescheduled to 8/11 at 1300 with Dr.Miller. Patient agreeable to date and time.  Routing to provider for final review. Patient agreeable to disposition. Will close encounter

## 2013-08-22 DIAGNOSIS — G4730 Sleep apnea, unspecified: Secondary | ICD-10-CM

## 2013-08-22 DIAGNOSIS — G4710 Hypersomnia, unspecified: Secondary | ICD-10-CM

## 2013-08-22 DIAGNOSIS — G4733 Obstructive sleep apnea (adult) (pediatric): Secondary | ICD-10-CM

## 2013-08-22 NOTE — Sleep Study (Signed)
   NAME: Andrea Paul DATE OF BIRTH:  1955/08/30 MEDICAL RECORD NUMBER [id number]  LOCATION: [facility]  PHYSICIAN: Clinton D Young  DATE OF STUDY: 08/18/2013  SLEEP STUDY TYPE: Nocturnal Polysomnogram               REFERRING PHYSICIAN: Carlos Levering, PA-C  INDICATION FOR STUDY: Hypersomnia with sleep apnea-CPAP titration  EPWORTH SLEEPINESS SCORE:   11/24 HEIGHT: 5\' 10"  (177.8 cm)  WEIGHT: 75.751 kg (167 lb)    Body mass index is 23.96 kg/(m^2).  NECK SIZE: 13 in.  MEDICATIONS: Chart for review  SLEEP ARCHITECTURE: Total sleep time 343.5 minutes with sleep efficiency 79.9%. Stage I was 22.4%, stage II 67.1%, stage III absent, REM 10.5% of total sleep time. Sleep latency 6 minutes, REM latency 190.5 minutes, awake after sleep onset 80 minutes, arousal index 27.6. Bedtime medication: Diphen, simvastatin, aspirin, Lamictal, Abilify, clonazepam, levocetirizine  RESPIRATORY DATA: CPAP titration protocol. CPAP was titrated to 15 CWP, AHI 60 per hour. At that time the patient got up from the bathroom and had difficulty subsequently regaining sleep. She was changed to bilevel titration for final pressure inspiratory 24 and expiratory 17 CWP, AHI 12 per hour. She wore a medium fullface mask with heated humidifier.  OXYGEN DATA: Moderate snoring partially controlled by final pressures with mean oxygen saturation through the study 95.8% on room air.  CARDIAC DATA: Normal sinus rhythm  MOVEMENT/PARASOMNIA: No significant movement disturbance, bathroom x1  IMPRESSION/ RECOMMENDATION:   1) CPAP was titrated to 15 CWP with incomplete control and residual AHI 14.6 per hour. She will for the bathroom and had difficulty regaining sleep. The technician tried titrating with bilevel to final inspiratory pressure 24 and expiratory pressure 17 CWP with left residual AHI 12 per hour. She wore a medium ResMed AirFit F10 fullface mask with heated humidifier. 2) The technician  suggested considering return for a dedicated bilevel titration seeking better control with more time for adjustment. If this is not an option, then suggest home trial with bilevel 24/17. 3) It is not usual for a woman this size to require pressures in this range. Consider ENT evaluation for correctable upper airway obstruction such as nasal obstruction or large tonsils.  Signed Baird Lyons M.D. [place], Tax adviser of Sleep Medicine  ELECTRONICALLY SIGNED ON:  08/22/2013, 3:44 PM [place] PH: [phone]   FX: [phone] [place]

## 2013-08-27 ENCOUNTER — Ambulatory Visit: Payer: BC Managed Care – PPO | Admitting: Obstetrics & Gynecology

## 2013-10-16 ENCOUNTER — Telehealth: Payer: Self-pay | Admitting: Obstetrics & Gynecology

## 2013-10-16 NOTE — Telephone Encounter (Signed)
Patient called and cancelled her appointment for 10/27/13 with Dr. Sabra Heck for a recheck. She says she is still in physical therapy and is doing well but is not quite ready to see Dr. Sabra Heck yet. She will call back to reschedule at a later date. FYI only.

## 2013-10-19 ENCOUNTER — Ambulatory Visit (HOSPITAL_COMMUNITY): Payer: Self-pay | Admitting: Psychiatry

## 2013-10-21 ENCOUNTER — Ambulatory Visit (HOSPITAL_COMMUNITY): Payer: Self-pay | Admitting: Psychiatry

## 2013-10-22 ENCOUNTER — Ambulatory Visit (INDEPENDENT_AMBULATORY_CARE_PROVIDER_SITE_OTHER): Payer: BC Managed Care – PPO | Admitting: Psychiatry

## 2013-10-22 DIAGNOSIS — F411 Generalized anxiety disorder: Secondary | ICD-10-CM

## 2013-10-22 DIAGNOSIS — F314 Bipolar disorder, current episode depressed, severe, without psychotic features: Secondary | ICD-10-CM

## 2013-10-22 DIAGNOSIS — F3130 Bipolar disorder, current episode depressed, mild or moderate severity, unspecified: Secondary | ICD-10-CM

## 2013-10-22 MED ORDER — ARIPIPRAZOLE 20 MG PO TABS: 20.0000 mg | ORAL_TABLET | Freq: Every day | ORAL | Status: DC

## 2013-10-22 MED ORDER — LAMOTRIGINE 150 MG PO TABS: 300.0000 mg | ORAL_TABLET | Freq: Every day | ORAL | Status: AC

## 2013-10-22 NOTE — Progress Notes (Signed)
Patient ID: Andrea Paul, female   DOB: July 13, 1955, 58 y.o.   MRN: [id number] [place] Follow-up Outpatient Visit  Quaneisha Hanisch [id number] 58 y.o.  10/22/2013  Chief Complaint: follow up, anxiety and bipolar.      History of Present Illness:   Patient returns for Medication Follow up and is diagnosed with bipolar disorder. Also suffers from anxiety. Patient has been doing reasonable to current medication prescriptions including Abilify and Lamictal. She takes Klonopin half cut it down. She also takes both bath also cut down the dose. She mostly stays at home keeps herself busy. Sleep energy appetite is reasonable.  She follow the sleep physician and is now on tennis T-shirt for her sleep apnea. Duration of illness is 15 to 16 years. Has been on multiple were different medication in the past but this condition has been helpful.  Severity of bipolar. 6/10. 10 being no depression or significant mood swings  No associated symptoms of psychosis, hallucinations and that she's not having racing thoughts.  No suicidal or homicidal thoughts. She feels comfortable with the medication and will follow Dr. Rose Fillers she is seen for a couple of years.  Past Medical History  Diagnosis Date  . Heart murmur   . Bipolar 1 disorder        . Anxiety     does not take daily unless needed  . Arthritis     bilateral hands  . History of blood transfusion   . Pain in gums 01/16/2013  . Basal cell carcinoma   . History of periodontal disease   . 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 Dementia Mother 43  . Alcohol abuse Father   . Anxiety disorder Father   . Depression Father   . Dementia Father 37  . Paranoid behavior Father   . CAD Father   . Depression Sister   . Alcohol abuse Brother   . Bipolar disorder Brother   . Drug abuse Brother   . Sexual abuse Paternal Grandfather   . OCD Neg Hx   . Schizophrenia Neg Hx   .  Thyroid disease Neg Hx   . Depression Brother   . Depression Sister   . Rheum arthritis Mother     Outpatient Encounter Prescriptions as of 10/22/2013  Medication Sig  . Acetylcarnitine HCl (ACETYL L-CARNITINE PO) Take 1 tablet by mouth daily.  . ARIPiprazole (ABILIFY) 20 MG tablet Take 1 tablet (20 mg total) by mouth at bedtime.  Marland Kitchen aspirin 81 MG tablet Take 81 mg by mouth daily.  . B Complex-Folic Acid (E-703 BALANCED TR PO) Take 1 tablet by mouth daily.  . busPIRone (BUSPAR) 10 MG tablet Take 0.5 tablets (5 mg total) by mouth 3 (three) times daily.  . Calcium Carbonate-Vitamin D (CALCIUM + D PO) Take 1 tablet by mouth daily.  . clonazePAM (KLONOPIN) 1 MG tablet Take 1 tablet (1 mg total) by mouth as directed. 1 mg in the afternoon, and 1/2 mg at bedtime.  . Coenzyme Q10 (COQ10 PO) Take 1 capsule by mouth daily.  Marland Kitchen conjugated estrogens (PREMARIN) vaginal cream 1 gram vaginally twice weekly  . FOLATE-B12-INTRINSIC FACTOR PO Take 1 tablet by mouth daily.  Marland Kitchen lamoTRIgine (LAMICTAL) 150 MG tablet Take 2 tablets (300 mg total) by mouth daily.  Marland Kitchen levocetirizine (XYZAL) 5 MG tablet Take 5 mg by mouth every evening.  Marland Kitchen MAGNESIUM PO Take 1 tablet by mouth  daily.  . Misc Natural Products (GLUCOSAMINE CHOND COMPLEX/MSM PO) Take 2 tablets by mouth daily.  . Multiple Vitamin (MULTIVITAMIN WITH MINERALS) TABS Take 1 tablet by mouth daily.  . Omega-3 Fatty Acids (FISH OIL PO) Take by mouth.  . Ospemifene (OSPHENA) 60 MG TABS TAKE 1 TABLET BY MOUTH EVERY DAY  . pregabalin (LYRICA) 50 MG capsule Take 100 mg by mouth 3 (three) times daily.   . Probiotic Product (PROBIOTIC DAILY PO) Take 1 tablet by mouth daily.  . ranitidine (ZANTAC) 75 MG tablet Take 75 mg by mouth as needed for heartburn.  Marland Kitchen RESVERATROL PO Take 1 tablet by mouth daily.  . simvastatin (ZOCOR) 20 MG tablet Take 20 mg by mouth every evening.  Marland Kitchen VITAMIN C, CALCIUM ASCORBATE, PO Take 1 tablet by mouth daily.  . Vitamin D, Ergocalciferol,  (DRISDOL) 50000 UNITS CAPS capsule Take 50,000 Units by mouth 2 (two) times a week.  . [DISCONTINUED] ARIPiprazole (ABILIFY) 20 MG tablet Take 1 tablet (20 mg total) by mouth at bedtime.  . [DISCONTINUED] lamoTRIgine (LAMICTAL) 150 MG tablet Take 2 tablets (300 mg total) by mouth daily.    No results found for this or any previous visit (from the past 2160 hour(s)).  LMP 10/18/2010   Review of Systems  Constitutional: Negative.   Psychiatric/Behavioral: Negative for depression, suicidal ideas, hallucinations and substance abuse. The patient is nervous/anxious. The patient does not have insomnia.     Mental Status Examination  Appearance: casual Alert: Yes Attention: fair  Cooperative: Yes Eye Contact: Fair Speech: normal tone Psychomotor Activity: Normal Memory/Concentration: adequate Oriented: person, place and time/date Mood: Euthymic Affect: Congruent Thought Processes and Associations: Coherent Fund of Knowledge: Fair Thought Content: Suicidal ideation and Homicidal ideation were denied. Insight: Fair Judgement: Fair  Diagnosis: Bipolar disorder, depressed. Stable. Anxiety disorder NOS.  Treatment Plan:   Continue current prescription I reviewed her Lipitor and Abilify. She does not have any involuntary movements noticeable. There is no rash reported. She'll follow Dr. Rose Fillers at [facility].  She has been off BuSpar and Klonopin. She has cut down the dose. She can still call for a few tablets of Klonopin if needed before her appointment at [facility].  Pertinent Labs and Relevant Prior Notes reviewed. Medication Side effects, benefits and risks reviewed/discussed with Patient. Time given for patient to respond and asks questions regarding the Diagnosis and Medications. Safety concerns and to report to ER if suicidal or call 911. Relevant Medications refilled or called in to pharmacy. Discussed weight maintenance and Sleep Hygiene. Follow up with Primary care provider in  regards to Medical conditions. Recommend compliance with medications and follow up office appointments. Discussed to avail opportunity to consider or/and continue Individual therapy with Counselor. Greater than 50% of time was spend in counseling and coordination of care with the patient.  Follow up not scheduled as she has made appointment with [facility] .   Merian Capron, MD 10/22/2013

## 2013-10-27 ENCOUNTER — Ambulatory Visit: Payer: BC Managed Care – PPO | Admitting: Obstetrics & Gynecology

## 2013-11-04 ENCOUNTER — Telehealth (HOSPITAL_COMMUNITY): Payer: Self-pay

## 2013-11-04 DIAGNOSIS — F314 Bipolar disorder, current episode depressed, severe, without psychotic features: Secondary | ICD-10-CM

## 2013-11-05 NOTE — Telephone Encounter (Signed)
klonopine 1mg  . 15 tablets called in. She will follow with [facility] for follow up .

## 2013-11-13 DIAGNOSIS — F3181 Bipolar II disorder: Secondary | ICD-10-CM | POA: Insufficient documentation

## 2013-11-17 ENCOUNTER — Telehealth: Payer: Self-pay

## 2013-11-17 NOTE — Telephone Encounter (Signed)
Patient notified of normal BMD results. Results will be scanned in epic.//kn

## 2013-12-31 ENCOUNTER — Telehealth: Payer: Self-pay | Admitting: Obstetrics & Gynecology

## 2013-12-31 NOTE — Telephone Encounter (Signed)
Needs OV.  I would really like to look again at the changes in the skin and see if I can figure out why she is having that little bit of bleeding.  This is not common.

## 2013-12-31 NOTE — Telephone Encounter (Signed)
Patient is asking to talk with Dr.Miller's nurse.(needs to give info) no further details given.

## 2013-12-31 NOTE — Telephone Encounter (Signed)
Spoke with patient. She is asking how long Dr. Sabra Heck would like her to continue on Premarin Cream. She states that the Premarin does help with her atrophic symptoms and feelings of "rawness" in skin. Patient uses premarin two times per week and states that the morning after each treatment, she notices a "very tiny drop of blood on my panty liner." Patient reports that this occurs with each application of Premarin. When asking patient if she wants to continue with using premarin patient patient states she does not know. Patient post menopausal, reports not having cycle in more than 6 years. Patient is currently seeing [name], for PT and is using vaginal dilators with lubricants and she is doing well in that respect.  Advised patient will discuss with Dr. Sabra Heck as it appears that Dr. Sabra Heck wanted patient to follow up after starting treatment but patient did not feel she could do that at the time.  Patient hesitant to schedule office visit. I advised I would discuss with Dr. Sabra Heck and call back with instructions from Dr. Sabra Heck directly.  She is agreeable to this.

## 2014-01-01 NOTE — Telephone Encounter (Signed)
Spoke with patient. She is scheduled for office visit with Dr. Sabra Heck patient request for date 01/08/14 at 1500.  She will call back with any increase in symptoms prior to appointment.  Routing to provider for final review. Patient agreeable to disposition. Will close encounter

## 2014-01-08 ENCOUNTER — Encounter: Payer: Self-pay | Admitting: Obstetrics & Gynecology

## 2014-01-08 ENCOUNTER — Ambulatory Visit (INDEPENDENT_AMBULATORY_CARE_PROVIDER_SITE_OTHER): Payer: BC Managed Care – PPO | Admitting: Obstetrics & Gynecology

## 2014-01-08 VITALS — BP 110/66 | HR 72 | Resp 18 | Ht 70.0 in | Wt 162.0 lb

## 2014-01-08 DIAGNOSIS — IMO0002 Reserved for concepts with insufficient information to code with codable children: Secondary | ICD-10-CM

## 2014-01-08 DIAGNOSIS — N941 Dyspareunia: Secondary | ICD-10-CM

## 2014-01-08 DIAGNOSIS — N952 Postmenopausal atrophic vaginitis: Secondary | ICD-10-CM

## 2014-01-08 NOTE — Progress Notes (Signed)
Subjective:     Patient ID: Andrea Paul, female   DOB: 1955/10/19, 58 y.o.   MRN: [id number]  HPI 58 yo G0 MWF with dyspareunia here for follow up.  She's seen [name] for several visits.  Pt has been doing perineal massage and stretching.  She is able to use a dilator with success.  Pt doesn't "like" using the Premarin vaginal cream.  Is seeing a therapist as well.  Husband of 35+ years will not go to therapy with pt.  He is not interested in her or supportive of a sex therapist as well.  Pt reports she has learned that as he is her only life-time partner, their relationship is not always "healthy" or what others would consider "normal".  There is not much physical intimacy.  They do not touch in bed at night and there is no "cuddling".   Therapist has her approaching this with many "baby steps".  Pt does really have the goal of being sexually active again with her husband.  She does have some fear of pain.  She also reports she feels he is having some erectile dysfunction due to diabetes and hypertension and the medications he is taking.  He will not discuss with his internist.    Pt is really grateful for the advice and help I've given and she feels at this point, there really isn't much else I can do.  The rest is what she and her husband will need to do.  She does want to stop the Premarin cream as she does have some personal issues with a vaginal application medication although she has been able to get over this to some degree.  She does feel the Gilberto Better is helping and would like to continue it.  Rx has already been done for a year.  Review of Systems  All other systems reviewed and are negative.      Objective:   Physical Exam  Constitutional: She is oriented to person, place, and time. She appears well-developed and well-nourished.  Genitourinary: Vagina normal. There is no rash, tenderness or injury on the right labia. There is no rash, tenderness, lesion or injury on the left labia.   Lymphadenopathy:       Right: No inguinal adenopathy present.       Left: No inguinal adenopathy present.  Neurological: She is alert and oriented to person, place, and time.  Skin: Skin is warm and dry.  Psychiatric: She has a normal mood and affect.       Assessment:     Dyspareunia  Vaginal atrohpic changes that are much improved with Premarin cream/Osphena Personal/spousal issues with intimacy    Plan:     Stop premarin vaginal cream Continue osphena.  Pt already has rx for the year. Pt will continue with therapist AEX scheduled for late spring 2016  ~30 minutes spent with patient >50% of time was in face to face discussion of above.

## 2014-04-15 ENCOUNTER — Telehealth: Payer: Self-pay | Admitting: Obstetrics & Gynecology

## 2014-04-15 NOTE — Telephone Encounter (Signed)
Spoke with patient. Advised patient PA has been sent off to insurance company. Will notify patient as soon as we have heard back in regards to PA. Patient it agreeable.

## 2014-04-15 NOTE — Telephone Encounter (Signed)
Can close encounter or leave open if you want to wait for response from insurance company.

## 2014-04-15 NOTE — Telephone Encounter (Signed)
Spoke with [employer] to initiate PA for Osphena. Awaiting faxed form to fill out for MD review before faxing back.

## 2014-04-15 NOTE — Telephone Encounter (Signed)
Prior authorization form to Dr.Miller's desk for review and signature before faxing.

## 2014-04-15 NOTE — Telephone Encounter (Signed)
Left message to call [place] at [phone].  Need to advise patient prior authorization sent today to [facility]. Sent with cover sheet and confirmation to [phone]. Will take from 48-72 hours to hear back from insurance company with response.

## 2014-04-15 NOTE — Telephone Encounter (Signed)
Pt calling to check on the status of a medication authorization.

## 2014-04-19 NOTE — Telephone Encounter (Signed)
Pt called regarding prior authorization. Pt is very anxious to know the status of this. She states she only has one pill left.   Pt requests a call back with status.

## 2014-04-19 NOTE — Telephone Encounter (Signed)
Outgoing call to [facility] provider services, due to high call volume, cannot take call at this time. Call to prior review department, busy signal.

## 2014-04-19 NOTE — Telephone Encounter (Signed)
Call to [facility]. States they have not received request. Spoke with [place].  Faxed again to [facility] with fax confirmation received again.   Call to patient to update. Advised [employer] states they do not have form at this time, despite fax confirmation received. Faxed again. Patient advised can pay cash for pills while waiting for prior authorization. Patient agreeable. Requests update tomorrow.

## 2014-04-20 NOTE — Telephone Encounter (Signed)
Spoke with patient regarding insurance denial of coverage for Osphena.  Explained to patient that her insurance requires her to use and not improve with the alternatives prior to covering this medication. Patient expresses that she doesn't understand why insurance won't cover despite Dr. Sabra Heck ordering medication. Advised patient that even if the provider orders medication, the insurance company may not cover and that if she has questions regarding her insurance coverage, she should direct those to [employer].  Advised that I have sent a message just a few minutes ago to Dr. Sabra Heck with this information. Advised that next choices are to cash pay for medication (patient states she will not do that because rx is very costly), make appeal to insurance company or try alternatives. Patient states she cannot use any medication that she must place vaginally on herself.  Advised patient would return call with advice and plan from Dr. Sabra Heck. Patient used last osphena today.

## 2014-04-20 NOTE — Telephone Encounter (Signed)
Patient would like to talk to Dr.Miller's nurse regarding her prior authorization. Patient in unclear of the results of the prior authorization.

## 2014-04-20 NOTE — Telephone Encounter (Signed)
Outgoing call to [employer]. Busy Signal.

## 2014-04-20 NOTE — Telephone Encounter (Signed)
Outgoing call to [employer].   Prior authorization for Andrea Paul has been Denied.  Patient has not tried and failed at least 2 formulary alternatives. Formulary alternatives are Premarin Cream and Vagifem.   Patient currently using both Premarin Cream and Osphena.   Dr. Sabra Heck,   Next step is appeal. How to proceed or advise patient will need to cash pay for [name]?

## 2014-04-21 ENCOUNTER — Encounter: Payer: Self-pay | Admitting: Obstetrics & Gynecology

## 2014-04-21 NOTE — Telephone Encounter (Signed)
Letter and appeal form to Dr. Ammie Ferrier workstation for signature.

## 2014-04-21 NOTE — Telephone Encounter (Signed)
Spoke with Orthoptist for [employer]. Information obtained regarding mail order program for Osphena when not covered by insurance. 90 day supply of Osphena will be $80.00.  Patient will wait for response from Dr. Sabra Heck prior to making any decisions.

## 2014-04-21 NOTE — Telephone Encounter (Signed)
Letter written for appeal.  Please print and I will sign so can be faxed.  Please let pt know we are going to try and get this covered for her.

## 2014-04-26 NOTE — Telephone Encounter (Signed)
Notified patient that appeal was sent to [facility] on 04/23/14. Advised would return her call with response.

## 2014-05-17 ENCOUNTER — Other Ambulatory Visit (HOSPITAL_COMMUNITY): Payer: Self-pay

## 2014-05-19 ENCOUNTER — Ambulatory Visit (HOSPITAL_COMMUNITY): Payer: BLUE CROSS/BLUE SHIELD | Attending: Family Medicine | Admitting: Radiology

## 2014-05-19 ENCOUNTER — Other Ambulatory Visit (HOSPITAL_COMMUNITY): Payer: Self-pay | Admitting: Lactation Services

## 2014-05-19 DIAGNOSIS — I340 Nonrheumatic mitral (valve) insufficiency: Secondary | ICD-10-CM | POA: Insufficient documentation

## 2014-05-19 NOTE — Progress Notes (Signed)
Echocardiogram performed.  

## 2014-06-14 NOTE — Telephone Encounter (Signed)
Spoke with Ramond Dial at [employer] [place]. Advised that appeal was approved 05/26/13 and that she has personally notified both patient and her husband on that date.     Routing to provider for final review. Patient agreeable to disposition. Will close encounter

## 2014-07-19 ENCOUNTER — Encounter: Payer: Self-pay | Admitting: Obstetrics & Gynecology

## 2014-07-19 ENCOUNTER — Ambulatory Visit (INDEPENDENT_AMBULATORY_CARE_PROVIDER_SITE_OTHER): Payer: BLUE CROSS/BLUE SHIELD | Admitting: Obstetrics & Gynecology

## 2014-07-19 VITALS — BP 102/70 | HR 88 | Resp 16 | Ht 70.5 in | Wt 157.0 lb

## 2014-07-19 DIAGNOSIS — Z0000 Encounter for general adult medical examination without abnormal findings: Secondary | ICD-10-CM

## 2014-07-19 DIAGNOSIS — Z01419 Encounter for gynecological examination (general) (routine) without abnormal findings: Secondary | ICD-10-CM | POA: Diagnosis not present

## 2014-07-19 LAB — POCT URINALYSIS DIPSTICK
BILIRUBIN UA: NEGATIVE
Blood, UA: NEGATIVE
Glucose, UA: NEGATIVE
Ketones, UA: NEGATIVE
Leukocytes, UA: NEGATIVE
Nitrite, UA: NEGATIVE
Protein, UA: NEGATIVE
Urobilinogen, UA: NEGATIVE
pH, UA: 6

## 2014-07-19 MED ORDER — OSPEMIFENE 60 MG PO TABS: ORAL_TABLET | ORAL | Status: DC

## 2014-07-19 NOTE — Progress Notes (Signed)
59 y.o. G0P0000 MarriedCaucasianF here for annual exam.  Reports she is doing much better with vaginal dryness with the Osphena.  She and her spouse are being sexually active.  No vaginal bleeding.  Working on weight loss.  Has lost 12 pounds.  PCP:  Carlos Levering.  [facility] [place].  Last physical exam 9/15.  Had lab work at last visit.   Patient's last menstrual period was 10/18/2010.          Sexually active: Yes.    The current method of family planning is post menopausal status.    Exercising: Yes.    Cardio 3 x weekly, weight training 2 x weekly Smoker:  no  Health Maintenance: Pap: 07/15/13 Neg, neg HR HPV 2/13 History of abnormal Pap:  no MMG:  11/06/13 BIRADS1:neg Colonoscopy:  12/2012 Dr. Collene Mares F/U 10 years  BMD:  11/06/13, -0.6 TDaP:  2013 Screening Labs: PCP, Hb today: PCP, Urine today: Clear    reports that she has never smoked. She has never used smokeless tobacco. She reports that she does not drink alcohol or use illicit drugs.  Past Medical History  Diagnosis Date  . Heart murmur   . Bipolar 1 disorder        . Anxiety     does not take daily unless needed  . Arthritis     bilateral hands  . History of blood transfusion   . Pain in gums 01/16/2013  . Basal cell carcinoma   . History of periodontal disease   . 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 Tonsillectomy    . Ganglion cyst excision      right wrist  . Anterior cervical decomp/discectomy fusion  09/17/2011    Procedure: ANTERIOR CERVICAL DECOMPRESSION/DISCECTOMY FUSION 2 LEVELS;  Surgeon: Hosie Spangle, MD;  Location: [place] [facility];  Service: Neurosurgery;  Laterality: Bilateral;  Cervical five-six, six-seven anterior cervical decompression with fusion plating and bonegraft  . Basal cell carcinoma excision  2010    Current Outpatient Prescriptions  Medication Sig Dispense Refill  . Acetylcarnitine HCl (ACETYL L-CARNITINE) 500 MG CAPS Take  1 capsule by mouth daily.    . ARIPiprazole (ABILIFY) 10 MG tablet Take 2.5 tablets by mouth daily.  1  . aspirin 81 MG tablet Take 81 mg by mouth daily.    . B Complex-Folic Acid (N-462 BALANCED TR PO) Take 1 tablet by mouth daily.    . busPIRone (BUSPAR) 30 MG tablet Take 30 mg by mouth 2 (two) times daily.  2  . calcium-vitamin D (OSCAL) 250-125 MG-UNIT per tablet Take 1 tablet by mouth daily.    . clonazePAM (KLONOPIN) 0.5 MG tablet Take 1 tablet by mouth as needed.  1  . Coenzyme Q10 (COQ10 PO) Take 1 capsule by mouth daily.    Marland Kitchen FOLATE-B12-INTRINSIC FACTOR PO Take 1 tablet by mouth daily.    . hydrOXYzine (VISTARIL) 25 MG capsule Take 1 capsule by mouth as needed.  2  . ibuprofen (ADVIL,MOTRIN) 600 MG tablet Take 1 tablet by mouth 4 (four) times daily as needed.  0  . lamoTRIgine (LAMICTAL) 150 MG tablet Take 2 tablets (300 mg total) by mouth daily. 60 tablet 0  . levocetirizine (XYZAL) 5 MG tablet Take 5 mg by mouth every evening.    . Magnesium 100 MG CAPS Take by mouth as needed.    . Multiple Vitamin (MULTIVITAMIN WITH MINERALS) TABS  Take 1 tablet by mouth daily.    . Omega-3 Fatty Acids (FISH OIL PO) Take by mouth.    . Ospemifene (OSPHENA) 60 MG TABS TAKE 1 TABLET BY MOUTH EVERY DAY 30 tablet 12  . pregabalin (LYRICA) 50 MG capsule Take 100 mg by mouth 2 (two) times daily.     . Probiotic Product (PROBIOTIC DAILY PO) Take 1 tablet by mouth daily.    . simvastatin (ZOCOR) 20 MG tablet Take 20 mg by mouth every evening.    . traZODone (DESYREL) 100 MG tablet Take 2 tablets by mouth at bedtime.  2  . VITAMIN C, CALCIUM ASCORBATE, PO Take 1 tablet by mouth daily.    Marland Kitchen VITAMIN D, CHOLECALCIFEROL, PO Take by mouth daily.     No current facility-administered medications for this visit.    Family History  Problem Relation Age of Onset  . Dementia Mother 106  . Alcohol abuse Father   . Anxiety disorder Father   . Depression Father   . Dementia Father 2  . Paranoid behavior Father    . CAD Father   . Depression Sister   . Alcohol abuse Brother   . Bipolar disorder Brother   . Drug abuse Brother   . Sexual abuse Paternal Grandfather   . OCD Neg Hx   . Schizophrenia Neg Hx   . Thyroid disease Neg Hx   . Depression Brother   . Depression Sister   . Rheum arthritis Mother     ROS:  Pertinent items are noted in HPI.  Otherwise, a comprehensive ROS was negative.  Exam:   BP 102/70 mmHg  Pulse 88  Resp 16  Ht 5' 10.5" (1.791 m)  Wt 157 lb (71.215 kg)  BMI 22.20 kg/m2  LMP 10/18/2010  Weight change: -12#   Height: 5' 10.5" (179.1 cm)  Ht Readings from Last 3 Encounters:  07/19/14 5' 10.5" (1.791 m)  01/08/14 5\' 10"  (1.778 m)  08/18/13 5\' 10"  (1.778 m)    General appearance: alert, cooperative and appears stated age Head: Normocephalic, without obvious abnormality, atraumatic Neck: no adenopathy, supple, symmetrical, trachea midline and thyroid normal to inspection and palpation Lungs: clear to auscultation bilaterally Breasts: normal appearance, no masses or tenderness Heart: regular rate and rhythm Abdomen: soft, non-tender; bowel sounds normal; no masses,  no organomegaly Extremities: extremities normal, atraumatic, no cyanosis or edema Skin: Skin color, texture, turgor normal. No rashes or lesions Lymph nodes: Cervical, supraclavicular, and axillary nodes normal. No abnormal inguinal nodes palpated Neurologic: Grossly normal   Pelvic: External genitalia:  no lesions              Urethra:  normal appearing urethra with no masses, tenderness or lesions              Bartholins and Skenes: normal                 Vagina: normal appearing vagina with normal color and discharge, no lesions              Cervix: no lesions              Pap taken: No. Bimanual Exam:  Uterus:  normal size, contour, position, consistency, mobility, non-tender              Adnexa: normal adnexa and no mass, fullness, tenderness               Rectovaginal: Confirms  Anus:  normal sphincter tone, no lesions  Chaperone was present for exam.  A:  Well Woman with normal exam Vaginal atrophy improved with Osphena H/O Bipolar 1 D/O Burning mouth syndrome  P: Mammogram yearly. D/W pt 3D yearly due to dense breast. Osphena 60mg  qd.  #30/13RF to pharmacy No pap today.  Neg 2015.  Neg HR HPV 2013.  Labs yearly with Carlos Levering return annually or prn

## 2014-07-24 ENCOUNTER — Other Ambulatory Visit: Payer: Self-pay | Admitting: Obstetrics & Gynecology

## 2014-07-26 NOTE — Telephone Encounter (Signed)
Medication refill request: Osphena  Last AEX:  07/19/14 SM Next AEX: 09/30/15 SM Last MMG (if hormonal medication request): 11/06/13 BIRADS1:Neg Refill authorized: 07/19/14 #30tabs w/ 13 Refills to [facility]

## 2015-01-13 NOTE — Telephone Encounter (Signed)
Error

## 2015-04-15 ENCOUNTER — Telehealth: Payer: Self-pay | Admitting: Obstetrics & Gynecology

## 2015-04-15 NOTE — Telephone Encounter (Signed)
Spoke with patient. Patient states that she spoke with her insurance company regarding her rx for Osphena and was advised she will need another appeal letter for coverage. Appeal letter was previously written in Feb 2016. Patient has a new Buyer, retail. Advised I will speak with her insurance plan and review what needs to be performed. Once I have spoken with her insurance I will send in all necessary forms. I will update the patient once I have completed the forms that are needed. Call to [facility] spoke with Janna B. Per [place] patient first needs a PA form filled out for her Osphena. Letitia Libra will fax the form to the office at this time.

## 2015-04-15 NOTE — Telephone Encounter (Signed)
Patient has questions about her medication. Ok to speak with spouse Clair Gulling (dpr on file) if she is not available.

## 2015-04-20 NOTE — Telephone Encounter (Signed)
Left message to call Adabella Stanis at [phone]. 

## 2015-04-20 NOTE — Telephone Encounter (Signed)
Sharee Pimple from [employer] called to say that patient's Prior Auth for Gilberto Better has been denied. She will be sending a fax in more detail about this to our office and more info on the appealing process. [place] Phone Number: [phone] ext [phone] Case # [id number]

## 2015-04-20 NOTE — Telephone Encounter (Signed)
Patient wants to speak with Kaitlyn. No information given. °

## 2015-04-21 NOTE — Telephone Encounter (Signed)
Spoke with patient. Advised we received a call from [facility] and PA was denied for her [place]. Advised we will need to perform another appeal. Advised [employer] is going to send over detailed information regarding the appeal process and where the appeal needs to be sent. Advised once I have received this information I will send the appeal to [facility]. Advised I will notify her once this has been performed. She is agreeable.

## 2015-04-26 NOTE — Telephone Encounter (Signed)
Patient called requesting to speak with Verline Lema about the appeal.

## 2015-04-26 NOTE — Telephone Encounter (Signed)
Documents received for further action after PA denial. Per review of forms appeal is permitted for possible coverage of the patient's medication. Appeals letter written and to [place] for review and signature to be sent to [facility].

## 2015-04-26 NOTE — Telephone Encounter (Signed)
Returned to your desk.  Thank you for doing this.

## 2015-04-26 NOTE — Telephone Encounter (Signed)
Spoke with Lavern in Psychologist, sport and exercise with [employer] as we never received faxed information regarding PA denial. Elnoria Howard states that PA has been denied because the patient needs to try and fail Vivelle patch, Climara patch, or Estradiol tablets for authorization approval of Osphena. Lavern reports appeal is not available for this case, but a provider curtesy review is available. Lavern states she will fax over the information to our office at this time. Form will need to be completed with supporting information faxed to the number on the form [phone].

## 2015-04-27 NOTE — Telephone Encounter (Signed)
Spoke with patient. Advised the appeal has been sent to [facility] with coversheet and confirmation to [phone]. Advised we are now waiting to hear from the insurance company regarding approval or denial of the appeal. I have requested this be expedited. The patient is agreeable and verbalizes understanding. She is aware that I will contact her as soon as we hear from the insurance company.

## 2015-04-27 NOTE — Telephone Encounter (Signed)
Patient is asking to talk with [facility] regarding ongoing conversation.

## 2015-05-06 NOTE — Telephone Encounter (Signed)
Attempted to reach patient at number provided [phone]. Phone rang and rang with no answer and no voicemail box is available to leave a message.   Need to advise patient appeal for her Gilberto Better rx has been approved from 04/18/2015-03/18/2038.

## 2015-05-10 NOTE — Telephone Encounter (Signed)
Patient notified of Patrica Duel appeal approval. Reports she received notification from her insurance company as well. Appeal sent for scan into [employer].  Routing to provider for final review. Patient agreeable to disposition. Will close encounter.

## 2015-06-06 ENCOUNTER — Telehealth: Payer: Self-pay | Admitting: Obstetrics & Gynecology

## 2015-06-06 NOTE — Telephone Encounter (Signed)
Spoke with patient. Patient states that she has been experiencing left breast pain that radiates to her left axilla since Friday 06/03/2015. Pain is intermittent, but is starting to come more frequently. Denies any lump, swelling, redness, warmth, or nipple discharge in the left breast. Has not switched bra types recently. Advised she will need to be seen in the office for further evaluation of left breast pain. She is agreeable. Appointment scheduled for 06/07/2015 at 3:30 pm with Dr.Miller. She is agreeable to date and time.  Routing to provider for final review. Patient agreeable to disposition. Will close encounter.

## 2015-06-06 NOTE — Telephone Encounter (Signed)
Patient having pain in left breast and under her arm. Chart to triage.

## 2015-06-07 ENCOUNTER — Encounter: Payer: Self-pay | Admitting: Obstetrics & Gynecology

## 2015-06-07 ENCOUNTER — Ambulatory Visit (INDEPENDENT_AMBULATORY_CARE_PROVIDER_SITE_OTHER): Payer: BLUE CROSS/BLUE SHIELD | Admitting: Obstetrics & Gynecology

## 2015-06-07 VITALS — BP 98/86 | HR 74 | Resp 16 | Ht 70.5 in | Wt 160.0 lb

## 2015-06-07 DIAGNOSIS — N644 Mastodynia: Secondary | ICD-10-CM

## 2015-06-07 NOTE — Progress Notes (Signed)
Subjective:     Patient ID: Andrea Paul, female   DOB: 08-09-1955, 60 y.o.   MRN: [id number]  HPI 60 yo G0 MWF here for complaint of left breast pain that started on Friday.  Reports the pain is an ache at times and then has an intermittent shooting component.  This pain does extend from the breast but up into axilla and around her breast.  Pain is all on the left but there is no focal area.  Denies trauma.  Pt has anxiety about this being something significant and is very nervous when I walk into the room.  Denies trauma.  Denies bruising or skin changes.  Denies nipple discharge.  Denies new fatigue or weight loss.  She does not drink much caffeine and she does not have new bras.    Last MMG was 8/16 and was negative.  Review of Systems  All other systems reviewed and are negative.      Objective:   Physical Exam  Constitutional: She appears well-developed and well-nourished.  Neck: Normal range of motion. Neck supple. No tracheal deviation present. No thyromegaly present.  Cardiovascular: Normal rate and regular rhythm.   Pulmonary/Chest: Effort normal and breath sounds normal. Right breast exhibits no inverted nipple, no mass, no nipple discharge, no skin change and no tenderness. Left breast exhibits tenderness (diffuse throughout entire breast). Left breast exhibits no inverted nipple, no mass, no nipple discharge and no skin change. Breasts are symmetrical.  Lymphadenopathy:    She has no cervical adenopathy.       Assessment:     Left breast pain, diffuse Anxiety    Plan:     Plan diagnostic left breast imaging at [facility].  Results will be communicated to the pt.

## 2015-06-08 NOTE — Progress Notes (Signed)
Patient is scheduled for L Breast Diagnostic Mammogram and L Breast Ultrasound at [facility] for 06/09/15. Patient agreeable to time/date/location.

## 2015-06-21 DIAGNOSIS — N644 Mastodynia: Secondary | ICD-10-CM | POA: Diagnosis not present

## 2015-06-23 ENCOUNTER — Telehealth: Payer: Self-pay | Admitting: Obstetrics & Gynecology

## 2015-06-23 NOTE — Telephone Encounter (Signed)
Call to patient. Diagnostic Mammogram of L Breast and Ultrasound completed at [facility] 06/21/2015. Patient states that pain continues, not improved since last visit with Dr. Sabra Heck, "maybe just a smidgen better." She is using Motrin 600 mg for relief of pain and it does not help. She has started Vitamin E after advised by Dr. Sabra Heck to do so. She does drink two cups of coffee in the mornings.   Redness to area is improved, patient thinks it may have been a bug bite.   She is requesting Dr. Sabra Heck prescribe an anti-inflammatory or any other advise for continued L Breast Pain.

## 2015-06-23 NOTE — Telephone Encounter (Addendum)
Patient said she is following up after having a mammogram for breast pain.

## 2015-06-24 NOTE — Telephone Encounter (Signed)
Routing to Dr.Miller for review and advise. 

## 2015-06-24 NOTE — Telephone Encounter (Signed)
Call to patient. Reviewed recommendations from Dr Sabra Heck. Instructed to give an update once off caffeine for a full week. Patient agreeable. Encounter closed.

## 2015-06-24 NOTE — Telephone Encounter (Signed)
She needs to drink decaf only for a week to see if this helps before adding any new medication.  Give update next Friday.

## 2015-06-24 NOTE — Telephone Encounter (Signed)
Patient calling checking to see the status of the request below. [phone]

## 2015-06-28 DIAGNOSIS — F3181 Bipolar II disorder: Secondary | ICD-10-CM | POA: Diagnosis not present

## 2015-07-19 DIAGNOSIS — F3181 Bipolar II disorder: Secondary | ICD-10-CM | POA: Diagnosis not present

## 2015-07-24 ENCOUNTER — Other Ambulatory Visit: Payer: Self-pay | Admitting: Obstetrics & Gynecology

## 2015-07-25 NOTE — Telephone Encounter (Signed)
Medication refill request: Osphena 60 mg  Last AEX:  08/06/2014 with SM  Next AEX:  09/30/15 with SM Last MMG (if hormonal medication request): 06/21/15 BI-RADS 0; Left Breast Ultrasound bi-rads 2: benign Refill authorized: #30/2 rfs

## 2015-08-09 DIAGNOSIS — F3181 Bipolar II disorder: Secondary | ICD-10-CM | POA: Diagnosis not present

## 2015-08-22 DIAGNOSIS — L814 Other melanin hyperpigmentation: Secondary | ICD-10-CM | POA: Diagnosis not present

## 2015-08-22 DIAGNOSIS — D235 Other benign neoplasm of skin of trunk: Secondary | ICD-10-CM | POA: Diagnosis not present

## 2015-08-22 DIAGNOSIS — L219 Seborrheic dermatitis, unspecified: Secondary | ICD-10-CM | POA: Diagnosis not present

## 2015-08-22 DIAGNOSIS — I780 Hereditary hemorrhagic telangiectasia: Secondary | ICD-10-CM | POA: Diagnosis not present

## 2015-08-30 DIAGNOSIS — F3181 Bipolar II disorder: Secondary | ICD-10-CM | POA: Diagnosis not present

## 2015-09-16 DIAGNOSIS — H1132 Conjunctival hemorrhage, left eye: Secondary | ICD-10-CM | POA: Diagnosis not present

## 2015-09-30 ENCOUNTER — Ambulatory Visit (INDEPENDENT_AMBULATORY_CARE_PROVIDER_SITE_OTHER): Payer: BLUE CROSS/BLUE SHIELD | Admitting: Obstetrics & Gynecology

## 2015-09-30 ENCOUNTER — Encounter: Payer: Self-pay | Admitting: Obstetrics & Gynecology

## 2015-09-30 VITALS — BP 108/70 | HR 58 | Resp 14 | Ht 70.5 in | Wt 162.2 lb

## 2015-09-30 DIAGNOSIS — Z124 Encounter for screening for malignant neoplasm of cervix: Secondary | ICD-10-CM

## 2015-09-30 DIAGNOSIS — Z01419 Encounter for gynecological examination (general) (routine) without abnormal findings: Secondary | ICD-10-CM | POA: Diagnosis not present

## 2015-09-30 DIAGNOSIS — B977 Papillomavirus as the cause of diseases classified elsewhere: Secondary | ICD-10-CM

## 2015-09-30 DIAGNOSIS — Z1151 Encounter for screening for human papillomavirus (HPV): Secondary | ICD-10-CM | POA: Diagnosis not present

## 2015-09-30 NOTE — Addendum Note (Signed)
Addended by: Megan Salon on: 09/30/2015 01:30 PM   Modules accepted: Miquel Dunn

## 2015-09-30 NOTE — Progress Notes (Addendum)
60 y.o. G0P0000 MarriedCaucasianF here for annual exam.  Doing well.  Does not want to have blood drawn today but will have Hep C testing done with PCP in September.    Denies vaginal bleeding.  Taking the [place].    Has been able to be more SA this past year and that has been a really good thing for her.    Patient's last menstrual period was 10/18/2010.          Sexually active: Yes.    The current method of family planning is post menopausal status.    Exercising: No.  The patient does not participate in regular exercise at present. Smoker:  no  Health Maintenance: Pap:  07/15/2013 negative  History of abnormal Pap:  no MMG:  06/21/2015 Incomplete, U/S same day BIRADS 2 benign  Colonoscopy:  10/14, Dr. Collene Mares,  repeat 10 years  BMD:   11/06/2013 normal repeat 3-5 years TDaP:  Up to date Pneumonia vaccine(s):  never Zostavax:   2016 with PCP Hep C testing: will discuss with PCP.  Pt will be seen with PCP  Screening Labs: PCP, Hb today: PCP, Urine today: PCP   reports that she has never smoked. She has never used smokeless tobacco. She reports that she does not drink alcohol or use illicit drugs.  Past Medical History  Diagnosis Date  . Heart murmur   . Bipolar 1 disorder ([place])        . Anxiety     does not take daily unless needed  . Arthritis     bilateral hands  . History of blood transfusion   . Pain in gums 01/16/2013  . Basal cell carcinoma   . History of periodontal disease   . 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 Tonsillectomy    . Ganglion cyst excision      right wrist  . Anterior cervical decomp/discectomy fusion  09/17/2011    Procedure: ANTERIOR CERVICAL DECOMPRESSION/DISCECTOMY FUSION 2 LEVELS;  Surgeon: Hosie Spangle, MD;  Location: [facility];  Service: Neurosurgery;  Laterality: Bilateral;  Cervical five-six, six-seven anterior cervical decompression with fusion plating and bonegraft  . Basal  cell carcinoma excision  2010    Current Outpatient Prescriptions  Medication Sig Dispense Refill  . Acetylcarnitine HCl (ACETYL L-CARNITINE) 500 MG CAPS Take 1 capsule by mouth daily.    . ARIPiprazole (ABILIFY) 30 MG tablet Take 1 tablet by mouth at bedtime.  1  . aspirin 81 MG tablet Take 81 mg by mouth daily.    . B Complex-Folic Acid (A999333 BALANCED TR PO) Take 1 tablet by mouth daily.    . busPIRone (BUSPAR) 30 MG tablet Take 30 mg by mouth 2 (two) times daily.  2  . calcium-vitamin D (OSCAL) 250-125 MG-UNIT per tablet Take 1 tablet by mouth daily.    . clonazePAM (KLONOPIN) 0.5 MG tablet Take 1 tablet by mouth as needed.  1  . Coenzyme Q10 (COQ10 PO) Take 1 capsule by mouth daily.    Marland Kitchen FOLATE-B12-INTRINSIC FACTOR PO Take 1 tablet by mouth daily.    Marland Kitchen lamoTRIgine (LAMICTAL) 150 MG tablet Take 2 tablets (300 mg total) by mouth daily. 60 tablet 0  . levocetirizine (XYZAL) 5 MG tablet Take 5 mg by mouth every evening.    . Magnesium 100 MG CAPS Take by mouth as needed.    . Multiple Vitamin (MULTIVITAMIN WITH MINERALS)  TABS Take 1 tablet by mouth daily.    . Omega-3 Fatty Acids (FISH OIL PO) Take by mouth.    . OSPHENA 60 MG TABS TAKE 1 TABLET BY MOUTH EVERY DAY 30 tablet 13  . Probiotic Product (PROBIOTIC DAILY PO) Take 1 tablet by mouth daily.    . simvastatin (ZOCOR) 20 MG tablet Take 20 mg by mouth every evening.    . traZODone (DESYREL) 100 MG tablet Take 2 tablets by mouth at bedtime.  2  . VITAMIN C, CALCIUM ASCORBATE, PO Take 1 tablet by mouth daily.    Marland Kitchen VITAMIN D, CHOLECALCIFEROL, PO Take by mouth daily.    . fluticasone (CUTIVATE) 0.05 % cream APPLY TO AFFECTED AREA TWICE A DAY AS NEEDED  2  . ibuprofen (ADVIL,MOTRIN) 600 MG tablet Take 1 tablet by mouth 4 (four) times daily as needed. Reported on 09/30/2015  0   No current facility-administered medications for this visit.    Family History  Problem Relation Age of Onset  . Dementia Mother 53  . Alcohol abuse Father    . Anxiety disorder Father   . Depression Father   . Dementia Father 3  . Paranoid behavior Father   . CAD Father   . Depression Sister   . Alcohol abuse Brother   . Bipolar disorder Brother   . Drug abuse Brother   . Sexual abuse Paternal Grandfather   . OCD Neg Hx   . Schizophrenia Neg Hx   . Thyroid disease Neg Hx   . Depression Brother   . Depression Sister   . Rheum arthritis Mother     ROS:  Pertinent items are noted in HPI.  Otherwise, a comprehensive ROS was negative.  Exam:   BP 108/70 mmHg  Pulse 58  Resp 14  Ht 5' 10.5" (1.791 m)  Wt 162 lb 3.2 oz (73.573 kg)  BMI 22.94 kg/m2  LMP 10/18/2010  Weight change: @WEIGHTCHANGE @ Height:   Height: 5' 10.5" (179.1 cm)  Ht Readings from Last 3 Encounters:  09/30/15 5' 10.5" (1.791 m)  06/07/15 5' 10.5" (1.791 m)  07/19/14 5' 10.5" (1.791 m)    General appearance: alert, cooperative and appears stated age Head: Normocephalic, without obvious abnormality, atraumatic Neck: no adenopathy, supple, symmetrical, trachea midline and thyroid normal to inspection and palpation Lungs: clear to auscultation bilaterally Breasts: normal appearance, no masses or tenderness Heart: regular rate and rhythm Abdomen: soft, non-tender; bowel sounds normal; no masses,  no organomegaly Extremities: extremities normal, atraumatic, no cyanosis or edema Skin: Skin color, texture, turgor normal. No rashes or lesions Lymph nodes: Cervical, supraclavicular, and axillary nodes normal. No abnormal inguinal nodes palpated Neurologic: Grossly normal   Pelvic: External genitalia:  no lesions              Urethra:  normal appearing urethra with no masses, tenderness or lesions              Bartholins and Skenes: normal                 Vagina: normal appearing vagina with normal color and discharge, no lesions              Cervix: no lesions              Pap taken: Yes.   Bimanual Exam:  Uterus:  normal size, contour, position, consistency,  mobility, non-tender              Adnexa: normal adnexa and no  mass, fullness, tenderness               Rectovaginal: Confirms               Anus:  normal sphincter tone, no lesions  Declines chaperone.  A:  Well Woman with normal exam Vaginal atrophy improved with Osphena H/O Bipolar 1 disorder Burning mouth syndrome  P: Mammogram yearly. D/W pt 3D yearly due to dense breast. Osphena 60mg  qd. No RF needed. Pap and HR HPV Labs yearly with Maurice Small.  Will do Hep C testing with her in the fall.  Declines having this done today. return annually or prn

## 2015-10-03 DIAGNOSIS — F3181 Bipolar II disorder: Secondary | ICD-10-CM | POA: Diagnosis not present

## 2015-10-03 DIAGNOSIS — F411 Generalized anxiety disorder: Secondary | ICD-10-CM | POA: Insufficient documentation

## 2015-10-03 DIAGNOSIS — F419 Anxiety disorder, unspecified: Secondary | ICD-10-CM | POA: Diagnosis not present

## 2015-10-05 LAB — IPS PAP TEST WITH HPV

## 2015-10-06 DIAGNOSIS — R87810 Cervical high risk human papillomavirus (HPV) DNA test positive: Secondary | ICD-10-CM | POA: Diagnosis not present

## 2015-10-09 NOTE — Addendum Note (Signed)
Addended by: Megan Salon on: 10/09/2015 09:52 PM   Modules accepted: Orders

## 2015-10-17 LAB — IPS HPV GENOTYPING 16/18

## 2015-10-31 ENCOUNTER — Telehealth: Payer: Self-pay | Admitting: *Deleted

## 2015-10-31 DIAGNOSIS — R87618 Other abnormal cytological findings on specimens from cervix uteri: Secondary | ICD-10-CM

## 2015-10-31 NOTE — Telephone Encounter (Signed)
-----   Message from Megan Salon, MD sent at 10/25/2015  6:22 PM EDT ----- Please let pt know that her pap smear was normal but the HPV testing was positive.  16/18/45 tested and was negative.  Needs repeat pap and HR HPV 1 year.  Also, on her pap were endometrial cells, so needs endometrial biopsy.  I will review pap and HPV testing with her when she comes for the biopsy.

## 2015-10-31 NOTE — Telephone Encounter (Signed)
Call to patient. Reviewed pap and HPV results with patient as well as endometrial cells noted on Pap. Advised Dr Sabra Heck recommends endometrial biopsy to evaluate.  Brief description of procedure given and instructed to take Motrin 800 mg one hour prior with food.  Discussed HPV testing and negative 16/18/45 but stressed need for repeat Pap and HPV testing at annual next year.  Advised Dr Sabra Heck will review all of this in greater detail at appointment for endometrial biopsy. Appointment scheduled for 11-03-15 at 2pm.  Patient plans to take her own Klonipin prior to procedure. Advised to come in early to sign consent.   Routing to provider for final review. Patient agreeable to disposition. Will close encounter.

## 2015-11-01 ENCOUNTER — Telehealth: Payer: Self-pay | Admitting: *Deleted

## 2015-11-01 NOTE — Telephone Encounter (Signed)
Call from patient's husband, Andrea Paul. Per DPR, may speak to husband.  Patient very anxious about phone call yesterday regarding Pap results and need for endometrial biopsy.  Reviewed Pap/ HPV results and need for repeat pap in one year at annual exam. Then explained second issue of endometrial cells noted on Pap which need additional evaluation with endometrial biopsy.  Offered earlier with Dr Sabra Heck today for procedure so she would not have to wait till Thursday. (this was offered yesterday to patient as well but declined.) Andrea Paul states they are unable to come till Thursday due to making arrangements for care of elderly parent.   Advised Dr Sabra Heck will review results and procedure in detail before proceeding. Advised she will review call today and will be aware that patient is coming early to take Klonipin due to anxiety over procedure. Andrea Paul states patient will be able to talk with Dr Sabra Heck and comprehend results even with Klonipin.  Phone call 12 minutes.  Routing to provider for final review.

## 2015-11-03 ENCOUNTER — Ambulatory Visit (INDEPENDENT_AMBULATORY_CARE_PROVIDER_SITE_OTHER): Payer: BLUE CROSS/BLUE SHIELD | Admitting: Obstetrics & Gynecology

## 2015-11-03 ENCOUNTER — Encounter: Payer: Self-pay | Admitting: Obstetrics & Gynecology

## 2015-11-03 VITALS — BP 128/80 | HR 82 | Resp 16 | Ht 70.5 in | Wt 160.0 lb

## 2015-11-03 DIAGNOSIS — N858 Other specified noninflammatory disorders of uterus: Secondary | ICD-10-CM | POA: Diagnosis not present

## 2015-11-03 DIAGNOSIS — R87618 Other abnormal cytological findings on specimens from cervix uteri: Secondary | ICD-10-CM

## 2015-11-03 DIAGNOSIS — B977 Papillomavirus as the cause of diseases classified elsewhere: Secondary | ICD-10-CM

## 2015-11-03 DIAGNOSIS — R8789 Other abnormal findings in specimens from female genital organs: Secondary | ICD-10-CM

## 2015-11-08 ENCOUNTER — Telehealth: Payer: Self-pay | Admitting: Obstetrics & Gynecology

## 2015-11-08 NOTE — Progress Notes (Signed)
GYNECOLOGY  VISIT   HPI: 60 y.o. G70P0000 Married Caucasian female with h/o endometrial cells on pap smear.  Endometrial biopsy was recommended.  She has not had vaginal bleeding since 8/12.  Husband accompanies pt.  She signed consent form and then took valium due to anxiety around procedure today.  At recent pap smear, pt was also diagnosed with HR HPV.  Pap was normal but HR HPV was positive.  Pt and her husband have not been SA for several years due to atrophic changes but with Osphena this significantly improved last year.  16/18/45 testing was done and this was negative.  Pt and spouse have many questions as he has done some Theme park manager.  All questions were addressed.  Specifically, we discussed nature of disease, presentation, other symptoms, risks of infection, relation to cancers.    Patient's last menstrual period was 10/18/2010.    Patient Active Problem List   Diagnosis Date Noted  . Bipolar 2 disorder ([place]) 11/13/2013  . Bipolar I disorder, most recent episode (or current) depressed, severe, without mention of psychotic behavior 03/25/2013  . Pain in gums 01/16/2013  . Gingival and periodontal disease 01/16/2013    Past Medical History:  Diagnosis Date  . Anxiety    does not take daily unless needed  . Arthritis    bilateral hands  . Basal cell carcinoma   . Bipolar 1 disorder ([place])       . 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 Heart murmur   . History of blood transfusion   . History of periodontal disease   . Pain in gums 01/16/2013    Past Surgical History:  Procedure Laterality Date  . ANTERIOR CERVICAL DECOMP/DISCECTOMY FUSION  09/17/2011   Procedure: ANTERIOR CERVICAL DECOMPRESSION/DISCECTOMY FUSION 2 LEVELS;  Surgeon: Hosie Spangle, MD;  Location: [facility];  Service: Neurosurgery;  Laterality: Bilateral;  Cervical five-six, six-seven anterior cervical decompression with fusion plating and bonegraft  . BASAL CELL CARCINOMA EXCISION  2010  .  GANGLION CYST EXCISION     right wrist  . TONSILLECTOMY      MEDS:  Reviewed in EPIC and UTD  ALLERGIES: Latex  Family History  Problem Relation Age of Onset  . Dementia Mother 41  . Alcohol abuse Father   . Anxiety disorder Father   . Depression Father   . Dementia Father 57  . Paranoid behavior Father   . CAD Father   . Depression Sister   . Alcohol abuse Brother   . Bipolar disorder Brother   . Drug abuse Brother   . Sexual abuse Paternal Grandfather   . OCD Neg Hx   . Schizophrenia Neg Hx   . Thyroid disease Neg Hx   . Depression Brother   . Depression Sister   . Rheum arthritis Mother    SH:  Married, non smoker  Review of Systems  All other systems reviewed and are negative.   PHYSICAL EXAMINATION:    BP 128/80 (BP Location: Right Arm, Patient Position: Sitting)   Pulse 82   Resp 16   Ht 5' 10.5" (1.791 m)   Wt 160 lb (72.6 kg)   LMP 10/18/2010   BMI 22.63 kg/m     General appearance: alert, cooperative and appears stated age Abdomen: soft, non-tender; bowel sounds normal; no masses,  no organomegaly  Pelvic: External genitalia:  no lesions              Urethra:  normal  appearing urethra with no masses, tenderness or lesions              Bartholins and Skenes: normal                 Vagina: normal appearing vagina with normal color and discharge, no lesions              Cervix: no lesions              Bimanual Exam:  Uterus:  normal size, contour, position, consistency, mobility, non-tender              Adnexa: no mass, fullness, tenderness              Endometrial biopsy recommended.  Discussed with patient.  Verbal and written consent obtained.   Procedure:  Speculum placed.  Cervix visualized and cleansed with betadine prep.  A single toothed tenaculum was applied to the anterior lip of the cervix.  Endometrial pipelle was advanced through the cervix into the endometrial cavity without difficulty.  Pipelle passed to cm.  Suction applied and pipelle  removed with good tissue sample obtained.  Tenculum removed.  No bleeding noted.  Patient tolerated procedure well.  Chaperone was present for exam.  Assessment: Endometrial cells on Pap smear H/O normal pap with +HR HPV but neg 16/18/45  Plan: Biopsy pending.  If negative, pt will need SHGM for additional evaluation.   ~15 minutes spent with patient >50% of time was in face to face discussion of HPV and questions she and spouse have in relation to HPV.

## 2015-11-08 NOTE — Telephone Encounter (Signed)
Patient is calling to schedule an ultrasound appointment. She states she spoke with Dr. Sabra Heck last week and is still waiting to get this scheduled.

## 2015-11-09 NOTE — Telephone Encounter (Signed)
Call to patient to discuss benefits and scheduling. Left voicemail to return call.

## 2015-11-10 NOTE — Telephone Encounter (Signed)
Spoke with patient regarding benefits for pelvic ultrasound with possible sonohysterogram.  Patient is scheduled 11/24/15 with Dr Sabra Heck. Patient is aware of date and time, as well as cancellation policy.  Patient requested to have a nurse call her to explained in more details, what a sonohysterogram will entail, should she need this procedure.   Routing to triage for review.

## 2015-11-11 ENCOUNTER — Other Ambulatory Visit: Payer: Self-pay

## 2015-11-11 DIAGNOSIS — R87618 Other abnormal cytological findings on specimens from cervix uteri: Secondary | ICD-10-CM

## 2015-11-11 DIAGNOSIS — F3181 Bipolar II disorder: Secondary | ICD-10-CM | POA: Diagnosis not present

## 2015-11-11 NOTE — Telephone Encounter (Signed)
Spoke with patient. All questions answered regarding PUS and possible [facility] appointment that is scheduled for 11/24/2015 at 1:30 pm. Patient verbalizes understanding and would like to proceed with appointment.  Routing to provider for final review. Patient agreeable to disposition. Will close encounter.

## 2015-11-16 DIAGNOSIS — Z1231 Encounter for screening mammogram for malignant neoplasm of breast: Secondary | ICD-10-CM | POA: Diagnosis not present

## 2015-11-24 ENCOUNTER — Ambulatory Visit: Payer: BLUE CROSS/BLUE SHIELD

## 2015-11-24 ENCOUNTER — Ambulatory Visit (INDEPENDENT_AMBULATORY_CARE_PROVIDER_SITE_OTHER): Payer: BLUE CROSS/BLUE SHIELD | Admitting: Obstetrics & Gynecology

## 2015-11-24 ENCOUNTER — Other Ambulatory Visit: Payer: Self-pay | Admitting: Obstetrics & Gynecology

## 2015-11-24 VITALS — BP 100/70 | HR 74 | Resp 14 | Ht 70.5 in | Wt 159.0 lb

## 2015-11-24 DIAGNOSIS — R938 Abnormal findings on diagnostic imaging of other specified body structures: Secondary | ICD-10-CM | POA: Diagnosis not present

## 2015-11-24 DIAGNOSIS — R87618 Other abnormal cytological findings on specimens from cervix uteri: Secondary | ICD-10-CM

## 2015-11-24 DIAGNOSIS — R9389 Abnormal findings on diagnostic imaging of other specified body structures: Secondary | ICD-10-CM

## 2015-11-24 DIAGNOSIS — D251 Intramural leiomyoma of uterus: Secondary | ICD-10-CM

## 2015-11-24 DIAGNOSIS — R8789 Other abnormal findings in specimens from female genital organs: Secondary | ICD-10-CM | POA: Diagnosis not present

## 2015-11-24 DIAGNOSIS — N9489 Other specified conditions associated with female genital organs and menstrual cycle: Secondary | ICD-10-CM | POA: Diagnosis not present

## 2015-11-24 NOTE — Progress Notes (Signed)
60 y.o.Marriedfemale here for a pelvic ultrasound with sonohystogram due to endometrial cells being noted on her pap smear.  Pt and her husband also have many questions about her recent pap smear result.  This also showed the presence of high risk HPV.  16/18/45 testing was negative.  Pt has now done some research and fully comprehends transmission via sexual activity.  She reports she's never had another sexula partner and she states her husband has never had another partner either.  So, she would like to know how she "got this".  Only explanation in this case is a false positive but the additional evaluation with 16/18/45 testing was appropriate and so is the advised follow up in one year.  Voices understanding.  Pt did have endometrial biopsy on 11/03/15 showing scant but benign endometrial cells.  She is here for additional evaluation.  Patient's last menstrual period was 10/18/2010.  Contraception:  PMP  Technique:  Both transabdominal and transvaginal ultrasound examinations of the pelvis were performed. Transabdominal technique was performed for global imaging of the pelvis including uterus, ovaries, adnexal regions, and pelvic cul-de-sac.  It was necessary to proceed with endovaginal exam following the abdominal ultrasound transabdominal exam to visualize the endometrium and adnexa.  Color and duplex Doppler ultrasound was utilized to evaluate blood flow to the ovaries.    FINDINGS: Uterus: 8.2 x 5.4 x 5.1cm with 3.6 x 2.6 x 3.3cm fibroid, 1.0 x 0.8cm fibroid, and 3.3 x 2.8cm fibroid.  All intramural. Endometrium: 9.37mm.  Cystic spaced noted. Adnexa:  Left: 3.2 x 1.4 x 0.9cm     Right:  3.0 x 1.9 x 0.9cm  Cul de sac: no free fluid  SHSG:  After obtaining appropriate verbal consent from patient, the cervix was visualized using a speculum, and prepped with betadine.  A tenaculum  was applied to the cervix.  Attempt made to path the IUI catheter.  This was unsuccessful.  Then cervical dilation  was attempted.  this was unsuccessful as well.  Due to pt discomfort, procedure was ended.  No SHGM could be performed.    Discussed with pt findings of thickened endometrium and likelihood of finding of a polyp given endometrial cells on pap smear and appearance on ultrasound today.  Recommended removal with hysteroscopy.  Procedure discussed with patient.  Recovery and pain management discussed.  Risks discussed including but not limited to bleeding, rare risk of transfusion, infection, 1% risk of uterine perforation with risks of fluid deficit causing cardiac arrythmia, cerebral swelling and/or need to stop procedure early.  Fluid emboli and rare risk of death discussed.  DVT/PE, rare risk of risk of bowel/bladder/ureteral/vascular injury.  Patient aware if pathology abnormal she may need additional treatment.  All questions answered.  Pt agrees with proceeding.  Physical Exam  Constitutional: She is oriented to person, place, and time. She appears well-developed and well-nourished.  Cardiovascular: Normal rate and regular rhythm.   Pulmonary/Chest: Effort normal and breath sounds normal.  Abdominal: Soft. Bowel sounds are normal.  Genitourinary: Rectum normal, vagina normal and uterus normal. There is no rash, tenderness, lesion or injury on the right labia. There is no rash, tenderness, lesion or injury on the left labia. Cervix exhibits no motion tenderness. Right adnexum displays no mass and no tenderness. Left adnexum displays no mass, no tenderness and no fullness.  Lymphadenopathy:       Right: No inguinal adenopathy present.       Left: No inguinal adenopathy present.  Neurological: She is alert and  oriented to person, place, and time.  Skin: Skin is warm and dry.  Psychiatric: She has a normal mood and affect.    Assessment:  Endometrial cells on pap smear  Thickened endometrium consistent with endometrial polyp Uterine fibroids H/O +HR HPV  Plan:  Hysteroscopy with polyp  resection, D&C will be planned.  Pt in agreement and ready for scheduling.  ~45 minutes spent with patient >50% of time was in face to face discussion of above.

## 2015-11-27 ENCOUNTER — Encounter: Payer: Self-pay | Admitting: Obstetrics & Gynecology

## 2015-11-27 DIAGNOSIS — D251 Intramural leiomyoma of uterus: Secondary | ICD-10-CM | POA: Insufficient documentation

## 2015-11-27 DIAGNOSIS — N9489 Other specified conditions associated with female genital organs and menstrual cycle: Secondary | ICD-10-CM | POA: Insufficient documentation

## 2015-11-27 DIAGNOSIS — R9389 Abnormal findings on diagnostic imaging of other specified body structures: Secondary | ICD-10-CM | POA: Insufficient documentation

## 2015-11-27 DIAGNOSIS — R87618 Other abnormal cytological findings on specimens from cervix uteri: Secondary | ICD-10-CM | POA: Insufficient documentation

## 2015-12-05 ENCOUNTER — Other Ambulatory Visit: Payer: Self-pay | Admitting: Obstetrics & Gynecology

## 2015-12-05 ENCOUNTER — Telehealth: Payer: Self-pay | Admitting: Obstetrics & Gynecology

## 2015-12-05 NOTE — Telephone Encounter (Signed)
Call back to patient. Confirmed surgery date of 12-19-15 at 0730 at [place] arrive at 0600 unless hospital directs differently. Surgery instruction sheet reviewed and printed copy mailed to patient, see copy scanned to chart.  Routing to provider for final review. Patient agreeable to disposition. Will close encounter.

## 2015-12-05 NOTE — Telephone Encounter (Signed)
Patient says she was supposed to be scheduled for surgery within 30 days of having ultrasound which was 9/7.

## 2015-12-05 NOTE — Telephone Encounter (Signed)
Routing to Lamont Snowball, RN for review and scheduling of hysteroscopy with polyp resection, D&C (please see OV note from 11/24/2015).

## 2015-12-05 NOTE — Telephone Encounter (Signed)
Return call to patient. Date options reviewed. Patient ready to schedule for 12-19-15. Will call her back to confirm.

## 2015-12-13 ENCOUNTER — Telehealth: Payer: Self-pay | Admitting: Obstetrics & Gynecology

## 2015-12-13 ENCOUNTER — Encounter: Payer: Self-pay | Admitting: Obstetrics & Gynecology

## 2015-12-13 DIAGNOSIS — F3181 Bipolar II disorder: Secondary | ICD-10-CM | POA: Diagnosis not present

## 2015-12-13 NOTE — Telephone Encounter (Signed)
Spoke with patient. Patient states she is having a Hysteroscopy with polyp resection and D&C on 12/19/15. Patient states she has some concerns about polyps growing back after removal. Patient would like to know benefit of hysteroscopy with polyp resection at this point vs. taking the "whole uterus". Patient states she is currently taking meds prescribed by psychiatrist and can not take hydrocodone for pain control. Patient states she would like to know what she will be given for pain control to review with psychiatrist prior to surgery . Advised will review with Dr. Sabra Heck for recommendations and return call. Patient is agreeable.     Dr. Sabra Heck, please advise?

## 2015-12-13 NOTE — Telephone Encounter (Signed)
Hysterectomy is not appropriate treatment for a polyp.  If this were a recurrent problem, then hysterectomy can become part of the conversation but not yet.  Also, it is appropriate to be 100% sure this is benign.

## 2015-12-13 NOTE — Telephone Encounter (Signed)
Patient is asking to talk with a nurse regarding her upcoming surgery.

## 2015-12-14 DIAGNOSIS — Z0000 Encounter for general adult medical examination without abnormal findings: Secondary | ICD-10-CM | POA: Diagnosis not present

## 2015-12-14 DIAGNOSIS — E785 Hyperlipidemia, unspecified: Secondary | ICD-10-CM | POA: Diagnosis not present

## 2015-12-14 DIAGNOSIS — R7301 Impaired fasting glucose: Secondary | ICD-10-CM | POA: Diagnosis not present

## 2015-12-14 DIAGNOSIS — D649 Anemia, unspecified: Secondary | ICD-10-CM | POA: Diagnosis not present

## 2015-12-15 NOTE — Patient Instructions (Addendum)
Your procedure is scheduled on:  Monday, Oct. 2, 2017  Enter through the Main Entrance of [facility] at:  6:00 AM   Pick up the phone at the desk and dial [phone].  Call this number if you have problems the morning of surgery: [phone].  Remember: Do NOT eat food or drink after:  Midnight Sunday  Take these medicines the morning of surgery with a SIP OF WATER:  Buspar,  Clonazepam if needed  Stop taking fish oil and Vit. C at this time  Do NOT wear jewelry (body piercing), metal hair clips/bobby pins, make-up, or nail polish. Do NOT wear lotions, powders, or perfumes.  You may wear deodorant. Do NOT shave for 48 hours prior to surgery. Do NOT bring valuables to the hospital. Contacts, dentures, or bridgework may not be worn into surgery.  Have a responsible adult drive you home and stay with you for 24 hours after your procedure

## 2015-12-16 ENCOUNTER — Encounter (HOSPITAL_COMMUNITY): Payer: Self-pay

## 2015-12-16 ENCOUNTER — Encounter (HOSPITAL_COMMUNITY)
Admission: RE | Admit: 2015-12-16 | Discharge: 2015-12-16 | Disposition: A | Discharge status: Home / Self Care | Payer: BLUE CROSS/BLUE SHIELD | Source: Ambulatory Visit | Attending: Obstetrics & Gynecology | Admitting: Obstetrics & Gynecology

## 2015-12-16 DIAGNOSIS — Z7982 Long term (current) use of aspirin: Secondary | ICD-10-CM | POA: Diagnosis not present

## 2015-12-16 DIAGNOSIS — N840 Polyp of corpus uteri: Secondary | ICD-10-CM | POA: Diagnosis not present

## 2015-12-16 DIAGNOSIS — N950 Postmenopausal bleeding: Secondary | ICD-10-CM | POA: Diagnosis not present

## 2015-12-16 DIAGNOSIS — R938 Abnormal findings on diagnostic imaging of other specified body structures: Secondary | ICD-10-CM | POA: Diagnosis not present

## 2015-12-16 HISTORY — DX: Sleep apnea, unspecified: G47.30

## 2015-12-16 HISTORY — DX: Prediabetes: R73.03

## 2015-12-16 HISTORY — DX: Anemia, unspecified: D64.9

## 2015-12-19 ENCOUNTER — Ambulatory Visit (HOSPITAL_COMMUNITY): Payer: BLUE CROSS/BLUE SHIELD | Admitting: Anesthesiology

## 2015-12-19 ENCOUNTER — Encounter (HOSPITAL_COMMUNITY): Payer: Self-pay | Admitting: Anesthesiology

## 2015-12-19 ENCOUNTER — Ambulatory Visit (HOSPITAL_COMMUNITY)
Admission: RE | Admit: 2015-12-19 | Discharge: 2015-12-19 | Disposition: A | Discharge status: Home / Self Care | Payer: BLUE CROSS/BLUE SHIELD | Source: Ambulatory Visit | Attending: Obstetrics & Gynecology | Admitting: Obstetrics & Gynecology

## 2015-12-19 ENCOUNTER — Encounter (HOSPITAL_COMMUNITY)
Admission: RE | Disposition: A | Discharge status: Home / Self Care | Payer: Self-pay | Source: Ambulatory Visit | Attending: Obstetrics & Gynecology

## 2015-12-19 DIAGNOSIS — N840 Polyp of corpus uteri: Secondary | ICD-10-CM | POA: Diagnosis not present

## 2015-12-19 DIAGNOSIS — R918 Other nonspecific abnormal finding of lung field: Secondary | ICD-10-CM | POA: Diagnosis not present

## 2015-12-19 DIAGNOSIS — N9489 Other specified conditions associated with female genital organs and menstrual cycle: Secondary | ICD-10-CM | POA: Diagnosis not present

## 2015-12-19 DIAGNOSIS — Z7982 Long term (current) use of aspirin: Secondary | ICD-10-CM | POA: Diagnosis not present

## 2015-12-19 DIAGNOSIS — R938 Abnormal findings on diagnostic imaging of other specified body structures: Secondary | ICD-10-CM | POA: Insufficient documentation

## 2015-12-19 DIAGNOSIS — N950 Postmenopausal bleeding: Secondary | ICD-10-CM | POA: Insufficient documentation

## 2015-12-19 DIAGNOSIS — R8789 Other abnormal findings in specimens from female genital organs: Secondary | ICD-10-CM | POA: Diagnosis not present

## 2015-12-19 HISTORY — PX: DILATATION & CURETTAGE/HYSTEROSCOPY WITH MYOSURE: SHX6511

## 2015-12-19 SURGERY — DILATATION & CURETTAGE/HYSTEROSCOPY WITH MYOSURE
Anesthesia: General | Site: Vagina

## 2015-12-19 MED ORDER — ACETAMINOPHEN 650 MG RE SUPP
650.0000 mg | RECTAL | Status: DC | PRN
  Filled 2015-12-19: qty 1

## 2015-12-19 MED ORDER — DEXAMETHASONE SODIUM PHOSPHATE 10 MG/ML IJ SOLN
INTRAMUSCULAR | Status: AC
  Filled 2015-12-19: qty 1

## 2015-12-19 MED ORDER — FENTANYL CITRATE (PF) 100 MCG/2ML IJ SOLN: 25.0000 ug | INTRAMUSCULAR | Status: DC | PRN

## 2015-12-19 MED ORDER — ONDANSETRON HCL 4 MG/2ML IJ SOLN: 4.0000 mg | Freq: Once | INTRAMUSCULAR | Status: DC | PRN

## 2015-12-19 MED ORDER — OXYCODONE HCL 5 MG PO TABS: 5.0000 mg | ORAL_TABLET | ORAL | Status: DC | PRN

## 2015-12-19 MED ORDER — LIDOCAINE HCL (CARDIAC) 20 MG/ML IV SOLN
INTRAVENOUS | Status: DC | PRN
  Administered 2015-12-19: 100 mg via INTRAVENOUS

## 2015-12-19 MED ORDER — SODIUM CHLORIDE 0.9 % IR SOLN
Status: DC | PRN
  Administered 2015-12-19: 3000 mL

## 2015-12-19 MED ORDER — ONDANSETRON HCL 4 MG/2ML IJ SOLN
INTRAMUSCULAR | Status: DC | PRN
  Administered 2015-12-19: 4 mg via INTRAVENOUS

## 2015-12-19 MED ORDER — LIDOCAINE-EPINEPHRINE 1 %-1:100000 IJ SOLN
INTRAMUSCULAR | Status: DC | PRN
  Administered 2015-12-19: 10 mL

## 2015-12-19 MED ORDER — KETOROLAC TROMETHAMINE 30 MG/ML IJ SOLN
INTRAMUSCULAR | Status: AC
  Filled 2015-12-19: qty 1

## 2015-12-19 MED ORDER — LIDOCAINE HCL (CARDIAC) 20 MG/ML IV SOLN
INTRAVENOUS | Status: AC
  Filled 2015-12-19: qty 5

## 2015-12-19 MED ORDER — PROPOFOL 10 MG/ML IV BOLUS
INTRAVENOUS | Status: AC
  Filled 2015-12-19: qty 20

## 2015-12-19 MED ORDER — ACETAMINOPHEN 325 MG PO TABS: 650.0000 mg | ORAL_TABLET | ORAL | Status: DC | PRN

## 2015-12-19 MED ORDER — FENTANYL CITRATE (PF) 100 MCG/2ML IJ SOLN
INTRAMUSCULAR | Status: AC
  Filled 2015-12-19: qty 2

## 2015-12-19 MED ORDER — LACTATED RINGERS IV SOLN
INTRAVENOUS | Status: DC
  Administered 2015-12-19 (×2): via INTRAVENOUS

## 2015-12-19 MED ORDER — LIDOCAINE-EPINEPHRINE 1 %-1:100000 IJ SOLN
INTRAMUSCULAR | Status: AC
  Filled 2015-12-19: qty 1

## 2015-12-19 MED ORDER — MIDAZOLAM HCL 2 MG/2ML IJ SOLN
INTRAMUSCULAR | Status: AC
  Filled 2015-12-19: qty 2

## 2015-12-19 MED ORDER — OXYCODONE HCL 5 MG/5ML PO SOLN: 5.0000 mg | Freq: Once | ORAL | Status: DC | PRN

## 2015-12-19 MED ORDER — HYDROMORPHONE HCL 1 MG/ML IJ SOLN: 0.2500 mg | INTRAMUSCULAR | Status: DC | PRN

## 2015-12-19 MED ORDER — SCOPOLAMINE 1 MG/3DAYS TD PT72
1.0000 | MEDICATED_PATCH | Freq: Once | TRANSDERMAL | Status: DC
  Administered 2015-12-19: 1.5 mg via TRANSDERMAL

## 2015-12-19 MED ORDER — OXYCODONE HCL 5 MG PO TABS: 5.0000 mg | ORAL_TABLET | Freq: Once | ORAL | Status: DC | PRN

## 2015-12-19 MED ORDER — SILVER NITRATE-POT NITRATE 75-25 % EX MISC
CUTANEOUS | Status: AC
  Filled 2015-12-19: qty 10

## 2015-12-19 MED ORDER — DEXAMETHASONE SODIUM PHOSPHATE 4 MG/ML IJ SOLN
INTRAMUSCULAR | Status: DC | PRN
  Administered 2015-12-19: 10 mg via INTRAVENOUS

## 2015-12-19 MED ORDER — KETOROLAC TROMETHAMINE 30 MG/ML IJ SOLN
INTRAMUSCULAR | Status: DC | PRN
  Administered 2015-12-19: 30 mg via INTRAVENOUS
  Administered 2015-12-19: 30 mg via INTRAMUSCULAR

## 2015-12-19 MED ORDER — MIDAZOLAM HCL 5 MG/5ML IJ SOLN
INTRAMUSCULAR | Status: DC | PRN
  Administered 2015-12-19: 2 mg via INTRAVENOUS

## 2015-12-19 MED ORDER — SCOPOLAMINE 1 MG/3DAYS TD PT72
MEDICATED_PATCH | TRANSDERMAL | Status: AC
  Filled 2015-12-19: qty 1

## 2015-12-19 MED ORDER — PROPOFOL 10 MG/ML IV BOLUS
INTRAVENOUS | Status: DC | PRN
  Administered 2015-12-19: 150 mg via INTRAVENOUS

## 2015-12-19 SURGICAL SUPPLY — 24 items
CANISTER SUCT 3000ML (MISCELLANEOUS) ×2 IMPLANT
CATH ROBINSON RED A/P 16FR (CATHETERS) ×1 IMPLANT
CATH SILICONE 16FRX5CC (CATHETERS) ×1 IMPLANT
CLOTH BEACON ORANGE TIMEOUT ST (SAFETY) ×2 IMPLANT
CONTAINER PREFILL 10% NBF 60ML (FORM) ×3 IMPLANT
DEVICE MYOSURE LITE (MISCELLANEOUS) ×1 IMPLANT
DEVICE MYOSURE REACH (MISCELLANEOUS) IMPLANT
DILATOR CANAL MILEX (MISCELLANEOUS) ×1 IMPLANT
ELECT REM PT RETURN 9FT ADLT (ELECTROSURGICAL)
ELECTRODE REM PT RTRN 9FT ADLT (ELECTROSURGICAL) IMPLANT
FILTER ARTHROSCOPY CONVERTOR (FILTER) ×2 IMPLANT
GLOVE BIOGEL PI IND STRL 6.5 (GLOVE) IMPLANT
GLOVE BIOGEL PI IND STRL 7.0 (GLOVE) ×2 IMPLANT
GLOVE BIOGEL PI INDICATOR 6.5 (GLOVE) ×1
GLOVE BIOGEL PI INDICATOR 7.0 (GLOVE) ×2
GLOVE ECLIPSE 6.5 STRL STRAW (GLOVE) ×1 IMPLANT
GOWN STRL REUS W/TWL LRG LVL3 (GOWN DISPOSABLE) ×4 IMPLANT
PACK VAGINAL MINOR WOMEN LF (CUSTOM PROCEDURE TRAY) ×2 IMPLANT
PAD OB MATERNITY 4.3X12.25 (PERSONAL CARE ITEMS) ×2 IMPLANT
SEAL ROD LENS SCOPE MYOSURE (ABLATOR) ×2 IMPLANT
TOWEL OR 17X24 6PK STRL BLUE (TOWEL DISPOSABLE) ×4 IMPLANT
TUBING AQUILEX INFLOW (TUBING) ×2 IMPLANT
TUBING AQUILEX OUTFLOW (TUBING) ×2 IMPLANT
WATER STERILE IRR 1000ML POUR (IV SOLUTION) ×2 IMPLANT

## 2015-12-19 NOTE — Discharge Instructions (Addendum)
DISCHARGE INSTRUCTIONS: D&C / D&E The following instructions have been prepared to help you care for yourself upon your return home.   Personal hygiene:  Use sanitary pads for vaginal drainage, not tampons.  Shower the day after your procedure.  NO tub baths, pools or Jacuzzis for 2-3 weeks.  Wipe front to back after using the bathroom.  Activity and limitations:  Do NOT drive or operate any equipment for 24 hours. The effects of anesthesia are still present and drowsiness may result.  Do NOT rest in bed all day.  Walking is encouraged.  Walk up and down stairs slowly.  You may resume your normal activity in one to two days or as indicated by your physician.  Sexual activity: NO intercourse for at least 2 weeks after the procedure, or as indicated by your physician.  Diet: Eat a light meal as desired this evening. You may resume your usual diet tomorrow.  Return to work: You may resume your work activities in one to two days or as indicated by your doctor.  What to expect after your surgery: Expect to have vaginal bleeding/discharge for 2-3 days and spotting for up to 10 days. It is not unusual to have soreness for up to 1-2 weeks. You may have a slight burning sensation when you urinate for the first day. Mild cramps may continue for a couple of days. You may have a regular period in 2-6 weeks.  Call your doctor for any of the following:  Excessive vaginal bleeding, saturating and changing one pad every hour.  Inability to urinate 6 hours after discharge from hospital.  Pain not relieved by pain medication.  Fever of 100.4 F or greater.  Unusual vaginal discharge or odor.   Call for an appointment:    Patients signature: ______________________  Nurses signature ________________________  Support person's signature_______________________   Post-surgical Instructions, Outpatient Surgery  You may expect to feel dizzy, weak, and drowsy for as long as 24 hours  after receiving the medicine that made you sleep (anesthetic). For the first 24 hours after your surgery:    Do not drive a car, ride a bicycle, participate in physical activities, or take public transportation until you are done taking narcotic pain medicines or as directed by Dr. Sabra Heck.   Do not drink alcohol or take tranquilizers.   Do not take medicine that has not been prescribed by your physicians.   Do not sign important papers or make important decisions while on narcotic pain medicines.   Have a responsible person with you.   PAIN MANAGEMENT  Motrin 800mg .  (This is the same as 4-200mg  over the counter tablets of Motrin or ibuprofen.)  You may take this every eight hours or as needed for cramping.    DO'S AND DON'T'S  Do not take a tub bath for one week.  You may shower on the first day after your surgery  Do not do any heavy lifting for one to two weeks.  This increases the chance of bleeding.  Do move around as you feel able.  Stairs are fine.  You may begin to exercise again as you feel able.  Do not lift any weights for two weeks.  Do not put anything in the vagina for two weeks--no tampons, intercourse, or douching.    REGULAR MEDIATIONS/VITAMINS:  You may restart all of your regular medications as prescribed.  You may restart all of your vitamins as you normally take them.    [place]  MEDICAL CARE IF:  You have persistent nausea and vomiting.   You have trouble eating or drinking.   You have an oral temperature above 100.5.   You have constipation that is not helped by adjusting diet or increasing fluid intake. Pain medicines are a common cause of constipation.   You have heavy vaginal bleeding

## 2015-12-19 NOTE — Transfer of Care (Signed)
Immediate Anesthesia Transfer of Care Note  Patient: Andrea Paul  Procedure(s) Performed: Procedure(s): DILATATION & CURETTAGE/HYSTEROSCOPY WITH MYOSURE (N/A)  Patient Location: PACU  Anesthesia Type:General  Level of Consciousness: awake, alert  and oriented  Airway & Oxygen Therapy: Patient Spontanous Breathing and Patient connected to nasal cannula oxygen  Post-op Assessment: Report given to RN and Post -op Vital signs reviewed and stable  Post vital signs: Reviewed and stable  Last Vitals:  Vitals:   12/19/15 0608  BP: (!) 101/43  Pulse: 63  Resp: 16  Temp: 36.6 C    Last Pain:  Vitals:   12/19/15 0608  TempSrc: Oral      Patients Stated Pain Goal: 3 ([id number])  Complications: No apparent anesthesia complications

## 2015-12-19 NOTE — Telephone Encounter (Signed)
Encounter closed

## 2015-12-19 NOTE — Anesthesia Postprocedure Evaluation (Signed)
Anesthesia Post Note  Patient: Andrea Paul  Procedure(s) Performed: Procedure(s) (LRB): DILATATION & CURETTAGE/HYSTEROSCOPY WITH MYOSURE (N/A)  Patient location during evaluation: PACU Anesthesia Type: General Level of consciousness: awake Pain management: pain level controlled Vital Signs Assessment: post-procedure vital signs reviewed and stable Respiratory status: spontaneous breathing Cardiovascular status: stable Postop Assessment: no signs of nausea or vomiting Anesthetic complications: no     Last Vitals:  Vitals:   12/19/15 0808 12/19/15 0815  BP: 116/62 (!) 102/54  Pulse: 73 65  Resp: 12 11  Temp: 36.4 C     Last Pain:  Vitals:   12/19/15 0608  TempSrc: Oral   Pain Goal: Patients Stated Pain Goal: 3 (12/19/15 [id number])               [name],[name]

## 2015-12-19 NOTE — Op Note (Signed)
12/19/2015  7:58 AM  PATIENT:  Andrea Paul  60 y.o. female  PRE-OPERATIVE DIAGNOSIS:  endometrial cells on pap, thickened endometrium consistent with endometrial polyp   POST-OPERATIVE DIAGNOSIS:  thickened endometrium consistent with endometrial polyp   PROCEDURE:  Procedure(s): DILATATION & CURETTAGE/HYSTEROSCOPY WITH MYOSURE  SURGEON:  Navraj Dreibelbis SUZANNE  ASSISTANTS: OR staff   ANESTHESIA:   general  ESTIMATED BLOOD LOSS: 10cc  BLOOD ADMINISTERED:none   FLUIDS: 1500ccLR  UOP: 60 cc drained at beginning of procedure with I&O cath  SPECIMEN:  Endometrial polyps and possible fibroids, endometrial curettings  DISPOSITION OF SPECIMEN:  PATHOLOGY  FINDINGS: at least 5 polyps noted.  One of these may be a submucosal fibroid.  Thin endometrium  DESCRIPTION OF OPERATION: Patient was taken to the operating room.  She is placed in the supine position. SCDs were on her lower extremities and functioning properly. General anesthesia with an LMA was administered without difficulty.  Legs were then placed in the [place] in the low lithotomy position. The legs were lifted to the high lithotomy position and the Betadine prep was used on the inner thighs perineum and vagina x3. Patient was draped in a normal standard fashion. An in and out catheterization with a red rubber Foley catheter was performed. Approximately 60 cc of clear urine was noted. A heavy weighted speculum was placed the vagina. The anterior lip of the cervix was grasped with single-tooth tenaculum.  A paracervical block of 1% lidocaine mixed one-to-one with epinephrine (1:100,000 units).  10 cc was used total. The cervix is dilated up to #23 [facility] dilators. The endometrial cavity sounded to 7 cm.   A 2.9 millimeter diagnostic hysteroscope was obtained. NS was used as a hysteroscopic fluid. The hysteroscope was advanced through the endocervical canal into the endometrial cavity. The tubal ostia were noted  bilaterally. there were several polyps, at least 5 and one of these may be a submucosal fibroid.  The endometrium was thin and atrophic appearing.  Using the Myosure lite, every intracavitary lesion was resected without difficulty.  Photodocumentation was made before and after the procedure.  The hysteroscope was removed. A #1 smooth curette was used to curette the cavity until rough gritty texture was noted in all quadrants.  Minimal tissue was obtained.  At this point, the procedure was ended.  The hysteroscope was removed. The fluid deficit was 200 cc. The tenaculum was removed from the anterior lip of the cervix. The speculum was returned vagina. The prep was cleansed of the patient's skin. The legs are positioned back in the supine position. Sponge, lap, needle, initially counts were correct x2. Patient was taken to recovery in stable condition.  COUNTS:  YES  PLAN OF CARE: Transfer to PACU

## 2015-12-19 NOTE — Anesthesia Procedure Notes (Signed)
Procedure Name: LMA Insertion Date/Time: 12/19/2015 11:29 AM Performed by: Riki Sheer Pre-anesthesia Checklist: Patient identified, Emergency Drugs available, Suction available, Patient being monitored and Timeout performed Patient Re-evaluated:Patient Re-evaluated prior to inductionOxygen Delivery Method: Circle system utilized Preoxygenation: Pre-oxygenation with 100% oxygen Intubation Type: IV induction Ventilation: Mask ventilation without difficulty LMA: LMA inserted LMA Size: 4.0 Number of attempts: 1 Placement Confirmation: positive ETCO2,  CO2 detector and breath sounds checked- equal and bilateral Tube secured with: Tape Dental Injury: Teeth and Oropharynx as per pre-operative assessment

## 2015-12-19 NOTE — Anesthesia Preprocedure Evaluation (Signed)
Anesthesia Evaluation  Patient identified by MRN, date of birth, ID band Patient awake    Reviewed: Allergy & Precautions, NPO status , Patient's Chart, lab work & pertinent test results  Airway Mallampati: II  TM Distance: >3 FB Neck ROM: Full    Dental  (+) Teeth Intact, Dental Advisory Given   Pulmonary    breath sounds clear to auscultation       Cardiovascular  Rhythm:Regular Rate:Normal     Neuro/Psych    GI/Hepatic   Endo/Other    Renal/GU      Musculoskeletal   Abdominal   Peds  Hematology   Anesthesia Other Findings   Reproductive/Obstetrics                            Anesthesia Physical Anesthesia Plan  ASA: II  Anesthesia Plan: General   Post-op Pain Management:    Induction: Intravenous  Airway Management Planned: LMA  Additional Equipment:   Intra-op Plan:   Post-operative Plan:   Informed Consent: I have reviewed the patients History and Physical, chart, labs and discussed the procedure including the risks, benefits and alternatives for the proposed anesthesia with the patient or authorized representative who has indicated his/her understanding and acceptance.   Dental advisory given  Plan Discussed with: CRNA and Anesthesiologist  Anesthesia Plan Comments:         Anesthesia Quick Evaluation  

## 2015-12-19 NOTE — Telephone Encounter (Signed)
Dr. Sabra Heck, ok to close encounter? Patient surgery completed 12/19/15.

## 2015-12-19 NOTE — H&P (Signed)
Andrea Paul is an 60 y.o. female MWF here for hysteroscopy with polyp resection and dilation and curettage due to with endometrial cells on pap smearand endometrial polyp noted on ultrasound.  Risks, benefits, and alternatives have been discussed.  Pt is here and ready to proceed.  Pertinent Gynecological History: Menses: post-menopausal Bleeding: post menopausal bleeding Contraception: none and PMP DES exposure: denies Blood transfusions: age 89 after tonsillectomy Sexually transmitted diseases: no past history Previous GYN Procedures: none  Last mammogram: normal Date: 8/17 Last pap: normal Date: normal with +HR HPV OB History: G0, P0   Menstrual History: Patient's last menstrual period was 10/18/2010.    Past Medical History:  Diagnosis Date  . Anemia    history of  . Anxiety    does not take daily unless needed  . Arthritis    bilateral hands  . Basal cell carcinoma   . Bipolar 1 disorder (HCC)       . 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 Heart murmur   . History of blood transfusion    age 60 years old  . History of periodontal disease   . Pain in gums 01/16/2013  . Pre-diabetes   . Sleep apnea    no CPAP prescribed, sleeps on sides    Past Surgical History:  Procedure Laterality Date  . ANTERIOR CERVICAL DECOMP/DISCECTOMY FUSION  09/17/2011   Procedure: ANTERIOR CERVICAL DECOMPRESSION/DISCECTOMY FUSION 2 LEVELS;  Surgeon: Hewitt Shortsobert W Nudelman, MD;  Location: [facility];  Service: Neurosurgery;  Laterality: Bilateral;  Cervical five-six, six-seven anterior cervical decompression with fusion plating and bonegraft  . BASAL CELL CARCINOMA EXCISION  2010  . COLONOSCOPY  2014  . ENDOMETRIAL BIOPSY    . GANGLION CYST EXCISION     right wrist  . TONSILLECTOMY      Family History  Problem Relation Age of Onset  . Dementia Mother [id number]  . Rheum arthritis Mother   . Alcohol abuse Father   . Anxiety disorder Father   . Depression Father   . Dementia Father [id number]  .  Paranoid behavior Father   . CAD Father   . Depression Sister   . Alcohol abuse Brother   . Bipolar disorder Brother   . Drug abuse Brother   . Sexual abuse Paternal Grandfather   . Depression Brother   . Depression Sister   . OCD Neg Hx   . Schizophrenia Neg Hx   . Thyroid disease Neg Hx     Social History:  reports that she has never smoked. She has never used smokeless tobacco. She reports that she does not drink alcohol or use drugs.  Allergies:  Allergies  Allergen Reactions  . Latex Itching    Prescriptions Prior to Admission  Medication Sig Dispense Refill Last Dose  . Acetylcarnitine HCl (ACETYL L-CARNITINE) 500 MG CAPS Take 1 capsule by mouth daily.   12/16/2015  . ARIPiprazole (ABILIFY) 30 MG tablet Take 1 tablet by mouth at bedtime.  1 12/18/2015 at Unknown time  . aspirin 81 MG tablet Take 81 mg by mouth daily.   12/16/2015  . B Complex-Folic Acid (B-100 BALANCED TR PO) Take 1 tablet by mouth daily.   12/16/2015  . busPIRone (BUSPAR) 30 MG tablet Take 30 mg by mouth 2 (two) times daily.  2 12/19/2015 at Unknown time  . calcium-vitamin D (OSCAL) 250-125 MG-UNIT per tablet Take 1 tablet by mouth daily.   12/16/2015  . clonazePAM (KLONOPIN) 1 MG tablet Take  0.5 tablets by mouth 2 (two) times daily as needed for anxiety.   0 12/19/2015 at Unknown time  . Coenzyme Q10 (COQ10 PO) Take 1 capsule by mouth daily.   12/16/2015  . fluticasone (CUTIVATE) 0.05 % cream APPLY TO AFFECTED AREA TWICE A DAY AS NEEDED FOR DERMATITIS  2 Past Month at Unknown time  . FOLATE-B12-INTRINSIC FACTOR PO Take 1 tablet by mouth daily.   12/16/2015  . lamoTRIgine (LAMICTAL) 150 MG tablet Take 2 tablets (300 mg total) by mouth daily. 60 tablet 0 12/18/2015 at Unknown time  . levocetirizine (XYZAL) 5 MG tablet Take 5 mg by mouth every evening.   12/18/2015 at Unknown time  . Multiple Vitamin (MULTIVITAMIN WITH MINERALS) TABS Take 1 tablet by mouth daily.   12/16/2015  . Omega-3 Fatty Acids (FISH OIL PO) Take 1  capsule by mouth daily.    12/16/2015  . OSPHENA 60 MG TABS TAKE 1 TABLET BY MOUTH EVERY DAY 30 tablet 13 12/18/2015 at Unknown time  . Probiotic Product (PROBIOTIC DAILY PO) Take 1 tablet by mouth daily.   12/16/2015  . simvastatin (ZOCOR) 20 MG tablet Take 20 mg by mouth every evening.   12/18/2015 at Unknown time  . traZODone (DESYREL) 100 MG tablet Take 2 tablets by mouth at bedtime.  2 12/18/2015 at Unknown time  . VITAMIN C, CALCIUM ASCORBATE, PO Take 1 tablet by mouth daily.   12/16/2015  . VITAMIN D, CHOLECALCIFEROL, PO Take 1 tablet by mouth daily.    12/16/2015  . Magnesium 100 MG CAPS Take 1 capsule by mouth daily.    Unknown at Unknown time    Review of Systems  All other systems reviewed and are negative.   Blood pressure (!) 101/43, pulse 63, temperature 97.8 F (36.6 C), temperature source Oral, resp. rate 16, last menstrual period 10/18/2010, SpO2 100 %. Physical Exam  Constitutional: She is oriented to person, place, and time. She appears well-developed and well-nourished.  Cardiovascular: Normal rate and regular rhythm.   Respiratory: Effort normal and breath sounds normal.  Neurological: She is alert and oriented to person, place, and time.  Psychiatric: She has a normal mood and affect.    No results found for this or any previous visit (from the past 24 hour(s)).  No results found.  Assessment/Plan: 60 yo G0 MWF with endometrial cells on pap smear and endometrial polyp and ultrasound here for hysteroscopy, polyp resection, D&C.  All questions answered.  Pt ready to proceed.  Hale Bogus SUZANNE 12/19/2015, 7:08 AM

## 2015-12-21 ENCOUNTER — Encounter (HOSPITAL_COMMUNITY): Payer: Self-pay | Admitting: Obstetrics & Gynecology

## 2015-12-26 DIAGNOSIS — F3181 Bipolar II disorder: Secondary | ICD-10-CM | POA: Diagnosis not present

## 2015-12-28 DIAGNOSIS — F411 Generalized anxiety disorder: Secondary | ICD-10-CM | POA: Diagnosis not present

## 2015-12-28 DIAGNOSIS — F3181 Bipolar II disorder: Secondary | ICD-10-CM | POA: Diagnosis not present

## 2016-01-02 ENCOUNTER — Ambulatory Visit (INDEPENDENT_AMBULATORY_CARE_PROVIDER_SITE_OTHER): Payer: BLUE CROSS/BLUE SHIELD | Admitting: Obstetrics & Gynecology

## 2016-01-02 ENCOUNTER — Encounter: Payer: Self-pay | Admitting: Obstetrics & Gynecology

## 2016-01-02 VITALS — BP 110/68 | HR 70 | Resp 12 | Ht 70.5 in | Wt 162.2 lb

## 2016-01-02 DIAGNOSIS — N840 Polyp of corpus uteri: Secondary | ICD-10-CM | POA: Diagnosis not present

## 2016-01-02 NOTE — Progress Notes (Signed)
Post Operative Visit  Procedure: DILATATION & CURETTAGE/HYSTEROSCOPY WITH MYOSURE (N/A Vagina ) Days Post-op: 14 days   Subjective: Doing well.  Having minimal spotting.  Emptying bladder well.  No issues with BMs.  Objective: BP 110/68 (BP Location: Right Arm, Patient Position: Sitting, Cuff Size: Normal)   Pulse 70   Resp 12   Ht 5' 10.5" (1.791 m)   Wt 162 lb 3.2 oz (73.6 kg)   LMP 10/18/2010   BMI 22.94 kg/m   EXAM General: alert and appears stated age Resp: clear to auscultation bilaterally Cardio: regular rate and rhythm, S1, S2 normal, no murmur, click, rub or gallop GI: soft, non-tender; bowel sounds normal; no masses,  no organomegaly Extremities: extremities normal, atraumatic, no cyanosis or edema Vaginal Bleeding: none  Gyn:  NAEFG, vagina pink, cervix closed, no CMT  Assessment: s/p hysteroscopy, polyp resection, and D&C  Plan: Follow up for AEX.  Pt will call with any bleeding or new issues.

## 2016-01-31 ENCOUNTER — Encounter: Payer: Self-pay | Admitting: Obstetrics & Gynecology

## 2016-01-31 DIAGNOSIS — F3181 Bipolar II disorder: Secondary | ICD-10-CM | POA: Diagnosis not present

## 2016-03-01 DIAGNOSIS — F3181 Bipolar II disorder: Secondary | ICD-10-CM | POA: Diagnosis not present

## 2016-04-17 DIAGNOSIS — Z5181 Encounter for therapeutic drug level monitoring: Secondary | ICD-10-CM | POA: Diagnosis not present

## 2016-04-17 DIAGNOSIS — R5382 Chronic fatigue, unspecified: Secondary | ICD-10-CM | POA: Diagnosis not present

## 2016-04-17 DIAGNOSIS — E559 Vitamin D deficiency, unspecified: Secondary | ICD-10-CM | POA: Diagnosis not present

## 2016-04-17 DIAGNOSIS — E611 Iron deficiency: Secondary | ICD-10-CM | POA: Diagnosis not present

## 2016-04-17 DIAGNOSIS — E538 Deficiency of other specified B group vitamins: Secondary | ICD-10-CM | POA: Diagnosis not present

## 2016-04-17 DIAGNOSIS — Z1159 Encounter for screening for other viral diseases: Secondary | ICD-10-CM | POA: Diagnosis not present

## 2016-04-25 ENCOUNTER — Other Ambulatory Visit: Payer: Self-pay | Admitting: Family Medicine

## 2016-04-25 DIAGNOSIS — R109 Unspecified abdominal pain: Secondary | ICD-10-CM

## 2016-04-27 DIAGNOSIS — F411 Generalized anxiety disorder: Secondary | ICD-10-CM | POA: Diagnosis not present

## 2016-04-27 DIAGNOSIS — F3181 Bipolar II disorder: Secondary | ICD-10-CM | POA: Diagnosis not present

## 2016-04-30 DIAGNOSIS — L821 Other seborrheic keratosis: Secondary | ICD-10-CM | POA: Diagnosis not present

## 2016-04-30 DIAGNOSIS — H61001 Unspecified perichondritis of right external ear: Secondary | ICD-10-CM | POA: Diagnosis not present

## 2016-05-02 ENCOUNTER — Other Ambulatory Visit: Payer: Self-pay

## 2016-05-04 DIAGNOSIS — F3181 Bipolar II disorder: Secondary | ICD-10-CM | POA: Diagnosis not present

## 2016-05-25 DIAGNOSIS — Z79899 Other long term (current) drug therapy: Secondary | ICD-10-CM | POA: Diagnosis not present

## 2016-05-25 DIAGNOSIS — F3181 Bipolar II disorder: Secondary | ICD-10-CM | POA: Diagnosis not present

## 2016-05-25 DIAGNOSIS — F411 Generalized anxiety disorder: Secondary | ICD-10-CM | POA: Diagnosis not present

## 2016-06-18 DIAGNOSIS — F3181 Bipolar II disorder: Secondary | ICD-10-CM | POA: Diagnosis not present

## 2016-06-29 DIAGNOSIS — F411 Generalized anxiety disorder: Secondary | ICD-10-CM | POA: Diagnosis not present

## 2016-06-29 DIAGNOSIS — F3181 Bipolar II disorder: Secondary | ICD-10-CM | POA: Diagnosis not present

## 2016-06-29 DIAGNOSIS — Z79899 Other long term (current) drug therapy: Secondary | ICD-10-CM | POA: Diagnosis not present

## 2016-07-03 DIAGNOSIS — F3181 Bipolar II disorder: Secondary | ICD-10-CM | POA: Diagnosis not present

## 2016-07-24 DIAGNOSIS — F3181 Bipolar II disorder: Secondary | ICD-10-CM | POA: Diagnosis not present

## 2016-07-29 ENCOUNTER — Other Ambulatory Visit: Payer: Self-pay | Admitting: Obstetrics & Gynecology

## 2016-07-30 NOTE — Telephone Encounter (Signed)
Medication refill request: Osphena  Last AEX:  09-30-15  Next AEX: 12-28-16  Last MMG (if hormonal medication request): 11-16-15 WNL  Refill authorized: please advise

## 2016-08-03 DIAGNOSIS — F3181 Bipolar II disorder: Secondary | ICD-10-CM | POA: Diagnosis not present

## 2016-08-03 DIAGNOSIS — F411 Generalized anxiety disorder: Secondary | ICD-10-CM | POA: Diagnosis not present

## 2016-08-03 DIAGNOSIS — Z79899 Other long term (current) drug therapy: Secondary | ICD-10-CM | POA: Diagnosis not present

## 2016-08-21 DIAGNOSIS — D239 Other benign neoplasm of skin, unspecified: Secondary | ICD-10-CM | POA: Diagnosis not present

## 2016-08-21 DIAGNOSIS — D1800 Hemangioma unspecified site: Secondary | ICD-10-CM | POA: Diagnosis not present

## 2016-08-21 DIAGNOSIS — L821 Other seborrheic keratosis: Secondary | ICD-10-CM | POA: Diagnosis not present

## 2016-08-21 DIAGNOSIS — L814 Other melanin hyperpigmentation: Secondary | ICD-10-CM | POA: Diagnosis not present

## 2016-08-23 ENCOUNTER — Other Ambulatory Visit: Payer: Self-pay | Admitting: Obstetrics & Gynecology

## 2016-08-23 NOTE — Telephone Encounter (Signed)
Medication refill request: osphena  Last AEX:  09/30/15 SM  Next AEX: 12/28/16 SM  Last MMG (if hormonal medication request): 11/16/15 BIRADS1:Neg  Refill authorized: 07/30/16 #30/12R. To [facility] [place]

## 2016-09-26 DIAGNOSIS — Z79899 Other long term (current) drug therapy: Secondary | ICD-10-CM | POA: Diagnosis not present

## 2016-09-26 DIAGNOSIS — F411 Generalized anxiety disorder: Secondary | ICD-10-CM | POA: Diagnosis not present

## 2016-09-26 DIAGNOSIS — F3181 Bipolar II disorder: Secondary | ICD-10-CM | POA: Diagnosis not present

## 2016-09-28 DIAGNOSIS — F3181 Bipolar II disorder: Secondary | ICD-10-CM | POA: Diagnosis not present

## 2016-09-28 DIAGNOSIS — Z79899 Other long term (current) drug therapy: Secondary | ICD-10-CM | POA: Diagnosis not present

## 2016-09-28 DIAGNOSIS — F411 Generalized anxiety disorder: Secondary | ICD-10-CM | POA: Diagnosis not present

## 2016-10-02 DIAGNOSIS — F3181 Bipolar II disorder: Secondary | ICD-10-CM | POA: Diagnosis not present

## 2016-10-26 DIAGNOSIS — F3181 Bipolar II disorder: Secondary | ICD-10-CM | POA: Diagnosis not present

## 2016-10-26 DIAGNOSIS — Z79899 Other long term (current) drug therapy: Secondary | ICD-10-CM | POA: Diagnosis not present

## 2016-10-26 DIAGNOSIS — F411 Generalized anxiety disorder: Secondary | ICD-10-CM | POA: Diagnosis not present

## 2016-11-01 DIAGNOSIS — F3181 Bipolar II disorder: Secondary | ICD-10-CM | POA: Diagnosis not present

## 2016-11-16 DIAGNOSIS — Z1231 Encounter for screening mammogram for malignant neoplasm of breast: Secondary | ICD-10-CM | POA: Diagnosis not present

## 2016-11-21 ENCOUNTER — Encounter: Payer: Self-pay | Admitting: Obstetrics & Gynecology

## 2016-11-23 DIAGNOSIS — F411 Generalized anxiety disorder: Secondary | ICD-10-CM | POA: Diagnosis not present

## 2016-11-23 DIAGNOSIS — F3181 Bipolar II disorder: Secondary | ICD-10-CM | POA: Diagnosis not present

## 2016-12-04 DIAGNOSIS — F3181 Bipolar II disorder: Secondary | ICD-10-CM | POA: Diagnosis not present

## 2016-12-24 DIAGNOSIS — Z5181 Encounter for therapeutic drug level monitoring: Secondary | ICD-10-CM | POA: Diagnosis not present

## 2016-12-24 DIAGNOSIS — Z0000 Encounter for general adult medical examination without abnormal findings: Secondary | ICD-10-CM | POA: Diagnosis not present

## 2016-12-24 DIAGNOSIS — E559 Vitamin D deficiency, unspecified: Secondary | ICD-10-CM | POA: Diagnosis not present

## 2016-12-24 DIAGNOSIS — Z23 Encounter for immunization: Secondary | ICD-10-CM | POA: Diagnosis not present

## 2016-12-24 DIAGNOSIS — E785 Hyperlipidemia, unspecified: Secondary | ICD-10-CM | POA: Diagnosis not present

## 2016-12-24 DIAGNOSIS — R7303 Prediabetes: Secondary | ICD-10-CM | POA: Diagnosis not present

## 2016-12-28 ENCOUNTER — Ambulatory Visit: Payer: BLUE CROSS/BLUE SHIELD | Admitting: Obstetrics & Gynecology

## 2016-12-28 ENCOUNTER — Telehealth: Payer: Self-pay | Admitting: Obstetrics & Gynecology

## 2016-12-28 NOTE — Progress Notes (Deleted)
61 y.o. G0P0000 MarriedCaucasianF here for annual exam.    Patient's last menstrual period was 10/18/2010.          Sexually active: {yes no:314532}  The current method of family planning is post menopausal status.    Exercising: {yes no:314532}  {types:19826} Smoker:  no  Health Maintenance: Pap:  09/30/15 negative, HR HPV positive, 16/18/45 negative, 07/15/13 negative  History of abnormal Pap:  yes MMG:  11/16/16 BIRADS 1 negative  Colonoscopy:  12/2012 Dr. Collene Mares, repeat 10 years  BMD:   11/06/13 normal, repeat 3-5 years  TDaP:  UTD Pneumonia vaccine(s):  *** Zostavax:   2016 with PCP Hep C testing: *** Screening Labs: ***, Hb today: ***, Urine today: ***   reports that she has never smoked. She has never used smokeless tobacco. She reports that she does not drink alcohol or use drugs.  Past Medical History:  Diagnosis Date  . Anemia    history of  . Anxiety    does not take daily unless needed  . Arthritis    bilateral hands  . Basal cell carcinoma   . Bipolar 1 disorder ([place])       . 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 Heart murmur   . History of blood transfusion    age 22 years old  . History of periodontal disease   . Pain in gums 01/16/2013  . Pre-diabetes   . Sleep apnea    no CPAP prescribed, sleeps on sides    Past Surgical History:  Procedure Laterality Date  . ANTERIOR CERVICAL DECOMP/DISCECTOMY FUSION  09/17/2011   Procedure: ANTERIOR CERVICAL DECOMPRESSION/DISCECTOMY FUSION 2 LEVELS;  Surgeon: Hosie Spangle, MD;  Location: [facility];  Service: Neurosurgery;  Laterality: Bilateral;  Cervical five-six, six-seven anterior cervical decompression with fusion plating and bonegraft  . BASAL CELL CARCINOMA EXCISION  2010  . COLONOSCOPY  2014  . DILATATION & CURETTAGE/HYSTEROSCOPY WITH MYOSURE N/A 12/19/2015   Procedure: DILATATION & CURETTAGE/HYSTEROSCOPY WITH MYOSURE;  Surgeon: Megan Salon, MD;  Location: [place];  Service: Gynecology;  Laterality: N/A;   . ENDOMETRIAL BIOPSY    . GANGLION CYST EXCISION     right wrist  . TONSILLECTOMY      Current Outpatient Prescriptions  Medication Sig Dispense Refill  . Acetylcarnitine HCl (ACETYL L-CARNITINE) 500 MG CAPS Take 1 capsule by mouth daily.    . ARIPiprazole (ABILIFY) 30 MG tablet Take 1 tablet by mouth at bedtime.  1  . aspirin 81 MG tablet Take 81 mg by mouth daily.    . B Complex-Folic Acid (U-314 BALANCED TR PO) Take 1 tablet by mouth daily.    . busPIRone (BUSPAR) 30 MG tablet Take 30 mg by mouth 2 (two) times daily.  2  . calcium-vitamin D (OSCAL) 250-125 MG-UNIT per tablet Take 1 tablet by mouth daily.    . clonazePAM (KLONOPIN) 1 MG tablet Take 0.5 tablets by mouth 2 (two) times daily as needed for anxiety.   0  . Coenzyme Q10 (COQ10 PO) Take 1 capsule by mouth daily.    . fluticasone (CUTIVATE) 0.05 % cream APPLY TO AFFECTED AREA TWICE A DAY AS NEEDED FOR DERMATITIS  2  . FOLATE-B12-INTRINSIC FACTOR PO Take 1 tablet by mouth daily.    Marland Kitchen ibuprofen (ADVIL,MOTRIN) 800 MG tablet Take 800 mg by mouth every 8 (eight) hours as needed. for pain  0  . lamoTRIgine (LAMICTAL) 150 MG tablet Take 2 tablets (300 mg total)  by mouth daily. 60 tablet 0  . levocetirizine (XYZAL) 5 MG tablet Take 5 mg by mouth every evening.    . Magnesium 100 MG CAPS Take 1 capsule by mouth daily.     . Multiple Vitamin (MULTIVITAMIN WITH MINERALS) TABS Take 1 tablet by mouth daily.    . Omega-3 Fatty Acids (FISH OIL PO) Take 1 capsule by mouth daily.     . OSPHENA 60 MG TABS TAKE 1 TABLET BY MOUTH EVERY DAY 30 tablet 12  . Probiotic Product (PROBIOTIC DAILY PO) Take 1 tablet by mouth daily.    . simvastatin (ZOCOR) 20 MG tablet Take 20 mg by mouth every evening.    . traZODone (DESYREL) 100 MG tablet Take 2 tablets by mouth at bedtime.  2  . VITAMIN C, CALCIUM ASCORBATE, PO Take 1 tablet by mouth daily.    Marland Kitchen VITAMIN D, CHOLECALCIFEROL, PO Take 1 tablet by mouth daily.      No current facility-administered  medications for this visit.     Family History  Problem Relation Age of Onset  . Dementia Mother 41  . Rheum arthritis Mother   . Alcohol abuse Father   . Anxiety disorder Father   . Depression Father   . Dementia Father 44  . Paranoid behavior Father   . CAD Father   . Depression Sister   . Alcohol abuse Brother   . Bipolar disorder Brother   . Drug abuse Brother   . Sexual abuse Paternal Grandfather   . Depression Brother   . Depression Sister   . OCD Neg Hx   . Schizophrenia Neg Hx   . Thyroid disease Neg Hx     ROS:  Pertinent items are noted in HPI.  Otherwise, a comprehensive ROS was negative.  Exam:   LMP 10/18/2010   Weight change: @WEIGHTCHANGE @ Height:      Ht Readings from Last 3 Encounters:  01/02/16 5' 10.5" (1.791 m)  12/16/15 5' 10.5" (1.791 m)  11/24/15 5' 10.5" (1.791 m)    General appearance: alert, cooperative and appears stated age Head: Normocephalic, without obvious abnormality, atraumatic Neck: no adenopathy, supple, symmetrical, trachea midline and thyroid {EXAM; THYROID:18604} Lungs: clear to auscultation bilaterally Breasts: {Exam; breast:13139::"normal appearance, no masses or tenderness"} Heart: regular rate and rhythm Abdomen: soft, non-tender; bowel sounds normal; no masses,  no organomegaly Extremities: extremities normal, atraumatic, no cyanosis or edema Skin: Skin color, texture, turgor normal. No rashes or lesions Lymph nodes: Cervical, supraclavicular, and axillary nodes normal. No abnormal inguinal nodes palpated Neurologic: Grossly normal   Pelvic: External genitalia:  no lesions              Urethra:  normal appearing urethra with no masses, tenderness or lesions              Bartholins and Skenes: normal                 Vagina: normal appearing vagina with normal color and discharge, no lesions              Cervix: {exam; cervix:14595}              Pap taken: {yes no:314532} Bimanual Exam:  Uterus:  {exam; uterus:12215}               Adnexa: {exam; adnexa:12223}               Rectovaginal: Confirms  Anus:  normal sphincter tone, no lesions  Chaperone was present for exam.  A:  Well Woman with normal exam  P:   {plan; gyn:5269::"mammogram","pap smear","return annually or prn"}

## 2016-12-28 NOTE — Telephone Encounter (Signed)
Patient missed her AEX today with Dr. Sabra Heck. I called her and rescheduled to Monday, 12/31/16. She did not keep her appointment today due to inclement weather.

## 2016-12-31 ENCOUNTER — Encounter: Payer: Self-pay | Admitting: Obstetrics & Gynecology

## 2016-12-31 ENCOUNTER — Ambulatory Visit (INDEPENDENT_AMBULATORY_CARE_PROVIDER_SITE_OTHER): Payer: BLUE CROSS/BLUE SHIELD | Admitting: Obstetrics & Gynecology

## 2016-12-31 ENCOUNTER — Other Ambulatory Visit (HOSPITAL_COMMUNITY)
Admission: RE | Admit: 2016-12-31 | Discharge: 2016-12-31 | Disposition: A | Discharge status: Home / Self Care | Payer: BLUE CROSS/BLUE SHIELD | Source: Ambulatory Visit | Attending: Obstetrics & Gynecology | Admitting: Obstetrics & Gynecology

## 2016-12-31 VITALS — BP 98/56 | HR 84 | Resp 16 | Ht 70.75 in | Wt 154.0 lb

## 2016-12-31 DIAGNOSIS — Z124 Encounter for screening for malignant neoplasm of cervix: Secondary | ICD-10-CM | POA: Insufficient documentation

## 2016-12-31 DIAGNOSIS — N898 Other specified noninflammatory disorders of vagina: Secondary | ICD-10-CM

## 2016-12-31 DIAGNOSIS — Z01419 Encounter for gynecological examination (general) (routine) without abnormal findings: Secondary | ICD-10-CM | POA: Diagnosis not present

## 2016-12-31 MED ORDER — ESTROGENS, CONJUGATED 0.625 MG/GM VA CREA
TOPICAL_CREAM | VAGINAL | 6 refills | Status: DC
Start: 2016-12-31 — End: 2018-03-07

## 2016-12-31 MED ORDER — ESTRADIOL-NORETHINDRONE ACET 0.05-0.14 MG/DAY TD PTTW: 1.0000 | MEDICATED_PATCH | TRANSDERMAL | 3 refills | Status: DC

## 2016-12-31 NOTE — Patient Instructions (Signed)
New shingles vaccine is called Shingrix.  If you decide you want this, just let me know and I will send you a prescription order in the mail.

## 2016-12-31 NOTE — Progress Notes (Addendum)
61 y.o. G0P0000 Married Caucasian F here for annual exam.  Reports she is having issues with a yellowish vaginal discharge.  Pt reports that she does not feel the osphena is helping.  Has been on this for about three years and this medication just hasn't really helped like she hoped.  She did feel it helped initially but she is now getting the generic and this hasn't helped.  Symptoms are back where there were three years ago.  She cannot use a vaginal products that need to be inserted due to issues with placing something vaginally.  Have tried to address this through psychiatry and through pelvic PT without success.   Both of her parents are in [place] now.  They are both on medicaid.  House is going into foreclosure.  She reports this has really caused a change in her sleep.  On Trazodone.  Feeling a lot more fatigue.  Has five other siblings but she is the one who is responsible for her parents.  She still has their house to clear out.    Has lost weight since I saw her last.  Had braces last year.  Feels this contributed.  She has also been exercising more regularly.    Patient's last menstrual period was 10/18/2010.          Sexually active: Yes.    The current method of family planning is post menopausal status.    Exercising: Yes.    weights, cardio  Smoker:  No   Health Maintenance: Pap:  09/30/15 negative, HR HPV positive, 10/06/15: 16/18/45 negative, 07/15/13 negative History of abnormal Pap:  yes MMG:  11/16/16 BIRADS 1 negative  Colonoscopy:  10/14-Dr. Collene Mares, repeat 10 years  BMD:   11/06/13 normal, repeat 3-5 years  TDaP:  UTD  Pneumonia vaccine(s):  never Zostavax:   2016 with PCP  Hep C testing: done with PCP  Screening Labs: PCP, Hb today: PCP   reports that she has never smoked. She has never used smokeless tobacco. She reports that she does not drink alcohol or use drugs.  Past Medical History:  Diagnosis Date  . Anemia    history of  . Anxiety    does not take daily  unless needed  . Arthritis    bilateral hands  . Basal cell carcinoma   . Bipolar 1 disorder ([place])       . 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 Heart murmur   . History of blood transfusion    age 52 years old  . History of periodontal disease   . Pain in gums 01/16/2013  . Pre-diabetes   . Sleep apnea    no CPAP prescribed, sleeps on sides    Past Surgical History:  Procedure Laterality Date  . ANTERIOR CERVICAL DECOMP/DISCECTOMY FUSION  09/17/2011   Procedure: ANTERIOR CERVICAL DECOMPRESSION/DISCECTOMY FUSION 2 LEVELS;  Surgeon: Hosie Spangle, MD;  Location: [facility];  Service: Neurosurgery;  Laterality: Bilateral;  Cervical five-six, six-seven anterior cervical decompression with fusion plating and bonegraft  . BASAL CELL CARCINOMA EXCISION  2010  . COLONOSCOPY  2014  . DILATATION & CURETTAGE/HYSTEROSCOPY WITH MYOSURE N/A 12/19/2015   Procedure: DILATATION & CURETTAGE/HYSTEROSCOPY WITH MYOSURE;  Surgeon: Megan Salon, MD;  Location: [facility];  Service: Gynecology;  Laterality: N/A;  . ENDOMETRIAL BIOPSY    . GANGLION CYST EXCISION     right wrist  . TONSILLECTOMY      Current Outpatient Prescriptions  Medication  Sig Dispense Refill  . Acetylcarnitine HCl (ACETYL L-CARNITINE) 500 MG CAPS Take 1 capsule by mouth daily.    . ARIPiprazole (ABILIFY) 30 MG tablet Take 1 tablet by mouth at bedtime.  1  . aspirin 81 MG tablet Take 81 mg by mouth daily.    . B Complex-Folic Acid (Q-469 BALANCED TR PO) Take 1 tablet by mouth daily.    . busPIRone (BUSPAR) 30 MG tablet Take 30 mg by mouth 2 (two) times daily.  2  . clonazePAM (KLONOPIN) 1 MG tablet Take 0.5 tablets by mouth 2 (two) times daily as needed for anxiety.   0  . Coenzyme Q10 (COQ10 PO) Take 1 capsule by mouth daily.    . fluticasone (CUTIVATE) 0.05 % cream APPLY TO AFFECTED AREA TWICE A DAY AS NEEDED FOR DERMATITIS  2  . FOLATE-B12-INTRINSIC FACTOR PO Take 1 tablet by mouth daily.    Marland Kitchen ibuprofen (ADVIL,MOTRIN) 800  MG tablet Take 800 mg by mouth every 8 (eight) hours as needed. for pain  0  . lamoTRIgine (LAMICTAL) 150 MG tablet Take 2 tablets (300 mg total) by mouth daily. 60 tablet 0  . levocetirizine (XYZAL) 5 MG tablet Take 5 mg by mouth every evening.    . Multiple Vitamin (MULTIVITAMIN WITH MINERALS) TABS Take 1 tablet by mouth daily.    . Omega-3 Fatty Acids (FISH OIL PO) Take 1 capsule by mouth daily.     . OSPHENA 60 MG TABS TAKE 1 TABLET BY MOUTH EVERY DAY 30 tablet 12  . Probiotic Product (PROBIOTIC DAILY PO) Take 1 tablet by mouth daily.    . simvastatin (ZOCOR) 20 MG tablet Take 20 mg by mouth every evening.    . traZODone (DESYREL) 100 MG tablet TAKE 2 TO 2&1/2 TABLETS BY MOUTH AT BEDTIME AS NEEDED FOR INSOMNIA    . VITAMIN C, CALCIUM ASCORBATE, PO Take 1 tablet by mouth daily.    Marland Kitchen VITAMIN D, CHOLECALCIFEROL, PO Take 1 tablet by mouth daily.      No current facility-administered medications for this visit.     Family History  Problem Relation Age of Onset  . Dementia Mother 57  . Rheum arthritis Mother   . Alcohol abuse Father   . Anxiety disorder Father   . Depression Father   . Dementia Father 54  . Paranoid behavior Father   . CAD Father   . Depression Sister   . Alcohol abuse Brother   . Bipolar disorder Brother   . Drug abuse Brother   . Sexual abuse Paternal Grandfather   . Depression Brother   . Depression Sister   . OCD Neg Hx   . Schizophrenia Neg Hx   . Thyroid disease Neg Hx     ROS:  Pertinent items are noted in HPI.  Otherwise, a comprehensive ROS was negative.  Exam:   BP (!) 98/56 (BP Location: Right Arm, Patient Position: Sitting, Cuff Size: Normal)   Pulse 84   Resp 16   Ht 5' 10.75" (1.797 m)   Wt 154 lb (69.9 kg)   LMP 10/18/2010   BMI 21.63 kg/m   Weight change: -15# (purposeful)  Height: 5' 10.75" (179.7 cm)  Ht Readings from Last 3 Encounters:  12/31/16 5' 10.75" (1.797 m)  01/02/16 5' 10.5" (1.791 m)  12/16/15 5' 10.5" (1.791 m)     General appearance: alert, cooperative and appears stated age Head: Normocephalic, without obvious abnormality, atraumatic Neck: no adenopathy, supple, symmetrical, trachea midline and thyroid normal  to inspection and palpation Lungs: clear to auscultation bilaterally Breasts: normal appearance, no masses or tenderness Heart: regular rate and rhythm Abdomen: soft, non-tender; bowel sounds normal; no masses,  no organomegaly Extremities: extremities normal, atraumatic, no cyanosis or edema Skin: Skin color, texture, turgor normal. No rashes or lesions Lymph nodes: Cervical, supraclavicular, and axillary nodes normal. No abnormal inguinal nodes palpated Neurologic: Grossly normal   Pelvic: External genitalia:  no lesions              Urethra:  normal appearing urethra with no masses, tenderness or lesions              Bartholins and Skenes: normal                 Vagina: normal appearing vagina with yellowish discharge with odor, no lesions but tissue is very atrophic              Cervix: no lesions              Pap taken: Yes.   Bimanual Exam:  Uterus:  normal size, contour, position, consistency, mobility, non-tender              Adnexa: normal adnexa and no mass, fullness, tenderness               Rectovaginal: Confirms               Anus:  normal sphincter tone, no lesions  Chaperone was present for exam.  A:  Well Woman with normal exam Significant vaginal atrophic changes Dyspareunia Bipolar d/o H/O endometrial polyp that was removed last year H/O +HR HPV 2017 with neg 16/18/45 testing  P:   Mammogram guidelines reviewe pap smear and HR HPV obtained today. Decided to start low dosage HRT to see if can help symptoms.  Risks, benefits, alternatives discussed.  Risks of DVT, PE, Stroke, MI, breast cancer all reviewed.  Will also have pt use small amount of premarin cream topically and externally nightly to every other night depending on her comfort level. Lab work done  with Maurice Small a few weeks ago.  Pt states she will ask for results to be sent to me Shingrix vaccination discussed.  Pt declines this today but wants to do some research. Vaginitis swab Recheck 2-3 months. return annually or prn

## 2016-12-31 NOTE — Addendum Note (Signed)
Addended by: Megan Salon on: 12/31/2016 02:18 PM   Modules accepted: Orders

## 2017-01-02 LAB — CYTOLOGY - PAP
Diagnosis: NEGATIVE
HPV: NOT DETECTED

## 2017-01-03 LAB — NUSWAB VAGINITIS PLUS (VG+)
CANDIDA ALBICANS, NAA: NEGATIVE
CANDIDA GLABRATA, NAA: NEGATIVE
CHLAMYDIA TRACHOMATIS, NAA: NEGATIVE
Neisseria gonorrhoeae, NAA: NEGATIVE
Trich vag by NAA: NEGATIVE

## 2017-01-07 ENCOUNTER — Telehealth: Payer: Self-pay | Admitting: *Deleted

## 2017-01-07 DIAGNOSIS — F3181 Bipolar II disorder: Secondary | ICD-10-CM | POA: Diagnosis not present

## 2017-01-07 NOTE — Telephone Encounter (Signed)
Spoke with Panther Burn at [facility] in [place]. States she will fax PA for Combipatch to our office.

## 2017-01-08 NOTE — Telephone Encounter (Signed)
Cathy from [facility] in [place] called stating she is having trouble getting the fax to go through. I confirmed our fax number she will continue to try. FYI only.

## 2017-01-08 NOTE — Telephone Encounter (Signed)
PA received from pharmacy and sent to plan through CoverMyMeds.

## 2017-01-11 ENCOUNTER — Other Ambulatory Visit: Payer: Self-pay | Admitting: Obstetrics & Gynecology

## 2017-01-11 MED ORDER — ESTRADIOL-LEVONORGESTREL 0.045-0.015 MG/DAY TD PTWK: 1.0000 | MEDICATED_PATCH | TRANSDERMAL | 12 refills | Status: DC

## 2017-01-11 NOTE — Telephone Encounter (Signed)
Rx for Climara pro patch 0.45 to pharmacy.  This is a once a week patch.

## 2017-01-11 NOTE — Telephone Encounter (Signed)
Combipatch has been denied. Please advise on alternative.

## 2017-01-11 NOTE — Telephone Encounter (Signed)
The following message was reviewed with patient via phone.

## 2017-01-18 ENCOUNTER — Telehealth: Payer: Self-pay | Admitting: Obstetrics & Gynecology

## 2017-01-18 ENCOUNTER — Telehealth: Payer: Self-pay

## 2017-01-18 NOTE — Telephone Encounter (Signed)
Per review of Cover My Meds. Request has been approved for Climapro. Effective from 01/18/2017 through 03/18/2038. Patient has been notified.  Routing to provider for final review. Patient agreeable to disposition. Will close encounter.

## 2017-01-18 NOTE — Telephone Encounter (Signed)
[facility]oke with [facility]. Rx for Climara Pro also needs PA.   PA submitted via cover my meds. Patient has been notified and is aware she will be contacted with information regarding approval or denial.  Routing to provider for final review. Patient agreeable to disposition. Will close encounter.

## 2017-01-18 NOTE — Telephone Encounter (Signed)
Patient is asking for the status of her prescription prior authorization?Marland Kitchen

## 2017-02-13 DIAGNOSIS — F3181 Bipolar II disorder: Secondary | ICD-10-CM | POA: Diagnosis not present

## 2017-02-13 DIAGNOSIS — Z79899 Other long term (current) drug therapy: Secondary | ICD-10-CM | POA: Diagnosis not present

## 2017-02-13 DIAGNOSIS — F411 Generalized anxiety disorder: Secondary | ICD-10-CM | POA: Diagnosis not present

## 2017-02-18 DIAGNOSIS — F3181 Bipolar II disorder: Secondary | ICD-10-CM | POA: Diagnosis not present

## 2017-03-01 ENCOUNTER — Ambulatory Visit: Payer: BLUE CROSS/BLUE SHIELD | Admitting: Obstetrics & Gynecology

## 2017-04-01 ENCOUNTER — Other Ambulatory Visit: Payer: Self-pay

## 2017-04-01 ENCOUNTER — Encounter: Payer: Self-pay | Admitting: Obstetrics & Gynecology

## 2017-04-01 ENCOUNTER — Ambulatory Visit (INDEPENDENT_AMBULATORY_CARE_PROVIDER_SITE_OTHER): Payer: BLUE CROSS/BLUE SHIELD | Admitting: Obstetrics & Gynecology

## 2017-04-01 VITALS — BP 108/64 | HR 84 | Resp 16 | Wt 158.0 lb

## 2017-04-01 DIAGNOSIS — N941 Unspecified dyspareunia: Secondary | ICD-10-CM

## 2017-04-01 DIAGNOSIS — Z79890 Hormone replacement therapy (postmenopausal): Secondary | ICD-10-CM | POA: Diagnosis not present

## 2017-04-01 DIAGNOSIS — S3141XA Laceration without foreign body of vagina and vulva, initial encounter: Secondary | ICD-10-CM | POA: Diagnosis not present

## 2017-04-01 DIAGNOSIS — N898 Other specified noninflammatory disorders of vagina: Secondary | ICD-10-CM | POA: Diagnosis not present

## 2017-04-01 NOTE — Progress Notes (Signed)
GYNECOLOGY  VISIT  CC:   dyspareunia  HPI: 62 y.o. G0P0000 Married Caucasian female here for complaint of vaginal dryness and continued painful intercourse.  Continues to state this only happened after menopause.  Using climara pro patch and cutting patches in half.  Feels more moisture but still with some dryness.  Attempted intercourse yesterday and felt like she "tore".  Denies vaginal bleeding. Did not look at self with mirror.  Feels uncomfortable with this idea.  Did see [name] in the past.  Pelvic PT really didn't help.  Reports [facility] did 'try really hard" but nothing.  Willing to try again with [facility].  Did use dilators in the past but stopped several years ago.  Primary pain is immediately with insertion.  Reports she never could use a tampon.  Doesn't really feel comfortable with touching self  Denies urinary symptoms.  Reports she is very discouraged with this.    GYNECOLOGIC HISTORY: Patient's last menstrual period was 10/18/2010. Contraception: PMP Menopausal hormone therapy: Climara pro patch  Patient Active Problem List   Diagnosis Date Noted  . Fibroids, intramural 11/27/2015  . Endometrial mass 11/27/2015  . Thickened endometrium 11/27/2015  . Non-atypical endometrial cells of cervix on Pap smear 11/27/2015  . Generalized anxiety disorder 10/03/2015  . Bipolar 2 disorder ([place]) 11/13/2013  . Bipolar I disorder, most recent episode (or current) depressed, severe, without mention of psychotic behavior 03/25/2013  . Pain in gums 01/16/2013  . Gingival and periodontal disease 01/16/2013    Past Medical History:  Diagnosis Date  . Anemia    history of  . Anxiety    does not take daily unless needed  . Arthritis    bilateral hands  . Basal cell carcinoma   . Bipolar 1 disorder ([place])       . 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 Heart murmur   . History of blood transfusion    age 59 years old  . History of periodontal disease   . Pain in gums  01/16/2013  . Pre-diabetes   . Sleep apnea    no CPAP prescribed, sleeps on sides    Past Surgical History:  Procedure Laterality Date  . ANTERIOR CERVICAL DECOMP/DISCECTOMY FUSION  09/17/2011   Procedure: ANTERIOR CERVICAL DECOMPRESSION/DISCECTOMY FUSION 2 LEVELS;  Surgeon: Hosie Spangle, MD;  Location: [facility];  Service: Neurosurgery;  Laterality: Bilateral;  Cervical five-six, six-seven anterior cervical decompression with fusion plating and bonegraft  . BASAL CELL CARCINOMA EXCISION  2010  . COLONOSCOPY  2014  . DILATATION & CURETTAGE/HYSTEROSCOPY WITH MYOSURE N/A 12/19/2015   Procedure: DILATATION & CURETTAGE/HYSTEROSCOPY WITH MYOSURE;  Surgeon: Megan Salon, MD;  Location: [facility];  Service: Gynecology;  Laterality: N/A;  . ENDOMETRIAL BIOPSY    . GANGLION CYST EXCISION     right wrist  . TONSILLECTOMY      MEDS:   Current Outpatient Medications on File Prior to Visit  Medication Sig Dispense Refill  . Acetylcarnitine HCl (ACETYL L-CARNITINE) 500 MG CAPS Take 1 capsule by mouth daily.    . ARIPiprazole (ABILIFY) 30 MG tablet Take 1 tablet by mouth at bedtime.  1  . aspirin 81 MG tablet Take 81 mg by mouth daily.    . B Complex-Folic Acid (J-884 BALANCED TR PO) Take 1 tablet by mouth daily.    . busPIRone (BUSPAR) 30 MG tablet Take 30 mg by mouth 2 (two) times daily.  2  . clonazePAM (KLONOPIN) 1  MG tablet Take 0.5 tablets by mouth 2 (two) times daily as needed for anxiety.   0  . Coenzyme Q10 (COQ10 PO) Take 1 capsule by mouth daily.    Marland Kitchen conjugated estrogens (PREMARIN) vaginal cream 1/2 gram vaginally twice weekly 30 g 6  . estradiol-levonorgestrel (CLIMARA PRO) 0.045-0.015 MG/DAY Place 1 patch onto the skin once a week. 4 patch 12  . fluticasone (CUTIVATE) 0.05 % cream APPLY TO AFFECTED AREA TWICE A DAY AS NEEDED FOR DERMATITIS  2  . FOLATE-B12-INTRINSIC FACTOR PO Take 1 tablet by mouth daily.    Marland Kitchen ibuprofen (ADVIL,MOTRIN) 800 MG tablet Take 800 mg by mouth every 8  (eight) hours as needed. for pain  0  . lamoTRIgine (LAMICTAL) 150 MG tablet Take 2 tablets (300 mg total) by mouth daily. 60 tablet 0  . levocetirizine (XYZAL) 5 MG tablet Take 5 mg by mouth every evening.    . Multiple Vitamin (MULTIVITAMIN WITH MINERALS) TABS Take 1 tablet by mouth daily.    . Omega-3 Fatty Acids (FISH OIL PO) Take 1 capsule by mouth daily.     . Probiotic Product (PROBIOTIC DAILY PO) Take 1 tablet by mouth daily.    . simvastatin (ZOCOR) 20 MG tablet Take 20 mg by mouth every evening.    . traZODone (DESYREL) 100 MG tablet TAKE 2 TO 2&1/2 TABLETS BY MOUTH AT BEDTIME AS NEEDED FOR INSOMNIA    . VITAMIN C, CALCIUM ASCORBATE, PO Take 1 tablet by mouth daily.    Marland Kitchen VITAMIN D, CHOLECALCIFEROL, PO Take 1 tablet by mouth daily.      No current facility-administered medications on file prior to visit.     ALLERGIES: Latex  Family History  Problem Relation Age of Onset  . Dementia Mother 61  . Rheum arthritis Mother   . Alcohol abuse Father   . Anxiety disorder Father   . Depression Father   . Dementia Father 64  . Paranoid behavior Father   . CAD Father   . Depression Sister   . Alcohol abuse Brother   . Bipolar disorder Brother   . Drug abuse Brother   . Sexual abuse Paternal Grandfather   . Depression Brother   . Depression Sister   . OCD Neg Hx   . Schizophrenia Neg Hx   . Thyroid disease Neg Hx     SH:  Married, non smoker  Review of Systems  Psychiatric/Behavioral: Positive for depression.  All other systems reviewed and are negative.   PHYSICAL EXAMINATION:    BP 108/64 (BP Location: Right Arm, Patient Position: Sitting, Cuff Size: Normal)   Pulse 84   Resp 16   Wt 158 lb (71.7 kg)   LMP 10/18/2010   BMI 22.19 kg/m     Physical Exam  Constitutional: She appears well-developed and well-nourished.  Genitourinary: Vagina normal.     Lymphadenopathy:       Left: No inguinal adenopathy present.  Neurological: She is alert. She has normal  reflexes.  Skin: Skin is warm and dry.  Psychiatric: She has a normal mood and affect.  Depressed mood   Chaperone was present for exam.  Assessment: Vaginal dryness Dyspareunia H/O depression  Plan: Willl increase patch to 0.05mg  dosing.  She will use 1 patch weekly.  Risks reviewed including including but not limited to risks of increased risks of heart disease, MI, stroke, DVT, and breast cancer.  Referral back to [facility] will be done.   ~Lengnty visit with pt that lasted 45  minutes with almost all of this in face to face time.  Possibly of vagnismus discussed.  Hopefully, dilators and pelvic PT will work better now that she is on transdermal estrogen

## 2017-04-02 ENCOUNTER — Telehealth: Payer: Self-pay | Admitting: Obstetrics & Gynecology

## 2017-04-02 DIAGNOSIS — F3181 Bipolar II disorder: Secondary | ICD-10-CM | POA: Diagnosis not present

## 2017-04-02 NOTE — Telephone Encounter (Signed)
Spoke with patient.   1. Calling for status of referral to [name], PT?  2. Seen on 04/01/17, has vaginal "tear", asking how long to wait before intercourse?   Advised patient will review with Dr. Sabra Heck and return call with recommendations, patient is agreeable.   Dr. Sabra Heck -please advise?

## 2017-04-02 NOTE — Telephone Encounter (Signed)
Patient checking on status of physical therapy referral.  Also wants to know how long she should refrain from sex to give the tear time to heal.

## 2017-04-03 NOTE — Telephone Encounter (Signed)
Spoke with Andrea Paul at [facility]. Patient scheduled with [name], PT on 04/17/17 at 11am. Fax OV notes to [phone].

## 2017-04-03 NOTE — Telephone Encounter (Signed)
Spoke with patient, advised of appt with [name], PT. Patient verbalizes understanding and is agreeable.

## 2017-04-03 NOTE — Telephone Encounter (Signed)
Spoke with patient, advised as seen below per Dr. Sabra Heck. Advised I will call to schedule appt with [name] and return call, patient is agreeable.

## 2017-04-03 NOTE — Telephone Encounter (Signed)
Please let her know that referral back to [facility] now having used estrogen is appropriate.  I don't think she will need another referral as has been seen before.  Can you call and make appt for pt.    Intercourse--wait one week to let heal.  Can look with mirror to be sure healed before trying again.

## 2017-04-03 NOTE — Telephone Encounter (Signed)
Spoke with patients spouse, Left message to call Sharee Pimple at [phone].

## 2017-04-03 NOTE — Telephone Encounter (Signed)
Patient returning Jill's call.  °

## 2017-04-03 NOTE — Telephone Encounter (Signed)
Spoke with spouse, advised to return call to [place] at [facility].

## 2017-04-03 NOTE — Telephone Encounter (Signed)
Return call to [place] [facility].

## 2017-04-05 ENCOUNTER — Ambulatory Visit: Payer: BLUE CROSS/BLUE SHIELD | Admitting: Podiatry

## 2017-04-05 VITALS — BP 117/74 | HR 92 | Ht 71.0 in | Wt 158.0 lb

## 2017-04-05 DIAGNOSIS — A499 Bacterial infection, unspecified: Secondary | ICD-10-CM | POA: Diagnosis not present

## 2017-04-05 DIAGNOSIS — B351 Tinea unguium: Secondary | ICD-10-CM | POA: Diagnosis not present

## 2017-04-05 DIAGNOSIS — L603 Nail dystrophy: Secondary | ICD-10-CM | POA: Diagnosis not present

## 2017-04-05 DIAGNOSIS — L608 Other nail disorders: Secondary | ICD-10-CM | POA: Diagnosis not present

## 2017-04-05 NOTE — Progress Notes (Signed)
Subjective:    Patient ID: Andrea Paul, female    DOB: [birth year], 62 y.o.   MRN: [id number]  HPI  Chief Complaint  Patient presents with  . Nail Problem    thick toenail on right great toe   62 y.o. female presents with the above complaint.  Reports thickened toenail to the right great toe present for many years.  States it is so bad now that she has difficulty cutting it and the nail has become painful.  Denies other pedal issues  Past Medical History:  Diagnosis Date  . Anemia    history of  . Anxiety    does not take daily unless needed  . Arthritis    bilateral hands  . Basal cell carcinoma   . Bipolar 1 disorder ([place])       . 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 Heart murmur   . History of blood transfusion    age 42 years old  . History of periodontal disease   . Pain in gums 01/16/2013  . Pre-diabetes   . Sleep apnea    no CPAP prescribed, sleeps on sides   Past Surgical History:  Procedure Laterality Date  . ANTERIOR CERVICAL DECOMP/DISCECTOMY FUSION  09/17/2011   Procedure: ANTERIOR CERVICAL DECOMPRESSION/DISCECTOMY FUSION 2 LEVELS;  Surgeon: Hosie Spangle, MD;  Location: [facility];  Service: Neurosurgery;  Laterality: Bilateral;  Cervical five-six, six-seven anterior cervical decompression with fusion plating and bonegraft  . BASAL CELL CARCINOMA EXCISION  2010  . COLONOSCOPY  2014  . DILATATION & CURETTAGE/HYSTEROSCOPY WITH MYOSURE N/A 12/19/2015   Procedure: DILATATION & CURETTAGE/HYSTEROSCOPY WITH MYOSURE;  Surgeon: Megan Salon, MD;  Location: [facility];  Service: Gynecology;  Laterality: N/A;  . ENDOMETRIAL BIOPSY    . GANGLION CYST EXCISION     right wrist  . TONSILLECTOMY      Current Outpatient Medications:  .  Acetylcarnitine HCl (ACETYL L-CARNITINE) 500 MG CAPS, Take 1 capsule by mouth daily., Disp: , Rfl:  .  ARIPiprazole (ABILIFY) 30 MG tablet, Take 1 tablet by mouth at bedtime., Disp: , Rfl: 1 .  aspirin 81 MG tablet, Take 81 mg by  mouth daily., Disp: , Rfl:  .  B Complex-Folic Acid (X-323 BALANCED TR PO), Take 1 tablet by mouth daily., Disp: , Rfl:  .  busPIRone (BUSPAR) 30 MG tablet, Take 30 mg by mouth 2 (two) times daily., Disp: , Rfl: 2 .  clonazePAM (KLONOPIN) 1 MG tablet, Take 0.5 tablets by mouth 2 (two) times daily as needed for anxiety. , Disp: , Rfl: 0 .  Coenzyme Q10 (COQ10 PO), Take 1 capsule by mouth daily., Disp: , Rfl:  .  conjugated estrogens (PREMARIN) vaginal cream, 1/2 gram vaginally twice weekly, Disp: 30 g, Rfl: 6 .  estradiol-levonorgestrel (CLIMARA PRO) 0.045-0.015 MG/DAY, Place 1 patch onto the skin once a week., Disp: 4 patch, Rfl: 12 .  fluticasone (CUTIVATE) 0.05 % cream, APPLY TO AFFECTED AREA TWICE A DAY AS NEEDED FOR DERMATITIS, Disp: , Rfl: 2 .  FOLATE-B12-INTRINSIC FACTOR PO, Take 1 tablet by mouth daily., Disp: , Rfl:  .  ibuprofen (ADVIL,MOTRIN) 800 MG tablet, Take 800 mg by mouth every 8 (eight) hours as needed. for pain, Disp: , Rfl: 0 .  lamoTRIgine (LAMICTAL) 150 MG tablet, Take 2 tablets (300 mg total) by mouth daily., Disp: 60 tablet, Rfl: 0 .  levocetirizine (XYZAL) 5 MG tablet, Take 5 mg by mouth every evening., Disp: ,  Rfl:  .  Multiple Vitamin (MULTIVITAMIN WITH MINERALS) TABS, Take 1 tablet by mouth daily., Disp: , Rfl:  .  Omega-3 Fatty Acids (FISH OIL PO), Take 1 capsule by mouth daily. , Disp: , Rfl:  .  Probiotic Product (PROBIOTIC DAILY PO), Take 1 tablet by mouth daily., Disp: , Rfl:  .  simvastatin (ZOCOR) 20 MG tablet, Take 20 mg by mouth every evening., Disp: , Rfl:  .  traZODone (DESYREL) 100 MG tablet, TAKE 2 TO 2&1/2 TABLETS BY MOUTH AT BEDTIME AS NEEDED FOR INSOMNIA, Disp: , Rfl:  .  VITAMIN C, CALCIUM ASCORBATE, PO, Take 1 tablet by mouth daily., Disp: , Rfl:  .  VITAMIN D, CHOLECALCIFEROL, PO, Take 1 tablet by mouth daily. , Disp: , Rfl:   Allergies  Allergen Reactions  . Latex Itching    Review of Systems  All other systems reviewed and are negative.        Objective:   Physical Exam Vitals:   04/05/17 1336  BP: 117/74  Pulse: 92   General AA&O x3. Normal mood and affect.  Vascular Dorsalis pedis and posterior tibial pulses  present 2+ bilaterally  Capillary refill normal to all digits. Pedal hair growth normal.  Neurologic Epicritic sensation grossly present.  Dermatologic No open lesions. Interspaces clear of maceration. Right hallux nail with thickening pain to palpation dystrophy transverse ridging  Orthopedic: MMT 5/5 in dorsiflexion, plantarflexion, inversion, and eversion. Normal joint ROM without pain or crepitus.      Assessment & Plan:  Patient was evaluated and treated all questions answered  Nail dystrophy/onychomycosis right great toe -Discussed possible etiology of changes to the right great toe.  Discussed that it could be due to microtrauma rather than fungus.  Patient would like to have the nail treated today advised of nail removal with sending the nail to the lab for possible identification of cause.  Patient elected to proceed see procedure note below  Procedure: Avulsion of toenail Location: Right 1st toe  Anesthesia: Lidocaine 1% plain; 1.48mL and Marcaine 0.5% plain; 1.39mL, digital block. Skin Prep: Betadine. Dressing: Silvadene; telfa; dry, sterile, compression dressing. Technique: Following skin prep, the toe was exsanguinated and a tourniquet was secured at the base of the toe.  The right hallux nail was loosened with a Freer and avulsed with a hemostat.  The area was cleansed dressed with antibiotic ointment and compression dressing tourniquet removed with good capillary refill. Disposition: Patient tolerated procedure well. Patient to return in 2 weeks for follow-up.

## 2017-04-05 NOTE — Patient Instructions (Signed)

## 2017-04-08 ENCOUNTER — Telehealth: Payer: Self-pay | Admitting: Podiatry

## 2017-04-08 NOTE — Telephone Encounter (Signed)
This message is for the nurse. I saw Dr. March Rummage on Friday 18 January and I had a toenail removed. Dr. March Rummage instructed me that I was to soak my foot in epsom salt twice a day but I forgot to get how many days I was supposed to do that, if there was a length of time. If you would give me a call and my phone number is [phone]. Thank you.

## 2017-04-08 NOTE — Telephone Encounter (Signed)
I spoke with pt's Husband, Jeneen Rinks and instructed pt is to soak the toe twice daily for 4-6 weeks until the area gets a dry hard scab without redness, swelling, drainage. Jeneen Rinks states pt has severe pain soaking in the epsom salt and wanted to know if could use an alternate. I told Jeneen Rinks pt could use white vinegar, but it might sting, or betadine which was messy and stained. I told him she would needed to test to stop the soaks at about the end of the 4th week, perform the last soak of the day, leave off the antibiotic ointment bandaid, allow to air dry, if the area got a dry hard scab without redness, pain, swelling, or drainage she could stop the soaks but if continued to perform the soaks another 2 weeks and test again. Jeneen Rinks states he will discuss with pt.

## 2017-04-08 NOTE — Telephone Encounter (Signed)
OV notes faxed to [facility].   Routing to provider for final review. Patient is agreeable to disposition. Will close encounter.

## 2017-04-17 DIAGNOSIS — R102 Pelvic and perineal pain: Secondary | ICD-10-CM | POA: Diagnosis not present

## 2017-04-17 DIAGNOSIS — M62838 Other muscle spasm: Secondary | ICD-10-CM | POA: Diagnosis not present

## 2017-04-17 DIAGNOSIS — M6281 Muscle weakness (generalized): Secondary | ICD-10-CM | POA: Diagnosis not present

## 2017-04-17 DIAGNOSIS — N952 Postmenopausal atrophic vaginitis: Secondary | ICD-10-CM | POA: Diagnosis not present

## 2017-04-18 DIAGNOSIS — Z79899 Other long term (current) drug therapy: Secondary | ICD-10-CM | POA: Diagnosis not present

## 2017-04-18 DIAGNOSIS — F411 Generalized anxiety disorder: Secondary | ICD-10-CM | POA: Diagnosis not present

## 2017-04-18 DIAGNOSIS — F3181 Bipolar II disorder: Secondary | ICD-10-CM | POA: Diagnosis not present

## 2017-04-19 ENCOUNTER — Ambulatory Visit: Payer: BLUE CROSS/BLUE SHIELD | Admitting: Podiatry

## 2017-04-19 DIAGNOSIS — B351 Tinea unguium: Secondary | ICD-10-CM | POA: Diagnosis not present

## 2017-04-19 DIAGNOSIS — L603 Nail dystrophy: Secondary | ICD-10-CM

## 2017-04-21 NOTE — Progress Notes (Signed)
  Subjective:  Patient ID: Andrea Paul, female    DOB: [birth year],  MRN: [id number]  Chief Complaint  Patient presents with  . nail check    F/U R great toenail Pt. denies pain, swelling, infection Tx: epsom slat soaking and Neosporin   62 y.o. female returns for the above complaint. Denies pain, swelling. Has been doing well.  Objective:   General AA&O x3. Normal mood and affect.  Vascular Foot warm and well perfused with good capillary refill.  Neurologic Sensation grossly intact.  Dermatologic Nail avulsion site healing well without drainage or erythema. Nail bed with overlying soft crust. Left intact. No signs of local infection.  Orthopedic: No tenderness to palpation of the toe.   Assessment & Plan:  Patient was evaluated and treated and all questions answered.  S/p Ingrown Toenail Excision, R -Path reviewed with patient.  No evidence of fungus nail changes due to trauma.  Discussed possible urea cream for the nail should the nail grow with continued issue -Healing well without issue. -Discussed return precautions. -F/u PRN

## 2017-04-22 ENCOUNTER — Telehealth: Payer: Self-pay | Admitting: Obstetrics & Gynecology

## 2017-04-22 ENCOUNTER — Other Ambulatory Visit: Payer: Self-pay | Admitting: Obstetrics & Gynecology

## 2017-04-22 MED ORDER — LIDOCAINE 5 % EX OINT: TOPICAL_OINTMENT | CUTANEOUS | 0 refills | Status: DC

## 2017-04-22 NOTE — Telephone Encounter (Addendum)
Spoke with patient. Patient states that she has been speaking with Dr.Miller about using lidocaine ointment for pain. States she is seeing [name] for pelvic PT who feels this may be beneficial. Advised will review with Dr.Miller and return call. Patient is using pharmacy on file.

## 2017-04-22 NOTE — Telephone Encounter (Signed)
Please let pt know that I send rx to pharmacy on file.  Should use a small amount (tip of finger or less in size) around vaginal opening just before intercourse.  She should not reapply and should use only a small amount.

## 2017-04-22 NOTE — Telephone Encounter (Signed)
Patient is asking to talk with Dr.Miller's nurse regarding her Lidocaine medication.

## 2017-04-23 DIAGNOSIS — R102 Pelvic and perineal pain: Secondary | ICD-10-CM | POA: Diagnosis not present

## 2017-04-23 DIAGNOSIS — M62838 Other muscle spasm: Secondary | ICD-10-CM | POA: Diagnosis not present

## 2017-04-23 DIAGNOSIS — N952 Postmenopausal atrophic vaginitis: Secondary | ICD-10-CM | POA: Diagnosis not present

## 2017-04-23 DIAGNOSIS — M6281 Muscle weakness (generalized): Secondary | ICD-10-CM | POA: Diagnosis not present

## 2017-04-23 NOTE — Telephone Encounter (Signed)
Call to patient. Message given to patient as seen below from Dr. Sabra Heck. Patient verbalized understanding. RN advised prescription sent to [facility] in [place] for patient. Patient agreeable.   Routing to provider for final review. Patient agreeable to disposition. Will close encounter.

## 2017-04-30 DIAGNOSIS — F3181 Bipolar II disorder: Secondary | ICD-10-CM | POA: Diagnosis not present

## 2017-05-01 DIAGNOSIS — N941 Unspecified dyspareunia: Secondary | ICD-10-CM | POA: Diagnosis not present

## 2017-05-01 DIAGNOSIS — M6281 Muscle weakness (generalized): Secondary | ICD-10-CM | POA: Diagnosis not present

## 2017-05-01 DIAGNOSIS — R102 Pelvic and perineal pain: Secondary | ICD-10-CM | POA: Diagnosis not present

## 2017-05-01 DIAGNOSIS — M62838 Other muscle spasm: Secondary | ICD-10-CM | POA: Diagnosis not present

## 2017-06-03 DIAGNOSIS — R238 Other skin changes: Secondary | ICD-10-CM | POA: Diagnosis not present

## 2017-06-03 DIAGNOSIS — D485 Neoplasm of uncertain behavior of skin: Secondary | ICD-10-CM | POA: Diagnosis not present

## 2017-06-03 DIAGNOSIS — H61001 Unspecified perichondritis of right external ear: Secondary | ICD-10-CM | POA: Diagnosis not present

## 2017-06-03 DIAGNOSIS — D2272 Melanocytic nevi of left lower limb, including hip: Secondary | ICD-10-CM | POA: Diagnosis not present

## 2017-06-03 DIAGNOSIS — L821 Other seborrheic keratosis: Secondary | ICD-10-CM | POA: Diagnosis not present

## 2017-06-05 DIAGNOSIS — M6281 Muscle weakness (generalized): Secondary | ICD-10-CM | POA: Diagnosis not present

## 2017-06-05 DIAGNOSIS — N952 Postmenopausal atrophic vaginitis: Secondary | ICD-10-CM | POA: Diagnosis not present

## 2017-06-05 DIAGNOSIS — R102 Pelvic and perineal pain: Secondary | ICD-10-CM | POA: Diagnosis not present

## 2017-06-05 DIAGNOSIS — M62838 Other muscle spasm: Secondary | ICD-10-CM | POA: Diagnosis not present

## 2017-06-20 ENCOUNTER — Ambulatory Visit: Payer: BLUE CROSS/BLUE SHIELD | Admitting: Podiatry

## 2017-06-26 DIAGNOSIS — F3181 Bipolar II disorder: Secondary | ICD-10-CM | POA: Diagnosis not present

## 2017-07-11 DIAGNOSIS — F411 Generalized anxiety disorder: Secondary | ICD-10-CM | POA: Diagnosis not present

## 2017-07-11 DIAGNOSIS — F3181 Bipolar II disorder: Secondary | ICD-10-CM | POA: Diagnosis not present

## 2017-07-11 DIAGNOSIS — Z79899 Other long term (current) drug therapy: Secondary | ICD-10-CM | POA: Diagnosis not present

## 2017-07-12 ENCOUNTER — Ambulatory Visit: Payer: BLUE CROSS/BLUE SHIELD | Admitting: Podiatry

## 2017-07-12 DIAGNOSIS — B351 Tinea unguium: Secondary | ICD-10-CM

## 2017-07-14 NOTE — Progress Notes (Signed)
  Subjective:  Patient ID: Andrea Paul, female    DOB: [birth year],  MRN: [id number]  Chief Complaint  Patient presents with  . Nail Problem    nail fungus follow up   62 y.o. female returns for the above complaint.  States that the nails are doing much better.  Objective:  There were no vitals filed for this visit. General AA&O x3. Normal mood and affect.  Vascular Pedal pulses palpable.  Neurologic Epicritic sensation grossly intact.  Dermatologic No open lesions. Skin normal texture and turgor.  Proximal nail growth noted  Orthopedic: No pain to palpation either foot.   Assessment & Plan:  Patient was evaluated and treated and all questions answered.  Onychomycosis -Improving.  Advised to follow-up should she not be satisfied with the results of her nail once it is fully grown in  Return if symptoms worsen or fail to improve.

## 2017-07-31 DIAGNOSIS — M6281 Muscle weakness (generalized): Secondary | ICD-10-CM | POA: Diagnosis not present

## 2017-07-31 DIAGNOSIS — N952 Postmenopausal atrophic vaginitis: Secondary | ICD-10-CM | POA: Diagnosis not present

## 2017-07-31 DIAGNOSIS — M62838 Other muscle spasm: Secondary | ICD-10-CM | POA: Diagnosis not present

## 2017-07-31 DIAGNOSIS — R102 Pelvic and perineal pain: Secondary | ICD-10-CM | POA: Diagnosis not present

## 2017-08-06 DIAGNOSIS — F3181 Bipolar II disorder: Secondary | ICD-10-CM | POA: Diagnosis not present

## 2017-08-27 DIAGNOSIS — L814 Other melanin hyperpigmentation: Secondary | ICD-10-CM | POA: Diagnosis not present

## 2017-08-27 DIAGNOSIS — D225 Melanocytic nevi of trunk: Secondary | ICD-10-CM | POA: Diagnosis not present

## 2017-08-27 DIAGNOSIS — L821 Other seborrheic keratosis: Secondary | ICD-10-CM | POA: Diagnosis not present

## 2017-08-27 DIAGNOSIS — L308 Other specified dermatitis: Secondary | ICD-10-CM | POA: Diagnosis not present

## 2017-09-10 DIAGNOSIS — F3181 Bipolar II disorder: Secondary | ICD-10-CM | POA: Diagnosis not present

## 2017-09-12 DIAGNOSIS — Z79899 Other long term (current) drug therapy: Secondary | ICD-10-CM | POA: Diagnosis not present

## 2017-09-12 DIAGNOSIS — F3181 Bipolar II disorder: Secondary | ICD-10-CM | POA: Diagnosis not present

## 2017-09-12 DIAGNOSIS — F411 Generalized anxiety disorder: Secondary | ICD-10-CM | POA: Diagnosis not present

## 2017-11-05 DIAGNOSIS — F3181 Bipolar II disorder: Secondary | ICD-10-CM | POA: Diagnosis not present

## 2017-11-20 DIAGNOSIS — Z1231 Encounter for screening mammogram for malignant neoplasm of breast: Secondary | ICD-10-CM | POA: Diagnosis not present

## 2017-11-26 ENCOUNTER — Encounter: Payer: Self-pay | Admitting: Obstetrics & Gynecology

## 2017-12-05 DIAGNOSIS — F3181 Bipolar II disorder: Secondary | ICD-10-CM | POA: Diagnosis not present

## 2017-12-05 DIAGNOSIS — F411 Generalized anxiety disorder: Secondary | ICD-10-CM | POA: Diagnosis not present

## 2017-12-05 DIAGNOSIS — Z79899 Other long term (current) drug therapy: Secondary | ICD-10-CM | POA: Diagnosis not present

## 2017-12-09 DIAGNOSIS — F3181 Bipolar II disorder: Secondary | ICD-10-CM | POA: Diagnosis not present

## 2018-01-06 DIAGNOSIS — F3181 Bipolar II disorder: Secondary | ICD-10-CM | POA: Diagnosis not present

## 2018-01-07 DIAGNOSIS — F411 Generalized anxiety disorder: Secondary | ICD-10-CM | POA: Diagnosis not present

## 2018-01-07 DIAGNOSIS — F3181 Bipolar II disorder: Secondary | ICD-10-CM | POA: Diagnosis not present

## 2018-01-14 ENCOUNTER — Encounter: Payer: Self-pay | Admitting: Obstetrics & Gynecology

## 2018-01-14 DIAGNOSIS — E559 Vitamin D deficiency, unspecified: Secondary | ICD-10-CM | POA: Diagnosis not present

## 2018-01-14 DIAGNOSIS — Z5181 Encounter for therapeutic drug level monitoring: Secondary | ICD-10-CM | POA: Diagnosis not present

## 2018-01-14 DIAGNOSIS — R7303 Prediabetes: Secondary | ICD-10-CM | POA: Diagnosis not present

## 2018-01-14 DIAGNOSIS — Z0000 Encounter for general adult medical examination without abnormal findings: Secondary | ICD-10-CM | POA: Diagnosis not present

## 2018-01-14 DIAGNOSIS — Z23 Encounter for immunization: Secondary | ICD-10-CM | POA: Diagnosis not present

## 2018-01-14 DIAGNOSIS — E785 Hyperlipidemia, unspecified: Secondary | ICD-10-CM | POA: Diagnosis not present

## 2018-01-28 DIAGNOSIS — F3181 Bipolar II disorder: Secondary | ICD-10-CM | POA: Diagnosis not present

## 2018-02-04 DIAGNOSIS — F411 Generalized anxiety disorder: Secondary | ICD-10-CM | POA: Diagnosis not present

## 2018-03-07 ENCOUNTER — Ambulatory Visit (INDEPENDENT_AMBULATORY_CARE_PROVIDER_SITE_OTHER): Payer: BLUE CROSS/BLUE SHIELD | Admitting: Obstetrics & Gynecology

## 2018-03-07 ENCOUNTER — Other Ambulatory Visit (HOSPITAL_COMMUNITY)
Admission: RE | Admit: 2018-03-07 | Discharge: 2018-03-07 | Disposition: A | Discharge status: Home / Self Care | Payer: BLUE CROSS/BLUE SHIELD | Source: Ambulatory Visit | Attending: Obstetrics & Gynecology | Admitting: Obstetrics & Gynecology

## 2018-03-07 ENCOUNTER — Encounter: Payer: Self-pay | Admitting: Obstetrics & Gynecology

## 2018-03-07 ENCOUNTER — Encounter

## 2018-03-07 VITALS — BP 112/70 | HR 84 | Resp 16 | Ht 70.5 in | Wt 168.0 lb

## 2018-03-07 DIAGNOSIS — Z01419 Encounter for gynecological examination (general) (routine) without abnormal findings: Secondary | ICD-10-CM | POA: Diagnosis not present

## 2018-03-07 DIAGNOSIS — B977 Papillomavirus as the cause of diseases classified elsewhere: Secondary | ICD-10-CM | POA: Insufficient documentation

## 2018-03-07 DIAGNOSIS — N898 Other specified noninflammatory disorders of vagina: Secondary | ICD-10-CM

## 2018-03-07 MED ORDER — ESTROGENS, CONJUGATED 0.625 MG/GM VA CREA: TOPICAL_CREAM | VAGINAL | 2 refills | Status: DC

## 2018-03-07 NOTE — Progress Notes (Signed)
62 y.o. [place] Mar[place] White or Caucasian female here for annual exam.  Continues to have painful intercourse.  Has been able to use a vibrating dilator.  Using Lidocaine.  Felt the increase in the patch dosage didn't help at all so she stopped it.  Intercourse is not "fun".  Are SA about once a month.  Relationship is good.    Denies vaginal bleeding.  Has gotten a flu shot.    PCP:  Dr. Justin Mend . Blood work was good in October.    Patient's last menstrual period was 10/18/2010.          Sexually active: Yes.    The current method of family planning is post menopausal status.    Exercising: Yes.    cardio 3 x weekly  (1 hour each day) Smoker:  no  Health Maintenance: Pap:  12/31/16 Neg. HR HPV:neg   09/30/15 Neg. HR HPV:+detected. # 16, 18/45 Neg  History of abnormal Pap:  yes MMG:  11/20/17 BIRADS1:neg Colonoscopy:  2014 normal. F/u 10 years  BMD:   11/06/13 Normal  TDaP:  PCP Pneumonia vaccine(s):  n/a Shingrix:  D/w pt vaccination today.  She declines today.   Hep C testing: PCP Screening Labs: PCP   reports that she has never smoked. She has never used smokeless tobacco. She reports that she does not drink alcohol or use drugs.  Past Medical History:  Diagnosis Date  . Anemia    history of  . Anxiety    does not take daily unless needed  . Arthritis    bilateral hands  . Basal cell carcinoma   . Bipolar 1 disorder ([place])       . 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 Heart murmur   . History of blood transfusion    age 45 years old  . History of periodontal disease   . Pain in gums 01/16/2013  . Pre-diabetes   . Sleep apnea    no CPAP prescribed, sleeps on sides    Past Surgical History:  Procedure Laterality Date  . ANTERIOR CERVICAL DECOMP/DISCECTOMY FUSION  09/17/2011   Procedure: ANTERIOR CERVICAL DECOMPRESSION/DISCECTOMY FUSION 2 LEVELS;  Surgeon: Hosie Spangle, MD;  Location: [facility];  Service: Neurosurgery;  Laterality: Bilateral;  Cervical five-six,  six-seven anterior cervical decompression with fusion plating and bonegraft  . BASAL CELL CARCINOMA EXCISION  2010  . COLONOSCOPY  2014  . DILATATION & CURETTAGE/HYSTEROSCOPY WITH MYOSURE N/A 12/19/2015   Procedure: DILATATION & CURETTAGE/HYSTEROSCOPY WITH MYOSURE;  Surgeon: Megan Salon, MD;  Location: [facility];  Service: Gynecology;  Laterality: N/A;  . ENDOMETRIAL BIOPSY    . GANGLION CYST EXCISION     right wrist  . TONSILLECTOMY      Current Outpatient Medications  Medication Sig Dispense Refill  . Acetylcarnitine HCl (ACETYL L-CARNITINE) 500 MG CAPS Take 1 capsule by mouth daily.    . ARIPiprazole (ABILIFY) 20 MG tablet Take 1 tablet by mouth daily.    Marland Kitchen aspirin 81 MG tablet Take 81 mg by mouth daily.    . B Complex-Folic Acid (B-284 BALANCED TR PO) Take 1 tablet by mouth daily.    . busPIRone (BUSPAR) 30 MG tablet Take 30 mg by mouth 2 (two) times daily.  2  . cetirizine (ZYRTEC) 10 MG tablet Take 10 mg by mouth daily.    . clonazePAM (KLONOPIN) 0.5 MG tablet Take 0.5 tablets by mouth daily as needed.    . Coenzyme  Q10 (COQ10 PO) Take 1 capsule by mouth daily.    Marland Kitchen conjugated estrogens (PREMARIN) vaginal cream 1/2 gram vaginally twice weekly 30 g 6  . fluticasone (CUTIVATE) 0.05 % cream APPLY TO AFFECTED AREA TWICE A DAY AS NEEDED FOR DERMATITIS  2  . FOLATE-B12-INTRINSIC FACTOR PO Take 1 tablet by mouth daily.    Marland Kitchen ibuprofen (ADVIL,MOTRIN) 800 MG tablet Take 800 mg by mouth every 8 (eight) hours as needed. for pain  0  . lamoTRIgine (LAMICTAL) 150 MG tablet Take 2 tablets (300 mg total) by mouth daily. 60 tablet 0  . lidocaine (XYLOCAINE) 5 % ointment Apply a small amount topically as directed before intercourse. 1.25 g 0  . Multiple Vitamin (MULTIVITAMIN WITH MINERALS) TABS Take 1 tablet by mouth daily.    . Omega-3 Fatty Acids (FISH OIL PO) Take 1 capsule by mouth daily.     . Probiotic Product (PROBIOTIC DAILY PO) Take 1 tablet by mouth daily.    . simvastatin (ZOCOR) 20 MG  tablet Take 20 mg by mouth every evening.    . traZODone (DESYREL) 100 MG tablet TAKE 2 TO 2&1/2 TABLETS BY MOUTH AT BEDTIME AS NEEDED FOR INSOMNIA    . VITAMIN C, CALCIUM ASCORBATE, PO Take 1 tablet by mouth daily.    Marland Kitchen VITAMIN D, CHOLECALCIFEROL, PO Take 1 tablet by mouth daily.      No current facility-administered medications for this visit.     Family History  Problem Relation Age of Onset  . Dementia Mother 7  . Rheum arthritis Mother   . Alcohol abuse Father   . Anxiety disorder Father   . Depression Father   . Dementia Father 61  . Paranoid behavior Father   . CAD Father   . Depression Sister   . Alcohol abuse Brother   . Bipolar disorder Brother   . Drug abuse Brother   . Sexual abuse Paternal Grandfather   . Depression Brother   . Depression Sister   . OCD Neg Hx   . Schizophrenia Neg Hx   . Thyroid disease Neg Hx     Review of Systems  All other systems reviewed and are negative.   Exam:   BP 112/70 (BP Location: Right Arm, Patient Position: Sitting, Cuff Size: Large)   Pulse 84   Resp 16   Ht 5' 10.5" (1.791 m)   Wt 168 lb (76.2 kg)   LMP 10/18/2010   BMI 23.76 kg/m   Height: 5' 10.5" (179.1 cm)  Ht Readings from Last 3 Encounters:  03/07/18 5' 10.5" (1.791 m)  04/05/17 5\' 11"  (1.803 m)  12/31/16 5' 10.75" (1.797 m)    General appearance: alert, cooperative and appears stated age Head: Normocephalic, without obvious abnormality, atraumatic Neck: no adenopathy, supple, symmetrical, trachea midline and thyroid normal to inspection and palpation Lungs: clear to auscultation bilaterally Breasts: normal appearance, no masses or tenderness Heart: regular rate and rhythm Abdomen: soft, non-tender; bowel sounds normal; no masses,  no organomegaly Extremities: extremities normal, atraumatic, no cyanosis or edema Skin: Skin color, texture, turgor normal. No rashes or lesions Lymph nodes: Cervical, supraclavicular, and axillary nodes normal. No abnormal  inguinal nodes palpated Neurologic: Grossly normal   Pelvic: External genitalia:  no lesions              Urethra:  normal appearing urethra with no masses, tenderness or lesions              Bartholins and Skenes: normal  Vagina: normal appearing vagina with normal color and discharge, no lesions              Cervix: no lesions              Pap taken: Yes.   Bimanual Exam:  Uterus:  normal size, contour, position, consistency, mobility, non-tender              Adnexa: normal adnexa and no mass, fullness, tenderness               Rectovaginal: Confirms               Anus:  normal sphincter tone, no lesions  Chaperone was present for exam.  A:  Well Woman with normal exam PMP, no HRT Dyspareunia that is manageable Bipolar d/o H/O +HR HPV 2017 with neg 16/18/45 testing  P:   Mammogram guidelines reviewed.  Doing yearly. Release of records for labs and vaccines will be signed pap smear with HR HPV obtained today Lab work is UTD Vaccines UTD except shingrix.  Pt declines shingrix today. return annually or prn

## 2018-03-13 LAB — CYTOLOGY - PAP
Diagnosis: NEGATIVE
HPV: NOT DETECTED

## 2018-03-18 ENCOUNTER — Telehealth: Payer: Self-pay

## 2018-03-18 NOTE — Telephone Encounter (Signed)
Called patient and notified her that her last t-dap  Was done 04/15/2008 and her next one will be need to be done after 04/15/18. Patient verbalized understanding.

## 2018-03-31 DIAGNOSIS — H00011 Hordeolum externum right upper eyelid: Secondary | ICD-10-CM | POA: Diagnosis not present

## 2018-04-04 DIAGNOSIS — F5105 Insomnia due to other mental disorder: Secondary | ICD-10-CM | POA: Diagnosis not present

## 2018-04-04 DIAGNOSIS — F3181 Bipolar II disorder: Secondary | ICD-10-CM | POA: Diagnosis not present

## 2018-04-04 DIAGNOSIS — F411 Generalized anxiety disorder: Secondary | ICD-10-CM | POA: Diagnosis not present

## 2018-04-08 DIAGNOSIS — F411 Generalized anxiety disorder: Secondary | ICD-10-CM | POA: Diagnosis not present

## 2018-05-08 DIAGNOSIS — F411 Generalized anxiety disorder: Secondary | ICD-10-CM | POA: Diagnosis not present

## 2018-05-15 DIAGNOSIS — F5105 Insomnia due to other mental disorder: Secondary | ICD-10-CM | POA: Diagnosis not present

## 2018-05-15 DIAGNOSIS — F411 Generalized anxiety disorder: Secondary | ICD-10-CM | POA: Diagnosis not present

## 2018-05-15 DIAGNOSIS — F3181 Bipolar II disorder: Secondary | ICD-10-CM | POA: Diagnosis not present

## 2018-07-24 DIAGNOSIS — F5105 Insomnia due to other mental disorder: Secondary | ICD-10-CM | POA: Diagnosis not present

## 2018-07-24 DIAGNOSIS — F3181 Bipolar II disorder: Secondary | ICD-10-CM | POA: Diagnosis not present

## 2018-07-24 DIAGNOSIS — F411 Generalized anxiety disorder: Secondary | ICD-10-CM | POA: Diagnosis not present

## 2018-08-28 DIAGNOSIS — L821 Other seborrheic keratosis: Secondary | ICD-10-CM | POA: Diagnosis not present

## 2018-08-28 DIAGNOSIS — L814 Other melanin hyperpigmentation: Secondary | ICD-10-CM | POA: Diagnosis not present

## 2018-08-28 DIAGNOSIS — D1801 Hemangioma of skin and subcutaneous tissue: Secondary | ICD-10-CM | POA: Diagnosis not present

## 2018-08-28 DIAGNOSIS — D229 Melanocytic nevi, unspecified: Secondary | ICD-10-CM | POA: Diagnosis not present

## 2018-10-24 DIAGNOSIS — F3181 Bipolar II disorder: Secondary | ICD-10-CM | POA: Diagnosis not present

## 2018-10-24 DIAGNOSIS — F411 Generalized anxiety disorder: Secondary | ICD-10-CM | POA: Diagnosis not present

## 2018-10-24 DIAGNOSIS — F5105 Insomnia due to other mental disorder: Secondary | ICD-10-CM | POA: Diagnosis not present

## 2018-11-19 ENCOUNTER — Telehealth: Payer: Self-pay | Admitting: Obstetrics & Gynecology

## 2018-11-19 NOTE — Telephone Encounter (Signed)
Attempted to reach patient on home telephone number. Line continuously rang with no answer.   Patient's last BMD was 11-06-13, with repeat in 3-5 years.   BMD order filled out and placed on Dr. Ammie Ferrier desk for review and signature to fax to [facility].

## 2018-11-19 NOTE — Telephone Encounter (Signed)
Patient is asking when will she need another BMD? She said if yes, she would like the   order to be for a screening BMD.

## 2018-11-19 NOTE — Telephone Encounter (Signed)
Order for BMD reviewed and signed by Dr. Sabra Heck and faxed to [facility]. Fax number: [phone].

## 2018-11-21 NOTE — Telephone Encounter (Signed)
Patient is asking for her BMD to be scheduled as screening.

## 2018-11-25 NOTE — Telephone Encounter (Signed)
Call placed to [facility] at [facility]. If not first BMD, BMD is diagnostic. Also have option of self-pay for BMD, out of pocket $40.   Spoke with patient. Patient states she was advised by [facility] call center that BMD should be ordered as screening to be covered under plan. Advised as seen above. Patient to check with her insurance provider for coverage, Dx: postmenopausal, no longer on HRT. Advised to return call if any additional assistance needed. Questions answered.   Routing to provider for final review. Patient is agreeable to disposition. Will close encounter.

## 2018-12-16 DIAGNOSIS — Z23 Encounter for immunization: Secondary | ICD-10-CM | POA: Diagnosis not present

## 2019-01-02 DIAGNOSIS — F411 Generalized anxiety disorder: Secondary | ICD-10-CM | POA: Diagnosis not present

## 2019-01-02 DIAGNOSIS — F3181 Bipolar II disorder: Secondary | ICD-10-CM | POA: Diagnosis not present

## 2019-01-02 DIAGNOSIS — F5105 Insomnia due to other mental disorder: Secondary | ICD-10-CM | POA: Diagnosis not present

## 2019-02-02 DIAGNOSIS — F411 Generalized anxiety disorder: Secondary | ICD-10-CM | POA: Diagnosis not present

## 2019-02-16 DIAGNOSIS — Z1231 Encounter for screening mammogram for malignant neoplasm of breast: Secondary | ICD-10-CM | POA: Diagnosis not present

## 2019-03-12 DIAGNOSIS — F411 Generalized anxiety disorder: Secondary | ICD-10-CM | POA: Diagnosis not present

## 2019-03-12 DIAGNOSIS — F3181 Bipolar II disorder: Secondary | ICD-10-CM | POA: Diagnosis not present

## 2019-03-12 DIAGNOSIS — F5105 Insomnia due to other mental disorder: Secondary | ICD-10-CM | POA: Diagnosis not present

## 2019-03-23 ENCOUNTER — Encounter

## 2019-03-23 ENCOUNTER — Encounter: Payer: Self-pay | Admitting: Obstetrics & Gynecology

## 2019-03-23 ENCOUNTER — Ambulatory Visit (INDEPENDENT_AMBULATORY_CARE_PROVIDER_SITE_OTHER): Payer: BC Managed Care – PPO | Admitting: Obstetrics & Gynecology

## 2019-03-23 ENCOUNTER — Other Ambulatory Visit: Payer: Self-pay

## 2019-03-23 VITALS — BP 120/76 | HR 80 | Temp 97.2°F | Ht 70.5 in | Wt 171.0 lb

## 2019-03-23 DIAGNOSIS — Z01419 Encounter for gynecological examination (general) (routine) without abnormal findings: Secondary | ICD-10-CM

## 2019-03-23 NOTE — Patient Instructions (Addendum)
Call us in late October so we can help facilitate your mammogram and bone density test.  Will plan this after 02/23/2020.    Please double check with Dr. Justin Mend about your tetanus because I do not have documentation about when you had your last one

## 2019-03-23 NOTE — Progress Notes (Signed)
64 y.o. G0P0000 Married White or Caucasian female here for annual exam.  Denies vaginal bleeding.  Continues to have painful intercourse.  She has used vibrating dilators.  Has decreased libido as well.  Has done physical therapy at least twice.  She did try a topical lidocaine as well.  Not using this as well.   She did get a flu shot.    PCP:  Dr. Maurice Small.  Has appt in February.  Patient's last menstrual period was 10/18/2010.          Sexually active: Yes.    The current method of family planning is post menopausal status.    Exercising: Yes.    walking Smoker:  no  Health Maintenance: Pap:   03/07/18 Neg:Neg HR HPV   12/31/16 Neg. HR HPV:neg              09/30/15 Neg. HR HPV:+detected. # 16, 18/45 Neg History of abnormal Pap:  yes MMG:  02/23/19 BIRADS 2 benign Colonoscopy:  2014 normal. F/u 10 years  BMD:   11/06/13 Normal TDaP:  PCP Pneumonia vaccine(s):  none Shingrix:   Zostavax 08/28/13 Hep C testing: PCP Screening Labs: PCP   reports that she has never smoked. She has never used smokeless tobacco. She reports that she does not drink alcohol or use drugs.  Past Medical History:  Diagnosis Date  . Anemia    history of  . Anxiety    does not take daily unless needed  . Arthritis    bilateral hands  . Basal cell carcinoma   . Bipolar 1 disorder ([place])       . 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 Heart murmur   . History of blood transfusion    age 29 years old  . History of periodontal disease   . Pain in gums 01/16/2013  . Pre-diabetes   . Sleep apnea    no CPAP prescribed, sleeps on sides    Past Surgical History:  Procedure Laterality Date  . ANTERIOR CERVICAL DECOMP/DISCECTOMY FUSION  09/17/2011   Procedure: ANTERIOR CERVICAL DECOMPRESSION/DISCECTOMY FUSION 2 LEVELS;  Surgeon: Hosie Spangle, MD;  Location: [facility];  Service: Neurosurgery;  Laterality: Bilateral;  Cervical five-six, six-seven anterior cervical decompression with fusion plating and  bonegraft  . BASAL CELL CARCINOMA EXCISION  2010  . COLONOSCOPY  2014  . DILATATION & CURETTAGE/HYSTEROSCOPY WITH MYOSURE N/A 12/19/2015   Procedure: DILATATION & CURETTAGE/HYSTEROSCOPY WITH MYOSURE;  Surgeon: Megan Salon, MD;  Location: [facility];  Service: Gynecology;  Laterality: N/A;  . ENDOMETRIAL BIOPSY    . GANGLION CYST EXCISION     right wrist  . TONSILLECTOMY      Current Outpatient Medications  Medication Sig Dispense Refill  . Acetylcarnitine HCl (ACETYL L-CARNITINE) 500 MG CAPS Take 1 capsule by mouth daily.    . ARIPiprazole (ABILIFY) 20 MG tablet Take 1 tablet by mouth daily.    Marland Kitchen aspirin 81 MG tablet Take 81 mg by mouth daily.    . B Complex-Folic Acid (A999333 BALANCED TR PO) Take 1 tablet by mouth daily.    . clonazePAM (KLONOPIN) 0.5 MG tablet Take by mouth.    . Coenzyme Q10 (COQ10 PO) Take 1 capsule by mouth daily.    Marland Kitchen escitalopram (LEXAPRO) 20 MG tablet Take by mouth.    . fluticasone (CUTIVATE) 0.05 % cream APPLY TO AFFECTED AREA TWICE A DAY AS NEEDED FOR DERMATITIS  2  . FOLATE-B12-INTRINSIC FACTOR  PO Take 1 tablet by mouth daily.    Marland Kitchen ibuprofen (ADVIL,MOTRIN) 800 MG tablet Take 800 mg by mouth every 8 (eight) hours as needed. for pain  0  . lamoTRIgine (LAMICTAL) 150 MG tablet Take 2 tablets (300 mg total) by mouth daily. 60 tablet 0  . Multiple Vitamin (MULTIVITAMIN WITH MINERALS) TABS Take 1 tablet by mouth daily.    . Omega-3 Fatty Acids (FISH OIL PO) Take 1 capsule by mouth daily.     . Probiotic Product (PROBIOTIC DAILY PO) Take 1 tablet by mouth daily.    . simvastatin (ZOCOR) 20 MG tablet Take 20 mg by mouth every evening.    . traZODone (DESYREL) 100 MG tablet TAKE 2 TO 2&1/2 TABLETS BY MOUTH AT BEDTIME AS NEEDED FOR INSOMNIA    . VITAMIN C, CALCIUM ASCORBATE, PO Take 1 tablet by mouth daily.    Marland Kitchen VITAMIN D, CHOLECALCIFEROL, PO Take 1 tablet by mouth daily.      No current facility-administered medications for this visit.    Family History  Problem  Relation Age of Onset  . Dementia Mother 25  . Rheum arthritis Mother   . Alcohol abuse Father   . Anxiety disorder Father   . Depression Father   . Dementia Father 65  . Paranoid behavior Father   . CAD Father   . Depression Sister   . Alcohol abuse Brother   . Bipolar disorder Brother   . Drug abuse Brother   . Sexual abuse Paternal Grandfather   . Depression Brother   . Depression Sister   . OCD Neg Hx   . Schizophrenia Neg Hx   . Thyroid disease Neg Hx     Review of Systems  All other systems reviewed and are negative.   Exam:   BP 120/76 (BP Location: Left Arm, Patient Position: Sitting, Cuff Size: Normal)   Pulse 80   Temp (!) 97.2 F (36.2 C) (Temporal)   Ht 5' 10.5" (1.791 m)   Wt 171 lb (77.6 kg)   LMP 10/18/2010   BMI 24.19 kg/m   Height: 5' 10.5" (179.1 cm)  Ht Readings from Last 3 Encounters:  03/23/19 5' 10.5" (1.791 m)  03/07/18 5' 10.5" (1.791 m)  04/05/17 5\' 11"  (1.803 m)   General appearance: alert, cooperative and appears stated age Head: Normocephalic, without obvious abnormality, atraumatic Neck: no adenopathy, supple, symmetrical, trachea midline and thyroid normal to inspection and palpation Lungs: clear to auscultation bilaterally Breasts: normal appearance, no masses or tenderness Heart: regular rate and rhythm Abdomen: soft, non-tender; bowel sounds normal; no masses,  no organomegaly Extremities: extremities normal, atraumatic, no cyanosis or edema Skin: Skin color, texture, turgor normal. No rashes or lesions Lymph nodes: Cervical, supraclavicular, and axillary nodes normal. No abnormal inguinal nodes palpated Neurologic: Grossly normal   Pelvic: External genitalia:  no lesions              Urethra:  normal appearing urethra with no masses, tenderness or lesions              Bartholins and Skenes: normal                 Vagina: normal appearing vagina with normal color and discharge, no lesions              Cervix: no lesions               Pap taken: No. Bimanual Exam:  Uterus:  normal size, contour, position, consistency,  mobility, non-tender              Adnexa: normal adnexa and no mass, fullness, tenderness               Rectovaginal: Confirms               Anus:  normal sphincter tone, no lesions  Chaperone, Terence Lux, CMA, was present for exam.  A:  Well Woman with normal exam PMP, no HRT Dyspareunia Bipolar d/o H/o +HR HPV 2017 with neg 16/1845 testing.  Neg pap and neg HR HPV 2018 and 2019  P:   Mammogram guidelines reviewed.  Doing these yearly. pap smear not indicated Colonoscopy is UTD Plan BMD with next MMG Will review with Dr. Justin Mend to be sure her Tdap is UTD Lab work planned 04/2019 Return annually or prn

## 2019-04-03 DIAGNOSIS — F411 Generalized anxiety disorder: Secondary | ICD-10-CM | POA: Diagnosis not present

## 2019-04-14 DIAGNOSIS — M940 Chondrocostal junction syndrome [Tietze]: Secondary | ICD-10-CM | POA: Diagnosis not present

## 2019-04-21 ENCOUNTER — Other Ambulatory Visit: Payer: Self-pay | Admitting: Family Medicine

## 2019-04-21 ENCOUNTER — Ambulatory Visit
Admission: RE | Admit: 2019-04-21 | Discharge: 2019-04-21 | Disposition: A | Discharge status: Home / Self Care | Payer: BC Managed Care – PPO | Source: Ambulatory Visit | Attending: Family Medicine | Admitting: Family Medicine

## 2019-04-21 DIAGNOSIS — M940 Chondrocostal junction syndrome [Tietze]: Secondary | ICD-10-CM

## 2019-04-21 DIAGNOSIS — N644 Mastodynia: Secondary | ICD-10-CM | POA: Diagnosis not present

## 2019-04-21 DIAGNOSIS — M25512 Pain in left shoulder: Secondary | ICD-10-CM | POA: Diagnosis not present

## 2019-05-11 DIAGNOSIS — Z5181 Encounter for therapeutic drug level monitoring: Secondary | ICD-10-CM | POA: Diagnosis not present

## 2019-05-11 DIAGNOSIS — E785 Hyperlipidemia, unspecified: Secondary | ICD-10-CM | POA: Diagnosis not present

## 2019-05-11 DIAGNOSIS — R7303 Prediabetes: Secondary | ICD-10-CM | POA: Diagnosis not present

## 2019-05-12 DIAGNOSIS — F411 Generalized anxiety disorder: Secondary | ICD-10-CM | POA: Diagnosis not present

## 2019-05-14 DIAGNOSIS — Z0000 Encounter for general adult medical examination without abnormal findings: Secondary | ICD-10-CM | POA: Diagnosis not present

## 2019-05-29 DIAGNOSIS — F411 Generalized anxiety disorder: Secondary | ICD-10-CM | POA: Diagnosis not present

## 2019-05-29 DIAGNOSIS — F3181 Bipolar II disorder: Secondary | ICD-10-CM | POA: Diagnosis not present

## 2019-05-29 DIAGNOSIS — F5105 Insomnia due to other mental disorder: Secondary | ICD-10-CM | POA: Diagnosis not present

## 2019-06-03 DIAGNOSIS — R5382 Chronic fatigue, unspecified: Secondary | ICD-10-CM | POA: Diagnosis not present

## 2019-06-03 DIAGNOSIS — E538 Deficiency of other specified B group vitamins: Secondary | ICD-10-CM | POA: Diagnosis not present

## 2019-06-12 ENCOUNTER — Ambulatory Visit: Payer: BC Managed Care – PPO | Attending: Internal Medicine

## 2019-06-12 DIAGNOSIS — Z23 Encounter for immunization: Secondary | ICD-10-CM

## 2019-06-12 NOTE — Progress Notes (Signed)
   Covid-19 Vaccination Clinic  Name:  Andrea Paul    MRN: [id number] DOB: 02-Feb-1956  06/12/2019  [name] was observed post Covid-19 immunization for 15 minutes without incident. She was provided with Vaccine Information Sheet and instruction to access the V-Safe system.   [name] was instructed to call 911 with any severe reactions post vaccine: Marland Kitchen Difficulty breathing  . Swelling of face and throat  . A fast heartbeat  . A bad rash all over body  . Dizziness and weakness   Immunizations Administered    Name Date Dose VIS Date Route   Moderna COVID-19 Vaccine 06/12/2019 10:36 AM 0.5 mL 02/17/2019 Intramuscular   Manufacturer: Moderna   Lot: [id number]   [id number]

## 2019-07-14 ENCOUNTER — Ambulatory Visit: Payer: BC Managed Care – PPO | Attending: Internal Medicine

## 2019-07-14 DIAGNOSIS — Z23 Encounter for immunization: Secondary | ICD-10-CM

## 2019-07-14 NOTE — Progress Notes (Signed)
   Covid-19 Vaccination Clinic  Name:  Andrea Paul    MRN: [id number] DOB: 05-Feb-1956  07/14/2019  [name] was observed post Covid-19 immunization for 15 minutes without incident. She was provided with Vaccine Information Sheet and instruction to access the V-Safe system.   [name] was instructed to call 911 with any severe reactions post vaccine: Marland Kitchen Difficulty breathing  . Swelling of face and throat  . A fast heartbeat  . A bad rash all over body  . Dizziness and weakness   Immunizations Administered    Name Date Dose VIS Date Route   Moderna COVID-19 Vaccine 07/14/2019 11:04 AM 0.5 mL 02/2019 Intramuscular   Manufacturer: Moderna   Lot: [id number]   [id number]

## 2019-07-17 DIAGNOSIS — F5105 Insomnia due to other mental disorder: Secondary | ICD-10-CM | POA: Diagnosis not present

## 2019-07-17 DIAGNOSIS — F411 Generalized anxiety disorder: Secondary | ICD-10-CM | POA: Diagnosis not present

## 2019-07-17 DIAGNOSIS — F3181 Bipolar II disorder: Secondary | ICD-10-CM | POA: Diagnosis not present

## 2019-07-20 DIAGNOSIS — F411 Generalized anxiety disorder: Secondary | ICD-10-CM | POA: Diagnosis not present

## 2019-09-09 DIAGNOSIS — T63421A Toxic effect of venom of ants, accidental (unintentional), initial encounter: Secondary | ICD-10-CM | POA: Diagnosis not present

## 2019-10-01 DIAGNOSIS — J0100 Acute maxillary sinusitis, unspecified: Secondary | ICD-10-CM | POA: Diagnosis not present

## 2019-10-16 DIAGNOSIS — F5105 Insomnia due to other mental disorder: Secondary | ICD-10-CM | POA: Diagnosis not present

## 2019-10-16 DIAGNOSIS — F3181 Bipolar II disorder: Secondary | ICD-10-CM | POA: Diagnosis not present

## 2019-10-16 DIAGNOSIS — F411 Generalized anxiety disorder: Secondary | ICD-10-CM | POA: Diagnosis not present

## 2019-10-30 DIAGNOSIS — L821 Other seborrheic keratosis: Secondary | ICD-10-CM | POA: Diagnosis not present

## 2019-10-30 DIAGNOSIS — D225 Melanocytic nevi of trunk: Secondary | ICD-10-CM | POA: Diagnosis not present

## 2019-10-30 DIAGNOSIS — L814 Other melanin hyperpigmentation: Secondary | ICD-10-CM | POA: Diagnosis not present

## 2019-10-30 DIAGNOSIS — D1801 Hemangioma of skin and subcutaneous tissue: Secondary | ICD-10-CM | POA: Diagnosis not present

## 2019-12-08 DIAGNOSIS — Z23 Encounter for immunization: Secondary | ICD-10-CM | POA: Diagnosis not present

## 2019-12-15 ENCOUNTER — Telehealth: Payer: Self-pay

## 2019-12-15 ENCOUNTER — Ambulatory Visit (INDEPENDENT_AMBULATORY_CARE_PROVIDER_SITE_OTHER): Payer: BC Managed Care – PPO

## 2019-12-15 ENCOUNTER — Ambulatory Visit (INDEPENDENT_AMBULATORY_CARE_PROVIDER_SITE_OTHER): Payer: BC Managed Care – PPO | Admitting: Podiatry

## 2019-12-15 ENCOUNTER — Other Ambulatory Visit: Payer: Self-pay

## 2019-12-15 DIAGNOSIS — M2141 Flat foot [pes planus] (acquired), right foot: Secondary | ICD-10-CM

## 2019-12-15 DIAGNOSIS — M76821 Posterior tibial tendinitis, right leg: Secondary | ICD-10-CM

## 2019-12-15 DIAGNOSIS — B351 Tinea unguium: Secondary | ICD-10-CM

## 2019-12-15 DIAGNOSIS — M79671 Pain in right foot: Secondary | ICD-10-CM

## 2019-12-15 DIAGNOSIS — M2142 Flat foot [pes planus] (acquired), left foot: Secondary | ICD-10-CM

## 2019-12-15 NOTE — Progress Notes (Signed)
  Subjective:  Patient ID: Andrea Paul, female    DOB: 22-Oct-1955,  MRN: [id number]  Chief Complaint  Patient presents with  . Nail Problem    Right 1st toenail grew back same way after it was removed in 2019  . Foot Pain    Right dorsal/medial foot pain ache 3 month duration no known injuries.   64 y.o. female presents with the above complaint. History confirmed with patient.   Objective:  Physical Exam: warm, good capillary refill, no trophic changes or ulcerative lesions, normal DP and PT pulses and normal sensory exam. Right hallux nail dystrophy  Right Foot: POP right PTT. Pes planus with forefoot abduction on WB. Dorsal 1st TMT exostosis.   No images are attached to the encounter.  Radiographs: X-ray of the right foot: no fracture, dislocation, swelling or degenerative changes noted.  Assessment:   1. Posterior tibial tendonitis of right leg   2. Pain due to onychomycosis of nail   3. Pes planus of both feet    Plan:  Patient was evaluated and treated and all questions answered.  PTTD, pes planus -X-rays reviewed as above -Dispensed Tri-Lock ankle brace.  Patient educated on use  -Consider CMOs next visit.  Right hallux nail dystrophy -Debrided due to pain -Advised nail changes likely permanent.  Return in about 6 weeks (around 01/26/2020).

## 2019-12-15 NOTE — Telephone Encounter (Signed)
Pt called wanting to speak with Allena Katz regarding taking long walks of 2.5 miles. Please Advise.

## 2019-12-15 NOTE — Patient Instructions (Signed)
Recommend urea 40% cream. Available on [place].

## 2019-12-16 ENCOUNTER — Telehealth: Payer: Self-pay

## 2019-12-16 NOTE — Telephone Encounter (Signed)
Pt did not answer. Left voicemail stating walks of 2.38miles would be fine so long as the patient did not overexert herself and was aware of her comfort. Advised patient to wear proper shoe gear and to plan distance walking with the return walk also in mind, as to not end up in pain while still having to walk a long distance back.

## 2019-12-17 ENCOUNTER — Telehealth: Payer: Self-pay

## 2019-12-17 MED ORDER — MELOXICAM 15 MG PO TABS: 15.0000 mg | ORAL_TABLET | Freq: Every day | ORAL | 0 refills | Status: DC

## 2019-12-17 NOTE — Telephone Encounter (Signed)
Pt would like a prescription for an antiinflammatory, please advise.

## 2019-12-22 ENCOUNTER — Ambulatory Visit: Payer: BC Managed Care – PPO | Admitting: Podiatry

## 2020-01-08 ENCOUNTER — Other Ambulatory Visit: Payer: Self-pay | Admitting: Podiatry

## 2020-01-08 DIAGNOSIS — M76821 Posterior tibial tendinitis, right leg: Secondary | ICD-10-CM

## 2020-01-09 ENCOUNTER — Other Ambulatory Visit: Payer: Self-pay | Admitting: Podiatry

## 2020-01-15 DIAGNOSIS — F5105 Insomnia due to other mental disorder: Secondary | ICD-10-CM | POA: Diagnosis not present

## 2020-01-15 DIAGNOSIS — F3181 Bipolar II disorder: Secondary | ICD-10-CM | POA: Diagnosis not present

## 2020-01-15 DIAGNOSIS — F411 Generalized anxiety disorder: Secondary | ICD-10-CM | POA: Diagnosis not present

## 2020-01-26 ENCOUNTER — Other Ambulatory Visit: Payer: Self-pay

## 2020-01-26 ENCOUNTER — Ambulatory Visit: Payer: BC Managed Care – PPO | Admitting: Podiatry

## 2020-01-26 DIAGNOSIS — M2141 Flat foot [pes planus] (acquired), right foot: Secondary | ICD-10-CM

## 2020-01-26 DIAGNOSIS — M2142 Flat foot [pes planus] (acquired), left foot: Secondary | ICD-10-CM

## 2020-01-26 DIAGNOSIS — M76821 Posterior tibial tendinitis, right leg: Secondary | ICD-10-CM

## 2020-01-26 NOTE — Progress Notes (Signed)
  Subjective:  Patient ID: Andrea Paul, female    DOB: March 16, 1956,  MRN: [id number]  Chief Complaint  Patient presents with  . Tendonitis    Pt states pain greatly resolved  . Foot Orthotics    Pt interest in Custom orthotics/needs education on them   64 y.o. female presents with the above complaint. History confirmed with patient. Overall doing much better does have pain with a lot of activity without the brace on.  Objective:  Physical Exam: warm, good capillary refill, no trophic changes or ulcerative lesions, normal DP and PT pulses and normal sensory exam. Right hallux nail dystrophy  Right Foot: no POP right PTT. Pes planus with forefoot abduction on WB. Dorsal 1st TMT exostosis.   Assessment:   1. Posterior tibial tendonitis of right leg   2. Pes planus of both feet    Plan:  Patient was evaluated and treated and all questions answered.  PTTD, pes planus -Transition out of ASO brace. Only to use at home -Would benefit from [employer] orthotics. Will check benefits. Patient also encouraged to check benefits. If covered and patient would like to proceed will make appt for brace -Continue Meloxicam until patient gets brace.  Right hallux nail dystrophy -Recc urea, patient has not purchased yet.  Return if symptoms worsen or fail to improve, for after orthotic fabrication.

## 2020-02-09 ENCOUNTER — Other Ambulatory Visit: Payer: Self-pay | Admitting: Podiatry

## 2020-03-05 ENCOUNTER — Other Ambulatory Visit: Payer: Self-pay | Admitting: Podiatry

## 2020-03-06 NOTE — Telephone Encounter (Signed)
Please advise 

## 2020-03-25 DIAGNOSIS — Z20822 Contact with and (suspected) exposure to covid-19: Secondary | ICD-10-CM | POA: Diagnosis not present

## 2020-03-31 ENCOUNTER — Other Ambulatory Visit: Payer: Self-pay | Admitting: Podiatry

## 2020-04-01 NOTE — Telephone Encounter (Signed)
Please advise 

## 2020-04-15 DIAGNOSIS — D225 Melanocytic nevi of trunk: Secondary | ICD-10-CM | POA: Diagnosis not present

## 2020-04-15 DIAGNOSIS — L905 Scar conditions and fibrosis of skin: Secondary | ICD-10-CM | POA: Diagnosis not present

## 2020-04-15 DIAGNOSIS — L821 Other seborrheic keratosis: Secondary | ICD-10-CM | POA: Diagnosis not present

## 2020-04-15 DIAGNOSIS — Z872 Personal history of diseases of the skin and subcutaneous tissue: Secondary | ICD-10-CM | POA: Diagnosis not present

## 2020-05-12 DIAGNOSIS — Z5181 Encounter for therapeutic drug level monitoring: Secondary | ICD-10-CM | POA: Diagnosis not present

## 2020-05-12 DIAGNOSIS — R7303 Prediabetes: Secondary | ICD-10-CM | POA: Diagnosis not present

## 2020-05-12 DIAGNOSIS — E785 Hyperlipidemia, unspecified: Secondary | ICD-10-CM | POA: Diagnosis not present

## 2020-05-13 ENCOUNTER — Other Ambulatory Visit: Payer: Self-pay | Admitting: Podiatry

## 2020-05-13 DIAGNOSIS — F411 Generalized anxiety disorder: Secondary | ICD-10-CM | POA: Diagnosis not present

## 2020-05-13 DIAGNOSIS — F5105 Insomnia due to other mental disorder: Secondary | ICD-10-CM | POA: Diagnosis not present

## 2020-05-13 DIAGNOSIS — F3181 Bipolar II disorder: Secondary | ICD-10-CM | POA: Diagnosis not present

## 2020-05-18 DIAGNOSIS — Z0000 Encounter for general adult medical examination without abnormal findings: Secondary | ICD-10-CM | POA: Diagnosis not present

## 2020-05-18 DIAGNOSIS — R399 Unspecified symptoms and signs involving the genitourinary system: Secondary | ICD-10-CM | POA: Diagnosis not present

## 2020-06-03 ENCOUNTER — Ambulatory Visit: Payer: BC Managed Care – PPO

## 2020-06-14 ENCOUNTER — Ambulatory Visit (HOSPITAL_BASED_OUTPATIENT_CLINIC_OR_DEPARTMENT_OTHER): Payer: BC Managed Care – PPO | Admitting: Obstetrics & Gynecology

## 2020-06-23 DIAGNOSIS — J069 Acute upper respiratory infection, unspecified: Secondary | ICD-10-CM | POA: Diagnosis not present

## 2020-06-25 DIAGNOSIS — Z20822 Contact with and (suspected) exposure to covid-19: Secondary | ICD-10-CM | POA: Diagnosis not present

## 2020-06-25 DIAGNOSIS — Z03818 Encounter for observation for suspected exposure to other biological agents ruled out: Secondary | ICD-10-CM | POA: Diagnosis not present

## 2020-07-22 ENCOUNTER — Ambulatory Visit (HOSPITAL_BASED_OUTPATIENT_CLINIC_OR_DEPARTMENT_OTHER): Payer: BC Managed Care – PPO | Admitting: Obstetrics & Gynecology

## 2020-07-22 DIAGNOSIS — Z5181 Encounter for therapeutic drug level monitoring: Secondary | ICD-10-CM | POA: Diagnosis not present

## 2020-07-22 DIAGNOSIS — M542 Cervicalgia: Secondary | ICD-10-CM | POA: Diagnosis not present

## 2020-07-22 DIAGNOSIS — R5382 Chronic fatigue, unspecified: Secondary | ICD-10-CM | POA: Diagnosis not present

## 2020-09-06 DIAGNOSIS — F3181 Bipolar II disorder: Secondary | ICD-10-CM | POA: Diagnosis not present

## 2020-09-06 DIAGNOSIS — F411 Generalized anxiety disorder: Secondary | ICD-10-CM | POA: Diagnosis not present

## 2020-09-22 ENCOUNTER — Ambulatory Visit (HOSPITAL_BASED_OUTPATIENT_CLINIC_OR_DEPARTMENT_OTHER): Payer: BC Managed Care – PPO | Admitting: Obstetrics & Gynecology

## 2020-10-04 ENCOUNTER — Other Ambulatory Visit: Payer: Self-pay | Admitting: Family Medicine

## 2020-11-08 ENCOUNTER — Encounter: Payer: Self-pay | Admitting: Podiatry

## 2020-11-09 ENCOUNTER — Encounter: Payer: Self-pay | Admitting: Podiatry

## 2020-12-02 ENCOUNTER — Other Ambulatory Visit: Payer: Self-pay

## 2020-12-02 ENCOUNTER — Ambulatory Visit (INDEPENDENT_AMBULATORY_CARE_PROVIDER_SITE_OTHER): Payer: Medicare Other | Admitting: Podiatry

## 2020-12-02 DIAGNOSIS — M2141 Flat foot [pes planus] (acquired), right foot: Secondary | ICD-10-CM

## 2020-12-02 DIAGNOSIS — M2142 Flat foot [pes planus] (acquired), left foot: Secondary | ICD-10-CM

## 2020-12-02 DIAGNOSIS — B351 Tinea unguium: Secondary | ICD-10-CM

## 2020-12-02 DIAGNOSIS — M79609 Pain in unspecified limb: Secondary | ICD-10-CM

## 2020-12-02 DIAGNOSIS — M76821 Posterior tibial tendinitis, right leg: Secondary | ICD-10-CM | POA: Diagnosis not present

## 2020-12-02 NOTE — Progress Notes (Signed)
  Subjective:  Patient ID: Andrea Paul, female    DOB: 10-10-1955,  MRN: [id number]  Chief Complaint  Patient presents with   Foot Orthotics    Discuss orthotics, pt states pain in right plantar midfoot/medial foot.   65 y.o. female presents with the above complaint. History confirmed with patient. She has continued pain at the medial arch and navicular area. Objective:  Physical Exam: warm, good capillary refill, no trophic changes or ulcerative lesions, normal DP and PT pulses and normal sensory exam. Right hallux nail dystrophy  Right Foot: POP right PTT. Pes planus with forefoot abduction on WB. Dorsal 1st TMT exostosis. Prominent medial navicular bone.  Assessment:   1. Posterior tibial tendonitis of right leg   2. Pes planus of both feet   3. Pain due to onychomycosis of nail    Plan:  Patient was evaluated and treated and all questions answered.  PTTD, pes planus -I think she would greatly benefit from orthotics for continued management of her PTTD. She has chronic PTTD with enlarged navicular bone on XR which predisposes to tendon weakness. She has benefited from pre-fabricated ankle bracing but would benefit from orthotics for more long-term control. I do believe these to be medically necessary. -Casted for CMOs today. Advised of likely non-coverage - ABN reviewed and signed by patient.  No follow-ups on file.

## 2020-12-19 ENCOUNTER — Other Ambulatory Visit: Payer: Self-pay

## 2020-12-19 ENCOUNTER — Encounter (HOSPITAL_BASED_OUTPATIENT_CLINIC_OR_DEPARTMENT_OTHER): Payer: Self-pay | Admitting: Obstetrics & Gynecology

## 2020-12-19 ENCOUNTER — Ambulatory Visit (INDEPENDENT_AMBULATORY_CARE_PROVIDER_SITE_OTHER): Payer: Medicare Other | Admitting: Obstetrics & Gynecology

## 2020-12-19 ENCOUNTER — Other Ambulatory Visit (HOSPITAL_COMMUNITY)
Admission: RE | Admit: 2020-12-19 | Discharge: 2020-12-19 | Disposition: A | Discharge status: Home / Self Care | Payer: Medicare Other | Source: Ambulatory Visit | Attending: Obstetrics & Gynecology | Admitting: Obstetrics & Gynecology

## 2020-12-19 VITALS — BP 121/69 | HR 74 | Ht 70.5 in | Wt 170.0 lb

## 2020-12-19 DIAGNOSIS — Z01419 Encounter for gynecological examination (general) (routine) without abnormal findings: Secondary | ICD-10-CM | POA: Diagnosis present

## 2020-12-19 DIAGNOSIS — Z1151 Encounter for screening for human papillomavirus (HPV): Secondary | ICD-10-CM | POA: Insufficient documentation

## 2020-12-19 DIAGNOSIS — Z124 Encounter for screening for malignant neoplasm of cervix: Secondary | ICD-10-CM | POA: Diagnosis not present

## 2020-12-19 DIAGNOSIS — Z9189 Other specified personal risk factors, not elsewhere classified: Secondary | ICD-10-CM | POA: Diagnosis not present

## 2020-12-19 DIAGNOSIS — Z780 Asymptomatic menopausal state: Secondary | ICD-10-CM | POA: Diagnosis not present

## 2020-12-19 DIAGNOSIS — B977 Papillomavirus as the cause of diseases classified elsewhere: Secondary | ICD-10-CM

## 2020-12-19 DIAGNOSIS — N941 Unspecified dyspareunia: Secondary | ICD-10-CM

## 2020-12-19 DIAGNOSIS — Z13820 Encounter for screening for osteoporosis: Secondary | ICD-10-CM | POA: Diagnosis not present

## 2020-12-19 DIAGNOSIS — E2839 Other primary ovarian failure: Secondary | ICD-10-CM

## 2020-12-19 NOTE — Progress Notes (Signed)
65 y.o. G0P0000 Married White or Caucasian female here for breast and pelvic exam.  Having tenderness between labia.  Feels skin is very sensitive.  Denies vaginal bleeding or vaginal discharge.    Having some issues with tibial tendonitis.  Having trouble with exercising right now.    Reviewed care gaps.  Pt has not had hep C testing.  She will plan to do with PCP  Patient's last menstrual period was 10/18/2010.          H/O STD:  yes  Health Maintenance: PCP:  Dr Justin Mend.  Last wellness appt was earlier this year 2022.  Did blood work at that appt:  yes Vaccines are up to date:  has not had shingles or tdap updated to the best of her knowledge Colonoscopy:  2014, follow up 10 years MMG:  02/16/2019 Negative, did not do in 2021 BMD:  11/06/2013 Last pap smear:  03/07/2018 Negative.   H/o abnormal pap smear:  yes.  2017.    03/07/18 Neg:Neg HR HPV              12/31/16 Neg. HR HPV:neg              09/30/15 Neg. HR HPV:+detected. # 16, 18/45    reports that she has never smoked. She has never used smokeless tobacco. She reports that she does not drink alcohol and does not use drugs.  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    Heart murmur    History of blood transfusion    age 65 years old   History of periodontal disease    Pain in gums 01/16/2013   Pre-diabetes    Sleep apnea    no CPAP prescribed, sleeps on sides    Past Surgical History:  Procedure Laterality Date   ANTERIOR CERVICAL DECOMP/DISCECTOMY FUSION  09/17/2011   Procedure: ANTERIOR CERVICAL DECOMPRESSION/DISCECTOMY FUSION 2 LEVELS;  Surgeon: Hosie Spangle, MD;  Location: [facility];  Service: Neurosurgery;  Laterality: Bilateral;  Cervical five-six, six-seven anterior cervical decompression with fusion plating and  bonegraft   BASAL CELL CARCINOMA EXCISION  2010   COLONOSCOPY  2014   DILATATION & CURETTAGE/HYSTEROSCOPY WITH MYOSURE N/A 12/19/2015   Procedure: [place];  Surgeon: Megan Salon, MD;  Location: [facility];  Service: Gynecology;  Laterality: N/A;   ENDOMETRIAL BIOPSY     GANGLION CYST EXCISION     right wrist   TONSILLECTOMY      Current Outpatient Medications  Medication Sig Dispense Refill   Acetylcarnitine HCl (ACETYL L-CARNITINE) 500 MG CAPS Take 1 capsule by mouth daily.     ARIPiprazole (ABILIFY) 20 MG tablet Take 1 tablet by mouth daily.     azelastine (ASTELIN) 0.1 % nasal spray Place 1 spray into both nostrils 2 (two) times daily.     B Complex-Folic Acid (W-109 BALANCED TR PO) Take 1 tablet by mouth daily.     buPROPion (WELLBUTRIN XL) 300 MG 24 hr tablet Take by mouth.     ciprofloxacin (CILOXAN) 0.3 % ophthalmic solution      clonazePAM (KLONOPIN) 0.5 MG tablet Take by mouth.     Coenzyme Q10 (COQ10  PO) Take 1 capsule by mouth daily.     escitalopram (LEXAPRO) 20 MG tablet Take 20 mg by mouth daily.     ibuprofen (ADVIL,MOTRIN) 800 MG tablet Take 800 mg by mouth every 8 (eight) hours as needed. for pain  0   lamoTRIgine (LAMICTAL) 150 MG tablet Take 2 tablets (300 mg total) by mouth daily. 60 tablet 0   Multiple Vitamin (MULTIVITAMIN WITH MINERALS) TABS Take 1 tablet by mouth daily.     Omega-3 Fatty Acids (FISH OIL PO) Take 1 capsule by mouth daily.      Probiotic Product (PROBIOTIC DAILY PO) Take 1 tablet by mouth daily.     simvastatin (ZOCOR) 20 MG tablet Take 20 mg by mouth every evening.     VITAMIN C, CALCIUM ASCORBATE, PO Take 1 tablet by mouth daily.     VITAMIN D, CHOLECALCIFEROL, PO Take 1 tablet by mouth daily.      lidocaine (XYLOCAINE) 2 % solution  (Patient not taking: Reported on 12/19/2020)     No current facility-administered medications for this visit.    Family History  Problem Relation Age of Onset   Dementia  Mother 55   Rheum arthritis Mother    Alcohol abuse Father    Anxiety disorder Father    Depression Father    Dementia Father 61   Paranoid behavior Father    CAD Father    Depression Sister    Alcohol abuse Brother    Bipolar disorder Brother    Drug abuse Brother    Sexual abuse Paternal Grandfather    Depression Brother    Depression Sister    OCD Neg Hx    Schizophrenia Neg Hx    Thyroid disease Neg Hx     Review of Systems  All other systems reviewed and are negative.  Exam:   BP 121/69 (BP Location: Right Arm, Patient Position: Sitting, Cuff Size: Normal)   Pulse 74   Ht 5' 10.5" (1.791 m)   Wt 170 lb (77.1 kg)   LMP 10/18/2010   BMI 24.05 kg/m   Height: 5' 10.5" (179.1 cm)  General appearance: alert, cooperative and appears stated age Breasts: normal appearance, no masses or tenderness Abdomen: soft, non-tender; bowel sounds normal; no masses,  no organomegaly Lymph nodes: Cervical, supraclavicular, and axillary nodes normal.  No abnormal inguinal nodes palpated Neurologic: Grossly normal  Pelvic: External genitalia:  no lesions              Urethra:  normal appearing urethra with no masses, tenderness or lesions              Bartholins and Skenes: normal                 Vagina: normal appearing vagina with atrophic changes and no discharge, no lesions              Cervix: no lesions              Pap taken: Yes.   Bimanual Exam:  Uterus:  normal size, contour, position, consistency, mobility, non-tender              Adnexa: normal adnexa and no mass, fullness, tenderness               Rectovaginal: Confirms               Anus:  normal sphincter tone, no lesions  Chaperone, Octaviano Batty, CMA, was present for exam.  1. GYN exam  for high-risk Medicare patient - pap obtained today - MMG overdue.  Pt aware.  Last done 01/2019 - colonoscopy 2014.  Follow up 10 years. - Plan BMD this year - care gaps reviewed/updated  2. Osteoporosis screening - DG BONE  DENSITY (DXA); Future  3.  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 Dyspareunia in female

## 2020-12-21 LAB — CYTOLOGY - PAP
Comment: NEGATIVE
Diagnosis: NEGATIVE
High risk HPV: NEGATIVE

## 2021-01-02 ENCOUNTER — Encounter: Payer: Self-pay | Admitting: Podiatry

## 2021-01-02 ENCOUNTER — Encounter (HOSPITAL_BASED_OUTPATIENT_CLINIC_OR_DEPARTMENT_OTHER): Payer: Self-pay

## 2021-01-10 ENCOUNTER — Ambulatory Visit (INDEPENDENT_AMBULATORY_CARE_PROVIDER_SITE_OTHER): Payer: Medicare Other

## 2021-01-10 ENCOUNTER — Other Ambulatory Visit: Payer: Self-pay

## 2021-01-10 DIAGNOSIS — M2141 Flat foot [pes planus] (acquired), right foot: Secondary | ICD-10-CM

## 2021-01-10 DIAGNOSIS — M2142 Flat foot [pes planus] (acquired), left foot: Secondary | ICD-10-CM

## 2021-01-10 NOTE — Progress Notes (Signed)
Patient in office to pick-up custom orthotics. Patient tried orthotics on in office and is satisfied with the fit and feel of the custom orthotics. Educated the patient on the break-in process at this time. Patient verbalized understanding. Advise patient to call with any questions, comments or concerns.

## 2021-02-13 ENCOUNTER — Telehealth (HOSPITAL_BASED_OUTPATIENT_CLINIC_OR_DEPARTMENT_OTHER): Payer: Self-pay | Admitting: Nurse Practitioner

## 2021-02-13 ENCOUNTER — Encounter (HOSPITAL_BASED_OUTPATIENT_CLINIC_OR_DEPARTMENT_OTHER): Payer: Self-pay | Admitting: Nurse Practitioner

## 2021-02-13 ENCOUNTER — Ambulatory Visit (INDEPENDENT_AMBULATORY_CARE_PROVIDER_SITE_OTHER): Payer: Medicare Other | Admitting: Nurse Practitioner

## 2021-02-13 ENCOUNTER — Other Ambulatory Visit: Payer: Self-pay

## 2021-02-13 VITALS — BP 122/82 | HR 79 | Ht 70.0 in | Wt 169.8 lb

## 2021-02-13 DIAGNOSIS — R0982 Postnasal drip: Secondary | ICD-10-CM | POA: Insufficient documentation

## 2021-02-13 DIAGNOSIS — H6123 Impacted cerumen, bilateral: Secondary | ICD-10-CM

## 2021-02-13 DIAGNOSIS — F3181 Bipolar II disorder: Secondary | ICD-10-CM

## 2021-02-13 DIAGNOSIS — H9193 Unspecified hearing loss, bilateral: Secondary | ICD-10-CM | POA: Diagnosis not present

## 2021-02-13 DIAGNOSIS — Z0000 Encounter for general adult medical examination without abnormal findings: Secondary | ICD-10-CM | POA: Insufficient documentation

## 2021-02-13 DIAGNOSIS — E559 Vitamin D deficiency, unspecified: Secondary | ICD-10-CM

## 2021-02-13 DIAGNOSIS — R413 Other amnesia: Secondary | ICD-10-CM | POA: Insufficient documentation

## 2021-02-13 HISTORY — DX: Encounter for general adult medical examination without abnormal findings: Z00.00

## 2021-02-13 HISTORY — DX: Postnasal drip: R09.82

## 2021-02-13 HISTORY — DX: Impacted cerumen, bilateral: H61.23

## 2021-02-13 MED ORDER — MONTELUKAST SODIUM 10 MG PO TABS: 10.0000 mg | ORAL_TABLET | Freq: Every day | ORAL | 3 refills | Status: DC

## 2021-02-13 NOTE — Telephone Encounter (Signed)
Pt called to say that her medications that were sent in for her from the visit today was suppose to be sent to   [facility] [id number] - [place], [place]  Pt would like these resent to correct pharmacy. Please advise.

## 2021-02-13 NOTE — Assessment & Plan Note (Signed)
Chronic PND with clearing of throat She has tried numerous OTC and Rx medications for treatment with benefit, however, the drying effect is more than she can tolerate.  Will trial montelukast to see if this helps with her symptom management No signs of GERD present today If unable to tolerate montelukast full dose, may consider 1/2 dose to see if she has effectiveness without drying

## 2021-02-13 NOTE — Assessment & Plan Note (Signed)
Review of current and past medical history, social history, medication, and family history.  Review of care gaps and health maintenance recommendations.  Records from recent providers to be requested if not available in Chart Review or Care Everywhere.  Recommendations for health maintenance, diet, and exercise provided.  Labs today: Vitamin B12, D, Thyroid HM Recommendations: Will review chart and make recommendations- eligible for Pna 20 CPE due: AWV TBD

## 2021-02-13 NOTE — Progress Notes (Signed)
Orma Render, DNP, AGNP-c Primary Care & Sports Medicine [address]  [place], [place] [address] [phone]  New patient visit   Patient: Andrea Paul   DOB: 12-12-1955   65 y.o. Female  MRN: [id number] Visit Date: 02/13/2021  Patient Care Team: Walda Hertzog, Coralee Pesa, NP as PCP - General (Nurse Practitioner)  Today's healthcare provider: Orma Render, NP   Chief Complaint  Patient presents with   Establish Care    Patient is here to establish care. There are a few concerns. She is bipolar and see's Dr Atha Starks he recommended a referral from PCP to neuro. Or Lab work for Vit. D and B12 check for memory. No refills needed today. She had her flu shot 10/22. She has also noticed that her hearing is getting worse when watching TV   Subjective    Andrea Paul is a 65 y.o. female who presents today as a new patient to establish care.  HPI HPI     Establish Care    Additional comments: Patient is here to establish care. There are a few concerns. She is bipolar and see's Dr Atha Starks he recommended a referral from PCP to neuro. Or Lab work for Vit. D and B12 check for memory. No refills needed today. She had her flu shot 10/22. She has also noticed that her hearing is getting worse when watching TV      Last edited by Pennelope Bracken on 02/13/2021  1:40 PM.      [name] has concerns today with the following: hearing loss, memory changes, chronic clearing of throat, aching in neck, and knee cracking.   Hearing Loss Endorses the following: Gradual progressive loss of low frequency sounds over the past several months Finds herself turning up the television and cupping ear to hear Reports sounds are muffled, especially lower tones Does not wear earbuds or use q-tips to clean ears Does not feel there is much wax from her ears No ringing or fullness  Memory Changes Endorses the following: History of dementia in both mother and father Onset in both  parents in late 10's, Delmi Fulfer 41's "I can't think of the word I am trying to think of" "I say, 'I don't remember' a lot" "I am constantly searching for words" Is told she repeats herself No difficulty remembering steps to common activities No time/place confusion No difficulty completing ADL Hx of Bipolar with treatment (Dr. Jessy Oto)  Chronic Clearing of Throat Endorses the following: Chronic post nasal drip Symptoms worse in the afternoon- not present in AM or late evening Feels voice gets raspy and she needs to clear then ok Has tried multiple allergy medications in form of nasal spray and oral medications. These help but make her too dry No sore throat, fever, chills, epigastric pain, GERD sx  Aching in Neck Endorses the following: Chronic "for years" Occurs on left side of neck medially and more anteriorly appx 2' distally to the ear lobe "Just about every day" at various times during the day No specific activity or movement associated with exacerbation Not severe in nature Goes away without intervention  Knee Cracking Endorses the following: Knees crack when standing up and sitting down No pain, weakness, giving away, falls.  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    Heart murmur    History of blood transfusion    age 54 years old   History of periodontal disease    Pain in gums 01/16/2013   Pre-diabetes    Sleep apnea    no CPAP prescribed, sleeps on sides   Past Surgical History:  Procedure Laterality Date   ANTERIOR CERVICAL DECOMP/DISCECTOMY FUSION  09/17/2011   Procedure: ANTERIOR CERVICAL DECOMPRESSION/DISCECTOMY FUSION 2 LEVELS;  Surgeon: Hosie Spangle, MD;  Location: [facility];  Service: Neurosurgery;  Laterality: Bilateral;   Cervical five-six, six-seven anterior cervical decompression with fusion plating and bonegraft   BASAL CELL CARCINOMA EXCISION  2010   COLONOSCOPY  2014   DILATATION & CURETTAGE/HYSTEROSCOPY WITH MYOSURE N/A 12/19/2015   Procedure: [place];  Surgeon: Megan Salon, MD;  Location: [facility];  Service: Gynecology;  Laterality: N/A;   GANGLION CYST EXCISION     right wrist   TONSILLECTOMY     Family Status  Relation Name Status   Mother  (Not Specified)   Father  (Not Specified)   Sister  (Not Specified)   Brother  (Not Specified)   PGF  (Not Specified)   Brother  (Not Specified)   Sister  (Not Specified)   Neg Hx  (Not Specified)   Family History  Problem Relation Age of Onset   Dementia Mother 17   Rheum arthritis Mother    Alcohol abuse Father    Anxiety disorder Father    Depression Father    Dementia Father 79   Paranoid behavior Father    CAD Father    Depression Sister    Alcohol abuse Brother    Bipolar disorder Brother    Drug abuse Brother    Sexual abuse Paternal Grandfather    Depression Brother    Depression Sister    OCD Neg Hx    Schizophrenia Neg Hx    Thyroid disease Neg Hx    Social History   Socioeconomic History   Marital status: Married    Spouse name: Not on file   Number of children: Not on file   Years of education: Not on file   Highest education level: Not on file  Occupational History   Not on file  Tobacco Use   Smoking status: Never   Smokeless tobacco: Never  Vaping Use   Vaping Use: Never used  Substance and Sexual Activity   Alcohol use: No   Drug use: No    Comment: Caffeine: 2 cups coffee   Sexual activity: Yes    Partners: Male    Birth control/protection: Post-menopausal  Other Topics Concern   Not on file  Social History Narrative   Not on file   Social Determinants of Health   Financial Resource Strain: Not on file  Food Insecurity: Not on file  Transportation Needs: Not on  file  Physical Activity: Not on file  Stress: Not on file  Social Connections: Not on file   Outpatient Medications Prior to Visit  Medication Sig   Acetylcarnitine HCl (ACETYL L-CARNITINE) 500 MG CAPS Take 1 capsule by mouth daily.   ARIPiprazole (ABILIFY) 20 MG tablet Take 1 tablet by mouth daily.   B Complex-Folic Acid (W-546 BALANCED TR PO) Take 1 tablet by mouth daily.   buPROPion (WELLBUTRIN XL) 300 MG 24 hr  tablet Take by mouth.   clonazePAM (KLONOPIN) 0.5 MG tablet Take by mouth.   Coenzyme Q10 (COQ10 PO) Take 1 capsule by mouth daily.   escitalopram (LEXAPRO) 20 MG tablet Take 20 mg by mouth daily.   lamoTRIgine (LAMICTAL) 150 MG tablet Take 2 tablets (300 mg total) by mouth daily.   Multiple Vitamin (MULTIVITAMIN WITH MINERALS) TABS Take 1 tablet by mouth daily.   Omega-3 Fatty Acids (FISH OIL PO) Take 1 capsule by mouth daily.    Probiotic Product (PROBIOTIC DAILY PO) Take 1 tablet by mouth daily.   simvastatin (ZOCOR) 20 MG tablet Take 20 mg by mouth every evening.   VITAMIN C, CALCIUM ASCORBATE, PO Take 1 tablet by mouth daily.   VITAMIN D, CHOLECALCIFEROL, PO Take 1 tablet by mouth daily.    [DISCONTINUED] azelastine (ASTELIN) 0.1 % nasal spray Place 1 spray into both nostrils 2 (two) times daily.   [DISCONTINUED] ciprofloxacin (CILOXAN) 0.3 % ophthalmic solution    [DISCONTINUED] ibuprofen (ADVIL,MOTRIN) 800 MG tablet Take 800 mg by mouth every 8 (eight) hours as needed. for pain   [DISCONTINUED] lidocaine (XYLOCAINE) 2 % solution  (Patient not taking: Reported on 12/19/2020)   No facility-administered medications prior to visit.   Allergies  Allergen Reactions   Latex Itching    Immunization History  Administered Date(s) Administered   DTaP 04/05/2008   Fluad Quad(high Dose 65+) 12/14/2020   Moderna SARS-COV2 Booster Vaccination 02/10/2020, 08/26/2020   Moderna Sars-Covid-2 Vaccination 06/12/2019, 07/14/2019   Zoster, Live 08/28/2013    Health Maintenance   Topic Date Due   Pneumonia Vaccine 53+ Years old (1 - PCV) Never done   COVID-19 Vaccine (3 - Booster for Moderna series) 10/21/2020   Zoster Vaccines- Shingrix (1 of 2) 03/21/2021 (Originally 09/22/2005)   TETANUS/TDAP  12/19/2021 (Originally 09/23/1974)   MAMMOGRAM  02/15/2021   COLONOSCOPY (Pts 45-44yrs Insurance coverage will need to be confirmed)  01/10/2023   PAP SMEAR-Modifier  12/20/2023   INFLUENZA VACCINE  Completed   DEXA SCAN  Completed   HPV VACCINES  Aged Out   Hepatitis C Screening  Discontinued   HIV Screening  Discontinued    Patient Care Team: Artemio Dobie, Coralee Pesa, NP as PCP - General (Nurse Practitioner)  Review of Systems All review of systems negative except what is listed in the HPI    Objective    BP 122/82   Pulse 79   Ht 5\' 10"  (1.778 m)   Wt 169 lb 12.8 oz (77 kg)   LMP 10/18/2010   SpO2 99%   BMI 24.36 kg/m  Physical Exam Vitals and nursing note reviewed.  Constitutional:      General: She is not in acute distress.    Appearance: Normal appearance.  HENT:     Right Ear: Decreased hearing noted. There is impacted cerumen.     Left Ear: Decreased hearing noted. There is impacted cerumen.     Nose: Congestion and rhinorrhea present.     Mouth/Throat:     Mouth: Mucous membranes are moist.     Pharynx: Posterior oropharyngeal erythema present.     Comments: Cobblestoning to posterior oropharynx Eyes:     Extraocular Movements: Extraocular movements intact.     Conjunctiva/sclera: Conjunctivae normal.     Pupils: Pupils are equal, round, and reactive to light.  Neck:     Thyroid: No thyromegaly.     Vascular: No carotid bruit.   Cardiovascular:     Rate and Rhythm: Normal rate and regular  rhythm.     Pulses: Normal pulses.     Heart sounds: Normal heart sounds. No murmur heard. Pulmonary:     Effort: Pulmonary effort is normal.     Breath sounds: Normal breath sounds. No wheezing.  Abdominal:     General: Bowel sounds are normal.      Palpations: Abdomen is soft.  Musculoskeletal:        General: Normal range of motion.     Cervical back: Normal range of motion and neck supple. No muscular tenderness. Normal range of motion.     Right lower leg: No edema.     Left lower leg: No edema.  Lymphadenopathy:     Cervical: No cervical adenopathy.     Right cervical: No superficial, deep or posterior cervical adenopathy.    Left cervical: No superficial, deep or posterior cervical adenopathy.  Skin:    General: Skin is warm and dry.     Capillary Refill: Capillary refill takes less than 2 seconds.  Neurological:     General: No focal deficit present.     Mental Status: She is alert and oriented to person, place, and time.  Psychiatric:        Mood and Affect: Mood normal.        Behavior: Behavior normal.        Thought Content: Thought content normal.        Judgment: Judgment normal.     Depression Screen PHQ 2/9 Scores 12/19/2020  PHQ - 2 Score 0   No results found for any visits on 02/13/21.  Assessment & Plan      Problem List Items Addressed This Visit     Bipolar 2 disorder [facility])    Managed by Psychiatry.  No concerning symptoms present today.  Discussed memory concerns in relation to anxiety and depression symptoms.  At this time, her symptoms appear to be well controlled, therefore I feel further assessment is warranted.       Memory loss or impairment - Primary    Reported memory concerns with difficulty finding words.  Hx of dementia in both parents with diagnosis in late 70's, Klynn Linnemann 80's No signs of difficulty performing daily activities and familiar tasks, no disorientation to time or place. Although symptoms are subtle, with strong family history I do feel further assessment and monitoring is warranted. In the event of dementia presence, Debbera Wolken diagnosis may help prolong quality of life.  Will check TSH, B12, and Vitamin D today for further evaluation and r/o endocrine/vitamin deficiency causing  some symptoms.       Relevant Orders   Ambulatory referral to Psychology   VITAMIN D 25 Hydroxy (Vit-D Deficiency, Fractures)   Thyroid Panel With TSH   B12 and Folate Panel   Bilateral hearing loss    Bilateral cerumen impaction present on assessment Recommend irrigation and cerumen removal then assess hearing Discussed with patient that component of hearing loss may be present, but this will give better assessment.  She will schedule this in the near future.       Post-nasal drip    Chronic PND with clearing of throat She has tried numerous OTC and Rx medications for treatment with benefit, however, the drying effect is more than she can tolerate.  Will trial montelukast to see if this helps with her symptom management No signs of GERD present today If unable to tolerate montelukast full dose, may consider 1/2 dose to see if she has effectiveness without drying  Relevant Orders   B12 and Folate Panel   Encounter for medical examination to establish care    Review of current and past medical history, social history, medication, and family history.  Review of care gaps and health maintenance recommendations.  Records from recent providers to be requested if not available in Chart Review or Care Everywhere.  Recommendations for health maintenance, diet, and exercise provided.  Labs today: Vitamin B12, D, Thyroid HM Recommendations: Will review chart and make recommendations- eligible for Pna 20 CPE due: AWV TBD       Other Visit Diagnoses     Vitamin D deficiency, unspecified       Relevant Orders   VITAMIN D 25 Hydroxy (Vit-D Deficiency, Fractures)        Return for Near future for ear irrigation (69min proc visit).     Ramzy Cappelletti, Coralee Pesa, NP, DNP, AGNP-C Primary [facility] at [place]

## 2021-02-13 NOTE — Telephone Encounter (Signed)
Prescription sent to correct pharmacy.

## 2021-02-13 NOTE — Assessment & Plan Note (Signed)
Bilateral cerumen impaction present on assessment Recommend irrigation and cerumen removal then assess hearing Discussed with patient that component of hearing loss may be present, but this will give better assessment.  She will schedule this in the near future.

## 2021-02-13 NOTE — Patient Instructions (Signed)
Thank you for choosing [facility] at [facility] for your Primary Care needs. I am excited for the opportunity to partner with you to meet your health care goals. It was a pleasure meeting you today!  Recommendations from today's visit: I have sent the referral for neuropsychology We will get labs today to monitor for vitamin deficiency that could be contributing to memory loss We will plan to have you come back to have your ears cleaned. Debrox is a medication that you can purchase over the counter to help clear the ears, you may try this at home prior to your appointment if you would like.  I have sent in Singulair (montelukast) for the cough, this should hep without being too drying.   Information on diet, exercise, and health maintenance recommendations are listed below. This is information to help you be sure you are on track for optimal health and monitoring.   Please look over this and let us know if you have any questions or if you have completed any of the health maintenance outside of [facility] so that we can be sure your records are up to date.  ___________________________________________________________ About Me: I am an Adult-Geriatric Nurse Practitioner with a background in caring for patients for more than 20 years with a strong intensive care background. I provide primary care and sports medicine services to patients age 64 and older within this office. My education had a strong focus on caring for the older adult population, which I am passionate about. I am also the director of the APP Fellowship with [facility].   My desire is to provide you with the best service through preventive medicine and supportive care. I consider you a part of the medical team and value your input. I work diligently to ensure that you are heard and your needs are met in a safe and effective manner. I want you to feel comfortable with me as your provider and want you to know that  your health concerns are important to me.  For your information, our office hours are: Monday, Tuesday, and Thursday 8:00 AM - 5:00 PM Wednesday and Friday 8:00 AM - 12:00 PM.   In my time away from the office I am teaching new APP's within the system and am unavailable, but my partner, Dr. Burnard Bunting is in the office for emergent needs.   If you have questions or concerns, please call our office at [phone] or send [place] a MyChart message and we will respond as quickly as possible.  ____________________________________________________________ MyChart:  For all urgent or time sensitive needs we ask that you please call the office to avoid delays. Our number is [phone]. MyChart is not constantly monitored and due to the large volume of messages a day, replies may take up to 72 business hours.  MyChart Policy: MyChart allows for you to see your visit notes, after visit summary, provider recommendations, lab and tests results, make an appointment, request refills, and contact your provider or the office for non-urgent questions or concerns. Providers are seeing patients during normal business hours and do not have built in time to review MyChart messages.  We ask that you allow a minimum of 3 business days for responses to [employer]. For this reason, please do not send urgent requests through [place]. Please call the office at [phone]. New and ongoing conditions may require a visit. We have virtual and in person visit available for your convenience.  Complex MyChart concerns may require  a visit. Your provider may request you schedule a virtual or in person visit to ensure we are providing the best care possible. MyChart messages sent after 11:00 AM on Friday will not be received by the provider until Monday morning.    Lab and Test Results: You will receive your lab and test results on MyChart as soon as they are completed and results have been sent by the lab or testing facility.  Due to this service, you will receive your results BEFORE your provider.  I review lab and tests results each morning prior to seeing patients. Some results require collaboration with other providers to ensure you are receiving the most appropriate care. For this reason, we ask that you please allow a minimum of 3-5 business days from the time the ALL results have been received for your provider to receive and review lab and test results and contact you about these.  Most lab and test result comments from the provider will be sent through [place]. Your provider may recommend changes to the plan of care, follow-up visits, repeat testing, ask questions, or request an office visit to discuss these results. You may reply directly to this message or call the office at [phone] to provide information for the provider or set up an appointment. In some instances, you will be called with test results and recommendations. Please let us know if this is preferred and we will make note of this in your chart to provide this for you.    If you have not heard a response to your lab or test results in 5 business days from all results returning to [place], please call the office to let us know. We ask that you please avoid calling prior to this time unless there is an emergent concern. Due to high call volumes, this can delay the resulting process.  After Hours: For all non-emergency after hours needs, please call the office at [phone] and select the option to reach the on-call provider service. On-call services are shared between multiple [facility] offices and therefore it will not be possible to speak directly with your provider. On-call providers may provide medical advice and recommendations, but are unable to provide refills for maintenance medications.  For all emergency or urgent medical needs after normal business hours, we recommend that you seek care at the closest Urgent Care or Emergency Department to  ensure appropriate treatment in a timely manner.  [facility] [place] has a 24 hour emergency room located on the ground floor for your convenience.   Urgent Concerns During the Business Day Providers are seeing patients from 8AM to [place] with a busy schedule and are most often not able to respond to non-urgent calls until the end of the day or the next business day. If you should have URGENT concerns during the day, please call and speak to the nurse or schedule a same day appointment so that we can address your concern without delay.   Thank you, again, for choosing me as your health care partner. I appreciate your trust and look forward to learning more about you.   Worthy Keeler, DNP, AGNP-c ___________________________________________________________  Health Maintenance Recommendations Screening Testing Mammogram Every 1 -2 years based on history and risk factors Starting at age 38 Pap Smear Ages 21-39 every 3 years Ages 66-65 every 5 years with HPV testing More frequent testing may be required based on results and history Colon Cancer Screening Every 1-10 years based on test performed, risk factors,  and history Starting at age 30 Bone Density Screening Every 2-10 years based on history Starting at age 71 for women Recommendations for men differ based on medication usage, history, and risk factors AAA Screening One time ultrasound Men 3-4 years old who have every smoked Lung Cancer Screening Low Dose Lung CT every 12 months Age 32-80 years with a 30 pack-year smoking history who still smoke or who have quit within the last 15 years  Screening Labs Routine  Labs: Complete Blood Count (CBC), Complete Metabolic Panel (CMP), Cholesterol (Lipid Panel) Every 6-12 months based on history and medications May be recommended more frequently based on current conditions or previous results Hemoglobin A1c Lab Every 3-12 months based on history and previous  results Starting at age 76 or earlier with diagnosis of diabetes, high cholesterol, BMI >26, and/or risk factors Frequent monitoring for patients with diabetes to ensure blood sugar control Thyroid Panel (TSH w/ T3 & T4) Every 6 months based on history, symptoms, and risk factors May be repeated more often if on medication HIV One time testing for all patients 7 and older May be repeated more frequently for patients with increased risk factors or exposure Hepatitis C One time testing for all patients 12 and older May be repeated more frequently for patients with increased risk factors or exposure Gonorrhea, Chlamydia Every 12 months for all sexually active persons 13-24 years Additional monitoring may be recommended for those who are considered high risk or who have symptoms PSA Men 70-4 years old with risk factors Additional screening may be recommended from age 54-69 based on risk factors, symptoms, and history  Vaccine Recommendations Tetanus Booster All adults every 10 years Flu Vaccine All patients 6 months and older every year COVID Vaccine All patients 12 years and older Initial dosing with booster May recommend additional booster based on age and health history HPV Vaccine 2 doses all patients age 58-26 Dosing may be considered for patients over 26 Shingles Vaccine (Shingrix) 2 doses all adults 56 years and older Pneumonia (Pneumovax 23) All adults 2 years and older May recommend earlier dosing based on health history Pneumonia (Prevnar 38) All adults 59 years and older Dosed 1 year after Pneumovax 23  Additional Screening, Testing, and Vaccinations may be recommended on an individualized basis based on family history, health history, risk factors, and/or exposure.  __________________________________________________________  Diet Recommendations for All Patients  I recommend that all patients maintain a diet low in saturated fats, carbohydrates, and cholesterol.  While this can be challenging at first, it is not impossible and small changes can make big differences.  Things to try: Decreasing the amount of soda, sweet tea, and/or juice to one or less per day and replace with water While water is always the first choice, if you do not like water you may consider adding a water additive without sugar to improve the taste other sugar free drinks Replace potatoes with a brightly colored vegetable at dinner Use healthy oils, such as canola oil or olive oil, instead of butter or hard margarine Limit your bread intake to two pieces or less a day Replace regular pasta with low carb pasta options Bake, broil, or grill foods instead of frying Monitor portion sizes  Eat smaller, more frequent meals throughout the day instead of large meals  An important thing to remember is, if you love foods that are not great for your health, you don't have to give them up completely. Instead, allow these foods to be a reward  when you have done well. Allowing yourself to still have special treats every once in a while is a nice way to tell yourself thank you for working hard to keep yourself healthy.   Also remember that every day is a new day. If you have a bad day and "fall off the wagon", you can still climb right back up and keep moving along on your journey!  We have resources available to help you!  Some websites that may be helpful include: www.http://carter.biz/  Www.VeryWellFit.com _____________________________________________________________  Activity Recommendations for All Patients  I recommend that all adults get at least 20 minutes of moderate physical activity that elevates your heart rate at least 5 days out of the week.  Some examples include: Walking or jogging at a pace that allows you to carry on a conversation Cycling (stationary bike or outdoors) Water aerobics Yoga Weight lifting Dancing If physical limitations prevent you from putting stress on your  joints, exercise in a pool or seated in a chair are excellent options.  Do determine your MAXIMUM heart rate for activity: YOUR AGE - 220 = MAX HeartRate   Remember! Do not push yourself too hard.  Start slowly and build up your pace, speed, weight, time in exercise, etc.  Allow your body to rest between exercise and get good sleep. You will need more water than normal when you are exerting yourself. Do not wait until you are thirsty to drink. Drink with a purpose of getting in at least 8, 8 ounce glasses of water a day plus more depending on how much you exercise and sweat.    If you begin to develop dizziness, chest pain, abdominal pain, jaw pain, shortness of breath, headache, vision changes, lightheadedness, or other concerning symptoms, stop the activity and allow your body to rest. If your symptoms are severe, seek emergency evaluation immediately. If your symptoms are concerning, but not severe, please let us know so that we can recommend further evaluation.

## 2021-02-13 NOTE — Assessment & Plan Note (Signed)
Managed by Psychiatry.  No concerning symptoms present today.  Discussed memory concerns in relation to anxiety and depression symptoms.  At this time, her symptoms appear to be well controlled, therefore I feel further assessment is warranted.

## 2021-02-13 NOTE — Assessment & Plan Note (Signed)
Reported memory concerns with difficulty finding words.  Hx of dementia in both parents with diagnosis in late 70's, Andrea Paul 80's No signs of difficulty performing daily activities and familiar tasks, no disorientation to time or place. Although symptoms are subtle, with strong family history I do feel further assessment and monitoring is warranted. In the event of dementia presence, Andrea Paul diagnosis may help prolong quality of life.  Will check TSH, B12, and Vitamin D today for further evaluation and r/o endocrine/vitamin deficiency causing some symptoms.

## 2021-02-14 ENCOUNTER — Other Ambulatory Visit (HOSPITAL_BASED_OUTPATIENT_CLINIC_OR_DEPARTMENT_OTHER): Payer: Self-pay | Admitting: Nurse Practitioner

## 2021-02-14 DIAGNOSIS — R413 Other amnesia: Secondary | ICD-10-CM

## 2021-02-14 LAB — THYROID PANEL WITH TSH
Free Thyroxine Index: 1.9 (ref 1.2–4.9)
T3 Uptake Ratio: 24 % (ref 24–39)
T4, Total: 8 ug/dL (ref 4.5–12.0)
TSH: 1 u[IU]/mL (ref 0.450–4.500)

## 2021-02-14 LAB — B12 AND FOLATE PANEL
Folate: >20 ng/mL (ref >3.0)
Vitamin B-12: 652 pg/mL (ref 232–1245)

## 2021-02-14 LAB — VITAMIN D 25 HYDROXY (VIT D DEFICIENCY, FRACTURES): Vit D, 25-Hydroxy: 34.8 ng/mL (ref 30.0–100.0)

## 2021-02-20 ENCOUNTER — Encounter: Payer: Self-pay | Admitting: Physician Assistant

## 2021-02-20 ENCOUNTER — Ambulatory Visit (INDEPENDENT_AMBULATORY_CARE_PROVIDER_SITE_OTHER): Payer: Medicare Other | Admitting: Physician Assistant

## 2021-02-20 ENCOUNTER — Other Ambulatory Visit: Payer: Self-pay | Admitting: Neurology

## 2021-02-20 ENCOUNTER — Other Ambulatory Visit: Payer: Self-pay

## 2021-02-20 VITALS — BP 112/55 | HR 81 | Resp 20 | Ht 70.0 in | Wt 171.0 lb

## 2021-02-20 DIAGNOSIS — R413 Other amnesia: Secondary | ICD-10-CM

## 2021-02-20 NOTE — Patient Instructions (Addendum)
It was a pleasure to see you today at our office.   Recommendations:  MRI of the brain, the radiology office will call you to arrange you appointment  Follow up pending on the MRI results or if symptoms worsen   RECOMMENDATIONS FOR ALL PATIENTS WITH MEMORY PROBLEMS: 1. Continue to exercise (Recommend 30 minutes of walking everyday, or 3 hours every week) 2. Increase social interactions - continue going to [place] and enjoy social gatherings with friends and family 3. Eat healthy, avoid fried foods and eat more fruits and vegetables 4. Maintain adequate blood pressure, blood sugar, and blood cholesterol level. Reducing the risk of stroke and cardiovascular disease also helps promoting better memory. 5. Avoid stressful situations. Live a simple life and avoid aggravations. Organize your time and prepare for the next day in anticipation. 6. Sleep well, avoid any interruptions of sleep and avoid any distractions in the bedroom that may interfere with adequate sleep quality 7. Avoid sugar, avoid sweets as there is a strong link between excessive sugar intake, diabetes, and cognitive impairment We discussed the Mediterranean diet, which has been shown to help patients reduce the risk of progressive memory disorders and reduces cardiovascular risk. This includes eating fish, eat fruits and green leafy vegetables, nuts like almonds and hazelnuts, walnuts, and also use olive oil. Avoid fast foods and fried foods as much as possible. Avoid sweets and sugar as sugar use has been linked to worsening of memory function.  There is always a concern of gradual progression of memory problems. If this is the case, then we may need to adjust level of care according to patient needs. Support, both to the patient and caregiver, should then be put into place.         FALL PRECAUTIONS: Be cautious when walking. Scan the area for obstacles that may increase the risk of trips and falls. When getting up in the  mornings, sit up at the edge of the bed for a few minutes before getting out of bed. Consider elevating the bed at the head end to avoid drop of blood pressure when getting up. Walk always in a well-lit room (use night lights in the walls). Avoid area rugs or power cords from appliances in the middle of the walkways. Use a walker or a cane if necessary and consider physical therapy for balance exercise. Get your eyesight checked regularly.  FINANCIAL OVERSIGHT: Supervision, especially oversight when making financial decisions or transactions is also recommended.  HOME SAFETY: Consider the safety of the kitchen when operating appliances like stoves, microwave oven, and blender. Consider having supervision and share cooking responsibilities until no longer able to participate in those. Accidents with firearms and other hazards in the house should be identified and addressed as well.   ABILITY TO BE LEFT ALONE: If patient is unable to contact 911 operator, consider using LifeLine, or when the need is there, arrange for someone to stay with patients. Smoking is a fire hazard, consider supervision or cessation. Risk of wandering should be assessed by caregiver and if detected at any point, supervision and safe proof recommendations should be instituted.  MEDICATION SUPERVISION: Inability to self-administer medication needs to be constantly addressed. Implement a mechanism to ensure safe administration of the medications.   DRIVING: Regarding driving, in patients with progressive memory problems, driving will be impaired. We advise to have someone else do the driving if trouble finding directions or if minor accidents are reported. Independent driving assessment is available to determine safety of driving.  If you are interested in the driving assessment, you can contact the following:  [employer] in [place]  [place] [phone] or [phone]    [place] refers to food and lifestyle choices that are based on the traditions of countries located on the [employer]. This way of eating has been shown to help prevent certain conditions and improve outcomes for people who have chronic diseases, like kidney disease and heart disease. What are tips for following this plan? Lifestyle  Cook and eat meals together with your family, when possible. Drink enough fluid to keep your urine clear or pale yellow. Be physically active every day. This includes: Aerobic exercise like running or swimming. Leisure activities like gardening, walking, or housework. Get 7-8 hours of sleep each night. If recommended by your health care provider, drink red wine in moderation. This means 1 glass a day for nonpregnant women and 2 glasses a day for men. A glass of wine equals 5 oz (150 mL). Reading food labels  Check the serving size of packaged foods. For foods such as rice and pasta, the serving size refers to the amount of cooked product, not dry. Check the total fat in packaged foods. Avoid foods that have saturated fat or trans fats. Check the ingredients list for added sugars, such as corn syrup. Shopping  At the grocery store, buy most of your food from the areas near the walls of the store. This includes: Fresh fruits and vegetables (produce). Grains, beans, nuts, and seeds. Some of these may be available in unpackaged forms or large amounts (in bulk). Fresh seafood. Poultry and eggs. Low-fat dairy products. Buy whole ingredients instead of prepackaged foods. Buy fresh fruits and vegetables in-season from local farmers markets. Buy frozen fruits and vegetables in resealable bags. If you do not have access to quality fresh seafood, buy precooked frozen shrimp or canned fish, such as tuna, salmon, or sardines. Buy small amounts of raw or cooked  vegetables, salads, or olives from the deli or salad bar at your store. Stock your pantry so you always have certain foods on hand, such as olive oil, canned tuna, canned tomatoes, rice, pasta, and beans. Cooking  Cook foods with extra-virgin olive oil instead of using butter or other vegetable oils. Have meat as a side dish, and have vegetables or grains as your main dish. This means having meat in small portions or adding small amounts of meat to foods like pasta or stew. Use beans or vegetables instead of meat in common dishes like chili or lasagna. Experiment with different cooking methods. Try roasting or broiling vegetables instead of steaming or sauteing them. Add frozen vegetables to soups, stews, pasta, or rice. Add nuts or seeds for added healthy fat at each meal. You can add these to yogurt, salads, or vegetable dishes. Marinate fish or vegetables using olive oil, lemon juice, garlic, and fresh herbs. Meal planning  Plan to eat 1 vegetarian meal one day each week. Try to work up to 2 vegetarian meals, if possible. Eat seafood 2 or more times a week. Have healthy snacks readily available, such as: Vegetable sticks with hummus. Greek yogurt. Fruit and nut trail mix. Eat balanced meals throughout the week. This includes: Fruit: 2-3 servings a day Vegetables: 4-5 servings a day Low-fat dairy: 2 servings a day Fish, poultry, or lean meat: 1 serving a day Beans and legumes: 2 or more  servings a week Nuts and seeds: 1-2 servings a day Whole grains: 6-8 servings a day Extra-virgin olive oil: 3-4 servings a day Limit red meat and sweets to only a few servings a month What are my food choices? Mediterranean diet Recommended Grains: Whole-grain pasta. Brown rice. Bulgar wheat. Polenta. Couscous. Whole-wheat bread. [place]. Vegetables: Artichokes. Beets. Broccoli. Cabbage. Carrots. Eggplant. Green beans. Chard. Kale. Spinach. Onions. Leeks. Peas. Squash. Tomatoes. Peppers.  Radishes. Fruits: Apples. Apricots. Avocado. Berries. Bananas. Cherries. Dates. Figs. Grapes. Lemons. Melon. Oranges. Peaches. Plums. Pomegranate. Meats and other protein foods: Beans. Almonds. Sunflower seeds. Pine nuts. Peanuts. [place]. Salmon. Scallops. Shrimp. [employer]. Tilapia. Clams. Oysters. Eggs. Dairy: Low-fat milk. Cheese. Greek yogurt. Beverages: Water. Red wine. Herbal tea. Fats and oils: Extra virgin olive oil. Avocado oil. Grape seed oil. Sweets and desserts: [place] yogurt with honey. Baked apples. Poached pears. Trail mix. Seasoning and other foods: Basil. Cilantro. Coriander. Cumin. Mint. Parsley. Sage. Rosemary. Tarragon. Garlic. Oregano. Thyme. Pepper. Balsalmic vinegar. Tahini. Hummus. Tomato sauce. Olives. Mushrooms. Limit these Grains: Prepackaged pasta or rice dishes. Prepackaged cereal with added sugar. Vegetables: Deep fried potatoes (french fries). Fruits: Fruit canned in syrup. Meats and other protein foods: Beef. Pork. Lamb. Poultry with skin. Hot dogs. Berniece Salines. Dairy: Ice cream. Sour cream. Whole milk. Beverages: Juice. Sugar-sweetened soft drinks. Beer. Liquor and spirits. Fats and oils: Butter. Canola oil. Vegetable oil. Beef fat (tallow). Lard. Sweets and desserts: Cookies. Cakes. Pies. Candy. Seasoning and other foods: Mayonnaise. Premade sauces and marinades. The items listed may not be a complete list. Talk with your dietitian about what dietary choices are right for you. Summary The Mediterranean diet includes both food and lifestyle choices. Eat a variety of fresh fruits and vegetables, beans, nuts, seeds, and whole grains. Limit the amount of red meat and sweets that you eat. Talk with your health care provider about whether it is safe for you to drink red wine in moderation. This means 1 glass a day for nonpregnant women and 2 glasses a day for men. A glass of wine equals 5 oz (150 mL). This information is not intended to replace advice given to you by your health  care provider. Make sure you discuss any questions you have with your health care provider. Document Released: 10/27/2015 Document Revised: 11/29/2015 Document Reviewed: 10/27/2015 Elsevier Interactive Patient Education  2017 [employer].  We have sent a referral to [facility] for your MRI and they will call you directly to schedule your appointment. They are located at [place]. If you need to contact them directly please call [phone].

## 2021-02-20 NOTE — Progress Notes (Signed)
Assessment/Plan:   Andrea Paul is a very pleasant 65 y.o. year old RH female with risk factors including bipolar disorder, anxiety, decreased hearing seen today for evaluation of memory loss. MoCA today 29/30 with only deficiency in fluency 9/11.  MRI brain 2014 was remarkable for few punctate foci of non-specific gliosis, no other acute findings.  Exams and labs were unremarkable.  Family history of dementia.    Recommendations:   Memory Loss   MRI brain with/without contrast to assess for underlying structural abnormality and assess vascular load  If MRI findings are normal, and the symptoms worsen, will recommend neurocognitive testing to further evaluate cognitive concerns and determine underlying cause of memory changes, including potential contribution from sleep, anxiety, or depression.  Pending on the neurocognitive testing results, will follow up with neurology Discussed safety both in and out of the home.  Discussed the importance of regular daily schedule with inclusion of crossword puzzles to maintain brain function.  Continue to monitor mood with PCP and psychiatry.  Stay active at least 30 minutes at least 3 times a week.  Reduce the amount of caffeinated drinks Naps should be scheduled and should be no longer than 60 minutes and should not occur after 2 PM.  Mediterranean diet is recommended    Subjective:    The patient is seen in neurologic consultation at the request of Early, Coralee Pesa, NP for the evaluation of memory difficulties.  The patient is here alone this is a 65 y.o. year old right-handed female who has had gradual memory issues for about 1 year, when she reports "having a thought, then the thought vanishes, which disturbs me ".  "I am not sure why this happens ".  Her husband is concerned that she does not remember a lot.  When he asked the question about an event, she responds that she does not remember that she asked that question before.  She has been  under a lot of stress, with her mother having died about 2 years ago, and her father being in nursing home.  She states that she likes to write long letters but lately she has to make a copy of the letter, so that she makes sure that she does not repeat the same content.  She likes to write in her journal as well and is working on a children's book.  She denies leaving objects in unusual places.  She denies any issues with driving, denies getting lost.  She reports having "moody moments but that is because of the bipolar disorder, no changes ".  She denies any changes in depression.  She stays active, lifts weights at home, and is going to start increasing her walking after recently buying new orthotics due to posterior tibial tendinitis, which are going to be helpful for trying longer distances.  She sleeps well, she does report vivid dreams for the last 2 years, without REM behavior.  She denies sleepwalking or hallucinations or paranoia.  No hygiene concerns, she is independent of bathing and dressing.  She denies missing any medications, and she is up-to-date with finances.  Appetite is good, denies trouble swallowing.  She continues to cook and denies leaving the stove or the faucet on, ambulates without difficulty without the use of a walker or a cane. Denies headaches, double vision, dizziness, focal numbness or tingling, unilateral weakness or tremors or anosmia.  She is scheduled to have earwax removal this week, which will help with her hearing, as well as some  of her balance issues "because of fluid in the ear ".  No history of seizures. Denies urine incontinence, retention, constipation or diarrhea.  She states having a history of OSA only when she sleeps on her back, no issues when sleeping on her side.  No history of ETOH or Tobacco. Family History Mother died 2 y ago with rheumatoid arthritis, and had dementia in the later stages, father age 78 in a nursing home, with dementia "has no short-term  memory ", as well as in and with Alzheimer's disease    Equivocal MRI brain 2014  (with and without) demonstrating:  1. Few punctate foci of non-specific gliosis.  2. No acute findings.   Labs 02/13/2021, TSH 1.0, T4 8, normal.  Vitamin D34.8, vitamin B12 652.   Allergies  Allergen Reactions   Latex Itching    Current Outpatient Medications  Medication Instructions   Acetylcarnitine HCl (ACETYL L-CARNITINE) 500 MG CAPS 1 capsule, Oral, Daily   ARIPiprazole (ABILIFY) 20 MG tablet 1 tablet, Oral, Daily   B Complex-Folic Acid (Q-259 BALANCED TR PO) 1 tablet, Oral, Daily   buPROPion (WELLBUTRIN XL) 300 MG 24 hr tablet Oral   clonazePAM (KLONOPIN) 0.5 MG tablet Oral   Coenzyme Q10 (COQ10 PO) 1 capsule, Oral, Daily   escitalopram (LEXAPRO) 20 mg, Oral, Daily   lamoTRIgine (LAMICTAL) 300 mg, Oral, Daily   montelukast (SINGULAIR) 10 mg, Oral, Daily at bedtime   Multiple Vitamin (MULTIVITAMIN WITH MINERALS) TABS 1 tablet, Daily   Omega-3 Fatty Acids (FISH OIL PO) 1 capsule, Oral, Daily   Probiotic Product (PROBIOTIC DAILY PO) 1 tablet, Oral, Daily   simvastatin (ZOCOR) 20 mg, Every evening   VITAMIN C, CALCIUM ASCORBATE, PO 1 tablet, Oral, Daily   VITAMIN D, CHOLECALCIFEROL, PO 1 tablet, Oral, Daily     VITALS:   Vitals:   02/20/21 0937  BP: (!) 112/55  Pulse: 81  Resp: 20  SpO2: 98%  Weight: 171 lb (77.6 kg)  Height: 5\' 10"  (1.778 m)   Depression screen PHQ 2/9 12/19/2020  Decreased Interest 0  Down, Depressed, Hopeless 0  PHQ - 2 Score 0    PHYSICAL EXAM   HEENT:  Normocephalic, atraumatic. The mucous membranes are moist. The superficial temporal arteries are without ropiness or tenderness. Cardiovascular: Regular rate and rhythm. Lungs: Clear to auscultation bilaterally. Neck: There are no carotid bruits noted bilaterally.  NEUROLOGICAL: Montreal Cognitive Assessment  02/20/2021  Visuospatial/ Executive (0/5) 5  Naming (0/3) 3  Attention: Read list of digits  (0/2) 2  Attention: Read list of letters (0/1) 1  Attention: Serial 7 subtraction starting at 100 (0/3) 3  Language: Repeat phrase (0/2) 2  Language : Fluency (0/1) 0  Abstraction (0/2) 2  Delayed Recall (0/5) 5  Orientation (0/6) 6  Total 29   No flowsheet data found.  No flowsheet data found.   Orientation:  Alert and oriented to person, place and time. No aphasia or dysarthria. Fund of knowledge is appropriate. Recent memory normal and remote memory intact.  Attention and concentration are normal.  Able to name objects and repeat phrases. Delayed recall  5/5 Cranial nerves: There is good facial symmetry. Extraocular muscles are intact and visual fields are full to confrontational testing. Speech is fluent and clear. Soft palate rises symmetrically and there is no tongue deviation. Hearing is intact to conversational tone. Tone: Tone is good throughout. Sensation: Sensation is intact to light touch and pinprick throughout. Vibration is intact at the bilateral big toe.There is  no extinction with double simultaneous stimulation. There is no sensory dermatomal level identified. Coordination: The patient has no difficulty with RAM's or FNF bilaterally. Normal finger to nose  Motor: Strength is 5/5 in the bilateral upper and lower extremities. There is no pronator drift. There are no fasciculations noted. DTR's: Deep tendon reflexes are 2/4 at the bilateral biceps, triceps, brachioradialis, patella and achilles.  Plantar responses are downgoing bilaterally. Gait and Station: The patient is able to ambulate without difficulty.The patient is able to heel toe walk without any difficulty.The patient is able to ambulate in a tandem fashion. The patient is able to stand in the Romberg position.     Thank you for allowing [place] the opportunity to participate in the care of this nice patient. Please do not hesitate to contact us for any questions or concerns.   Total time spent on today's visit was 60  minutes, including both face-to-face time and nonface-to-face time.  Time included that spent on review of records (prior notes available to me/labs/imaging if pertinent), discussing treatment and goals, answering patient's questions and coordinating care.  Cc:  Orma Render, NP  Sharene Butters 02/20/2021 12:10 PM

## 2021-02-21 ENCOUNTER — Encounter (HOSPITAL_BASED_OUTPATIENT_CLINIC_OR_DEPARTMENT_OTHER): Payer: Self-pay | Admitting: Nurse Practitioner

## 2021-02-21 ENCOUNTER — Ambulatory Visit (INDEPENDENT_AMBULATORY_CARE_PROVIDER_SITE_OTHER): Payer: Medicare Other | Admitting: Nurse Practitioner

## 2021-02-21 DIAGNOSIS — N952 Postmenopausal atrophic vaginitis: Secondary | ICD-10-CM | POA: Insufficient documentation

## 2021-02-21 DIAGNOSIS — H6123 Impacted cerumen, bilateral: Secondary | ICD-10-CM | POA: Diagnosis not present

## 2021-02-21 DIAGNOSIS — G9332 Myalgic encephalomyelitis/chronic fatigue syndrome: Secondary | ICD-10-CM | POA: Insufficient documentation

## 2021-02-21 DIAGNOSIS — M62838 Other muscle spasm: Secondary | ICD-10-CM | POA: Diagnosis not present

## 2021-02-21 DIAGNOSIS — G4733 Obstructive sleep apnea (adult) (pediatric): Secondary | ICD-10-CM | POA: Insufficient documentation

## 2021-02-21 DIAGNOSIS — R7303 Prediabetes: Secondary | ICD-10-CM | POA: Insufficient documentation

## 2021-02-21 DIAGNOSIS — J309 Allergic rhinitis, unspecified: Secondary | ICD-10-CM | POA: Insufficient documentation

## 2021-02-21 DIAGNOSIS — G4700 Insomnia, unspecified: Secondary | ICD-10-CM | POA: Insufficient documentation

## 2021-02-21 DIAGNOSIS — E785 Hyperlipidemia, unspecified: Secondary | ICD-10-CM | POA: Insufficient documentation

## 2021-02-21 DIAGNOSIS — F331 Major depressive disorder, recurrent, moderate: Secondary | ICD-10-CM | POA: Insufficient documentation

## 2021-02-21 MED ORDER — CYCLOBENZAPRINE HCL 5 MG PO TABS: 5.0000 mg | ORAL_TABLET | Freq: Three times a day (TID) | ORAL | 1 refills | Status: DC | PRN

## 2021-02-21 NOTE — Progress Notes (Signed)
Established Patient Office Visit  Subjective:  Patient ID: Andrea Paul, female    DOB: 03/21/1955  Age: 65 y.o. MRN: [id number]  CC: No chief complaint on file.   HPI Andrea Paul presents for bilateral ear irrigation for cerumen impaction.  She endorses difficulty hearing out of her ears bilaterally with left > right.  At her last visit it was noted that her ears were impacted with cerumen.  She has no pain or drainage from the ears.   She also endorses pain in the left side of her neck intermittently with sharp, spasm like pain.  She denies any known provokers of the symptom.  She reports that when it occurs it is quite painful. She denies erythema, edema, dizziness, headache, vision changes, or weakness with presentation of the pain.  She has not tried anything to prevent this.   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    Heart murmur    History of blood transfusion    age 38 years old   History of periodontal disease    Pain in gums 01/16/2013   Pre-diabetes    Sleep apnea    no CPAP prescribed, sleeps on sides    Past Surgical History:  Procedure Laterality Date   ANTERIOR CERVICAL DECOMP/DISCECTOMY FUSION  09/17/2011   Procedure: ANTERIOR CERVICAL DECOMPRESSION/DISCECTOMY FUSION 2 LEVELS;  Surgeon: Hosie Spangle, MD;  Location: [facility];  Service: Neurosurgery;  Laterality: Bilateral;  Cervical five-six, six-seven anterior cervical decompression with fusion plating and bonegraft   BASAL CELL CARCINOMA EXCISION  2010   COLONOSCOPY  2014   DILATATION & CURETTAGE/HYSTEROSCOPY WITH MYOSURE N/A 12/19/2015   Procedure: [place];  Surgeon: Megan Salon, MD;  Location: [facility];  Service: Gynecology;  Laterality: N/A;    GANGLION CYST EXCISION     right wrist   TONSILLECTOMY      Family History  Problem Relation Age of Onset   Dementia Mother 25   Rheum arthritis Mother    Alcohol abuse Father    Anxiety disorder Father    Depression Father    Dementia Father 56   Paranoid behavior Father    CAD Father    Depression Sister    Alcohol abuse Brother    Bipolar disorder Brother    Drug abuse Brother    Sexual abuse Paternal Grandfather    Depression Brother    Depression Sister    OCD Neg Hx    Schizophrenia Neg Hx    Thyroid disease Neg Hx     Social History   Socioeconomic History   Marital status: Married    Spouse name: Not on file   Number of children: Not on file   Years of education: 18   Highest education level: Not on file  Occupational History   Not on file  Tobacco Use   Smoking status: Never   Smokeless tobacco: Never  Vaping Use   Vaping Use: Never used  Substance and Sexual Activity   Alcohol use: No   Drug use: No    Comment: Caffeine: 2 cups coffee   Sexual activity: Yes    Partners: Male  Birth control/protection: Post-menopausal  Other Topics Concern   Not on file  Social History Narrative   Not on file   Social Determinants of Health   Financial Resource Strain: Not on file  Food Insecurity: Not on file  Transportation Needs: Not on file  Physical Activity: Not on file  Stress: Not on file  Social Connections: Not on file  Intimate Partner Violence: Not on file    Outpatient Medications Prior to Visit  Medication Sig Dispense Refill   Acetylcarnitine HCl (ACETYL L-CARNITINE) 500 MG CAPS Take 1 capsule by mouth daily.     ARIPiprazole (ABILIFY) 20 MG tablet Take 1 tablet by mouth daily.     B Complex-Folic Acid (V-371 BALANCED TR PO) Take 1 tablet by mouth daily.     buPROPion (WELLBUTRIN XL) 300 MG 24 hr tablet Take by mouth.     clonazePAM (KLONOPIN) 0.5 MG tablet Take by mouth.     Coenzyme Q10 (COQ10 PO) Take 1 capsule by mouth daily.      escitalopram (LEXAPRO) 20 MG tablet Take 20 mg by mouth daily.     Green Tea, Camellia sinensis, (GREEN TEA EXTRACT) 150 MG CAPS See admin instructions.     lamoTRIgine (LAMICTAL) 150 MG tablet Take 2 tablets (300 mg total) by mouth daily. 60 tablet 0   montelukast (SINGULAIR) 10 MG tablet Take 1 tablet (10 mg total) by mouth at bedtime. 30 tablet 3   Multiple Vitamin (MULTIVITAMIN WITH MINERALS) TABS Take 1 tablet by mouth daily.     Omega-3 Fatty Acids (FISH OIL PO) Take 1 capsule by mouth daily.      Probiotic Product (PROBIOTIC DAILY PO) Take 1 tablet by mouth daily.     Resveratrol 100 MG CAPS 1 capsule     simvastatin (ZOCOR) 20 MG tablet Take 20 mg by mouth every evening.     VITAMIN C, CALCIUM ASCORBATE, PO Take 1 tablet by mouth daily.     VITAMIN D, CHOLECALCIFEROL, PO Take 1 tablet by mouth daily.      folic acid (FOLATE) 062 MCG tablet 1 tablet     Omega 3-6-9 CAPS See admin instructions.     No facility-administered medications prior to visit.    Allergies  Allergen Reactions   Latex Itching    ROS Review of Systems    Objective:    Physical Exam Vitals and nursing note reviewed.  Constitutional:      Appearance: Normal appearance.  HENT:     Head: Normocephalic.     Right Ear: There is impacted cerumen.     Left Ear: There is impacted cerumen.  Eyes:     Extraocular Movements: Extraocular movements intact.     Conjunctiva/sclera: Conjunctivae normal.     Pupils: Pupils are equal, round, and reactive to light.  Neck:     Thyroid: No thyromegaly or thyroid tenderness.     Vascular: Normal carotid pulses. No carotid bruit or JVD.   Cardiovascular:     Rate and Rhythm: Normal rate.  Pulmonary:     Effort: Pulmonary effort is normal.  Musculoskeletal:     Cervical back: Tenderness present. No edema, erythema, rigidity or crepitus. Muscular tenderness present. No pain with movement or spinous process tenderness. Normal range of motion.  Lymphadenopathy:      Cervical: No cervical adenopathy.     Right cervical: No superficial, deep or posterior cervical adenopathy.    Left cervical: No superficial, deep or posterior cervical adenopathy.  Skin:  General: Skin is warm and dry.     Capillary Refill: Capillary refill takes less than 2 seconds.  Neurological:     General: No focal deficit present.     Mental Status: She is alert and oriented to person, place, and time.     Cranial Nerves: No cranial nerve deficit.  Psychiatric:        Mood and Affect: Mood normal.        Behavior: Behavior normal.        Thought Content: Thought content normal.        Judgment: Judgment normal.    LMP 10/18/2010  Wt Readings from Last 3 Encounters:  02/20/21 171 lb (77.6 kg)  02/13/21 169 lb 12.8 oz (77 kg)  12/19/20 170 lb (77.1 kg)     Health Maintenance Due  Topic Date Due   Pneumonia Vaccine 45+ Years old (1 - PCV) Never done   COVID-19 Vaccine (4 - Booster for Moderna series) 10/21/2020   MAMMOGRAM  02/15/2021    There are no preventive care reminders to display for this patient.  Lab Results  Component Value Date   TSH 1.000 02/13/2021   Lab Results  Component Value Date   WBC 6.6 09/13/2011   HGB 13.8 09/13/2011   HCT 41.8 09/13/2011   MCV 95.4 09/13/2011   PLT 186 09/13/2011   No results found for: NA, K, CHLORIDE, CO2, GLUCOSE, BUN, CREATININE, BILITOT, ALKPHOS, AST, ALT, PROT, ALBUMIN, CALCIUM, ANIONGAP, EGFR, GFR No results found for: CHOL No results found for: HDL No results found for: LDLCALC No results found for: TRIG No results found for: CHOLHDL No results found for: HGBA1C    Assessment & Plan:   Problem List Items Addressed This Visit     Bilateral hearing loss due to cerumen impaction - Primary    Bilateral cerumen impaction present on examination with complete obstruction of the TM bilaterally.  Verbal consent obtained prior to initiation of cerumen removal. Risks and benefits reviewed.  Instillation of  liquid docusate sodium warmed to body temperature into one ear canal at a time and allowed to sit for approximately 3-5 minutes.  Solution of body temperature tap water prepared in elephant ear wash container with clean irrigation tip applied.  Patient draped appropriately and irrigation performed bilaterally.  Ears dried and evaluated after irrigation to show clear canal with intact TM.  No erythema, drainage, ulceration present.  Patient tolerated procedure well.        Muscle spasms of neck    Spasm noted in sternocleidomastoid musculature on left side with tenderness and tightness noted in trapezius.  Recommend ice and heat to area daily 20 minutes each. Recommend gentle neck stretches and avoidance of holding head in positions of extension or flexion for extended periods.  Will send cyclobenzaprine to pharmacy for use with severe spasms and to help relax musculature.  F/U if symptoms worsen or fail to improve.       Relevant Medications   cyclobenzaprine (FLEXERIL) 5 MG tablet    Meds ordered this encounter  Medications   cyclobenzaprine (FLEXERIL) 5 MG tablet    Sig: Take 1 tablet (5 mg total) by mouth 3 (three) times daily as needed for muscle spasms. May cause drowsiness.    Dispense:  30 tablet    Refill:  1    Follow-up: Return if symptoms worsen or fail to improve.    Orma Render, NP

## 2021-02-21 NOTE — Assessment & Plan Note (Signed)
Bilateral cerumen impaction present on examination with complete obstruction of the TM bilaterally.  Verbal consent obtained prior to initiation of cerumen removal. Risks and benefits reviewed.  Instillation of liquid docusate sodium warmed to body temperature into one ear canal at a time and allowed to sit for approximately 3-5 minutes.  Solution of body temperature tap water prepared in elephant ear wash container with clean irrigation tip applied.  Patient draped appropriately and irrigation performed bilaterally.  Ears dried and evaluated after irrigation to show clear canal with intact TM.  No erythema, drainage, ulceration present.  Patient tolerated procedure well.

## 2021-02-21 NOTE — Patient Instructions (Signed)
I recommend using ice and heat to the neck for 20 minutes each nightly to help with neck spasms.  Gentle stretching exercises attached can be helpful to loosen the muscles and prevent spasm.  Avoid spending extended periods of time looking down or up to prevent straining of the muscles.  I have sent cyclobenzaprine to the pharmacy to use for spasms.  It is sometimes helpful to take the medication before bedtime for 3-4 days, use ice and then heat on the neck, then perform the stretches to help stop the cycle of spasms.

## 2021-02-21 NOTE — Assessment & Plan Note (Signed)
Spasm noted in sternocleidomastoid musculature on left side with tenderness and tightness noted in trapezius.  Recommend ice and heat to area daily 20 minutes each. Recommend gentle neck stretches and avoidance of holding head in positions of extension or flexion for extended periods.  Will send cyclobenzaprine to pharmacy for use with severe spasms and to help relax musculature.  F/U if symptoms worsen or fail to improve.

## 2021-03-22 ENCOUNTER — Other Ambulatory Visit: Payer: Self-pay

## 2021-03-22 ENCOUNTER — Ambulatory Visit
Admission: RE | Admit: 2021-03-22 | Discharge: 2021-03-22 | Disposition: A | Discharge status: Home / Self Care | Payer: Medicare Other | Source: Ambulatory Visit | Attending: Physician Assistant | Admitting: Physician Assistant

## 2021-03-22 MED ORDER — GADOBENATE DIMEGLUMINE 529 MG/ML IV SOLN
15.0000 mL | Freq: Once | INTRAVENOUS | Status: AC | PRN
  Administered 2021-03-22: 15 mL via INTRAVENOUS

## 2021-03-23 ENCOUNTER — Encounter: Payer: Self-pay | Admitting: Physician Assistant

## 2021-04-06 ENCOUNTER — Encounter (HOSPITAL_BASED_OUTPATIENT_CLINIC_OR_DEPARTMENT_OTHER): Payer: Self-pay | Admitting: *Deleted

## 2021-04-07 ENCOUNTER — Encounter (HOSPITAL_BASED_OUTPATIENT_CLINIC_OR_DEPARTMENT_OTHER): Payer: Self-pay | Admitting: *Deleted

## 2021-04-07 DIAGNOSIS — Z13820 Encounter for screening for osteoporosis: Secondary | ICD-10-CM

## 2021-04-07 DIAGNOSIS — Z780 Asymptomatic menopausal state: Secondary | ICD-10-CM

## 2021-04-07 DIAGNOSIS — E2839 Other primary ovarian failure: Secondary | ICD-10-CM

## 2021-05-11 ENCOUNTER — Other Ambulatory Visit (HOSPITAL_BASED_OUTPATIENT_CLINIC_OR_DEPARTMENT_OTHER): Payer: Self-pay | Admitting: Nurse Practitioner

## 2021-07-11 ENCOUNTER — Encounter (HOSPITAL_BASED_OUTPATIENT_CLINIC_OR_DEPARTMENT_OTHER): Payer: Self-pay | Admitting: Nurse Practitioner

## 2021-07-11 ENCOUNTER — Ambulatory Visit (INDEPENDENT_AMBULATORY_CARE_PROVIDER_SITE_OTHER): Payer: Medicare Other | Admitting: Nurse Practitioner

## 2021-07-11 VITALS — BP 124/68 | HR 72 | Temp 97.7°F | Ht 70.0 in | Wt 171.5 lb

## 2021-07-11 DIAGNOSIS — K5900 Constipation, unspecified: Secondary | ICD-10-CM | POA: Insufficient documentation

## 2021-07-11 DIAGNOSIS — R141 Gas pain: Secondary | ICD-10-CM | POA: Insufficient documentation

## 2021-07-11 DIAGNOSIS — R635 Abnormal weight gain: Secondary | ICD-10-CM

## 2021-07-11 DIAGNOSIS — F3181 Bipolar II disorder: Secondary | ICD-10-CM

## 2021-07-11 DIAGNOSIS — R7303 Prediabetes: Secondary | ICD-10-CM | POA: Diagnosis not present

## 2021-07-11 DIAGNOSIS — K625 Hemorrhage of anus and rectum: Secondary | ICD-10-CM

## 2021-07-11 DIAGNOSIS — K2100 Gastro-esophageal reflux disease with esophagitis, without bleeding: Secondary | ICD-10-CM | POA: Insufficient documentation

## 2021-07-11 DIAGNOSIS — Z23 Encounter for immunization: Secondary | ICD-10-CM | POA: Diagnosis not present

## 2021-07-11 DIAGNOSIS — Z0000 Encounter for general adult medical examination without abnormal findings: Secondary | ICD-10-CM

## 2021-07-11 DIAGNOSIS — R194 Change in bowel habit: Secondary | ICD-10-CM | POA: Insufficient documentation

## 2021-07-11 DIAGNOSIS — E785 Hyperlipidemia, unspecified: Secondary | ICD-10-CM | POA: Diagnosis not present

## 2021-07-11 DIAGNOSIS — K219 Gastro-esophageal reflux disease without esophagitis: Secondary | ICD-10-CM | POA: Insufficient documentation

## 2021-07-11 HISTORY — DX: Abnormal weight gain: R63.5

## 2021-07-11 HISTORY — DX: Flatulence: R14.1

## 2021-07-11 HISTORY — DX: Hemorrhage of anus and rectum: K62.5

## 2021-07-11 MED ORDER — ZOSTER VAC RECOMB ADJUVANTED 50 MCG/0.5ML IM SUSR: 0.5000 mL | Freq: Once | INTRAMUSCULAR | 1 refills | Status: AC

## 2021-07-11 NOTE — Progress Notes (Deleted)
? ?Subjective:  ? Magdalene Tardiff is a 66 y.o. female who presents for an Initial Medicare Annual Wellness Visit. ? ?Review of Systems    ?Positive for: ?Moderate depressive symptoms ?Moderate pain ?Fatigue ?Sexual dysfunction ?All other ROS negative ? ?   ?Objective:  ?  ?Today's Vitals  ? 07/11/21 0909 07/21/21 1731  ?BP: 124/68   ?Pulse: 72   ?Temp: 97.7 ?F (36.5 ?C)   ?SpO2: 99%   ?Weight: 171 lb 8 oz (77.8 kg)   ?Height: '5\' 10"'$  (1.778 m)   ?PainSc:  3   ? ?Body mass index is 24.61 kg/m?. ? ? ?  07/21/2021  ?  5:39 PM 02/20/2021  ?  9:38 AM 12/16/2015  ?  1:49 PM 08/18/2013  ?  8:26 PM 07/06/2013  ?  8:44 PM 09/13/2011  ? 10:33 AM  ?Advanced Directives  ?Does Patient Have a Medical Advance Directive? Yes Yes No Patient does not have advance directive;Patient would like information Patient does not have advance directive;Patient would like information Patient has advance directive, copy not in chart  ?Type of Paramedic of [place];Living will     [place];Living will  ?Does patient want to make changes to medical advance directive? No - Patient declined       ?Copy of [place] in Chart? No - copy requested       ?Would patient like information on creating a medical advance directive?   Yes - Educational materials given     ?Pre-existing out of facility DNR order (yellow form or pink MOST form)      No  ? ? ?Current Medications (verified) ?Outpatient Encounter Medications as of 07/11/2021  ?Medication Sig  ? Acetylcarnitine HCl (ACETYL L-CARNITINE) 500 MG CAPS Take 1 capsule by mouth daily.  ? ARIPiprazole (ABILIFY) 20 MG tablet Take 1 tablet by mouth daily.  ? B Complex-Folic Acid (T-062 BALANCED TR PO) Take 1 tablet by mouth daily.  ? buPROPion (WELLBUTRIN XL) 150 MG 24 hr tablet Take by mouth.  ? buPROPion (WELLBUTRIN XL) 300 MG 24 hr tablet Take by mouth.  ? clonazePAM (KLONOPIN) 0.5 MG tablet Take by mouth.  ? Coenzyme Q10 (COQ10 PO) Take 1  capsule by mouth daily.  ? cyclobenzaprine (FLEXERIL) 5 MG tablet Take 1 tablet (5 mg total) by mouth 3 (three) times daily as needed for muscle spasms. May cause drowsiness.  ? escitalopram (LEXAPRO) 20 MG tablet Take 20 mg by mouth daily.  ? Green Tea, Camellia sinensis, (GREEN TEA EXTRACT) 150 MG CAPS See admin instructions.  ? lamoTRIgine (LAMICTAL) 150 MG tablet Take 2 tablets (300 mg total) by mouth daily.  ? montelukast (SINGULAIR) 10 MG tablet TAKE ONE TABLET BY MOUTH EVERY NIGHT AT BEDTIME  ? Multiple Vitamin (MULTIVITAMIN WITH MINERALS) TABS Take 1 tablet by mouth daily.  ? Omega-3 Fatty Acids (FISH OIL PO) Take 1 capsule by mouth daily.   ? Probiotic Product (PROBIOTIC DAILY PO) Take 1 tablet by mouth daily.  ? Resveratrol 100 MG CAPS 1 capsule  ? simvastatin (ZOCOR) 20 MG tablet Take 20 mg by mouth every evening.  ? VITAMIN C, CALCIUM ASCORBATE, PO Take 1 tablet by mouth daily.  ? VITAMIN D, CHOLECALCIFEROL, PO Take 1 tablet by mouth daily.   ? [EXPIRED] Zoster Vaccine Adjuvanted [facility]) injection Inject 0.5 mLs into the muscle once for 1 dose. Repeat in 2-6 months. Please fax confirmation of vaccination to Worthy Keeler, DNP at [phone]  ? ?  No facility-administered encounter medications on file as of 07/11/2021.  ? ? ?Allergies (verified) ?Latex  ? ?History: ?Past Medical History:  ?Diagnosis Date  ? Anemia   ? history of  ? Anxiety   ? does not take daily unless needed  ? Arthritis   ? bilateral hands  ? Basal cell carcinoma   ? Bipolar 1 disorder ([place])   ?    ? Burning mouth syndrome   ? on [place]   ? Heart murmur   ? History of blood transfusion   ? age 64 years old  ? History of periodontal disease   ? Pain in gums 01/16/2013  ? Pre-diabetes   ? Sleep apnea   ? no CPAP prescribed, sleeps on sides  ? ?Past Surgical History:  ?Procedure Laterality Date  ? ANTERIOR CERVICAL DECOMP/DISCECTOMY FUSION  09/17/2011  ? Procedure: ANTERIOR CERVICAL DECOMPRESSION/DISCECTOMY FUSION 2 LEVELS;  Surgeon:  Hosie Spangle, MD;  Location: [facility];  Service: Neurosurgery;  Laterality: Bilateral;  Cervical five-six, six-seven anterior cervical decompression with fusion plating and bonegraft  ? BASAL CELL CARCINOMA EXCISION  2010  ? COLONOSCOPY  2014  ? DILATATION & CURETTAGE/HYSTEROSCOPY WITH MYOSURE N/A 12/19/2015  ? Procedure: [place];  Surgeon: Megan Salon, MD;  Location: [facility];  Service: Gynecology;  Laterality: N/A;  ? GANGLION CYST EXCISION    ? right wrist  ? TONSILLECTOMY    ? ?Family History  ?Problem Relation Age of Onset  ? Dementia Mother 15  ? Rheum arthritis Mother   ? Alcohol abuse Father   ? Anxiety disorder Father   ? Depression Father   ? Dementia Father 46  ? Paranoid behavior Father   ? CAD Father   ? Depression Sister   ? Alcohol abuse Brother   ? Bipolar disorder Brother   ? Drug abuse Brother   ? Sexual abuse Paternal Grandfather   ? Depression Brother   ? Depression Sister   ? OCD Neg Hx   ? Schizophrenia Neg Hx   ? Thyroid disease Neg Hx   ? ?Social History  ? ?Socioeconomic History  ? Marital status: Married  ?  Spouse name: Not on file  ? Number of children: Not on file  ? Years of education: 83  ? Highest education level: Not on file  ?Occupational History  ? Not on file  ?Tobacco Use  ? Smoking status: Never  ? Smokeless tobacco: Never  ?Vaping Use  ? Vaping Use: Never used  ?Substance and Sexual Activity  ? Alcohol use: No  ? Drug use: No  ?  Comment: Caffeine: 2 cups coffee  ? Sexual activity: Yes  ?  Partners: Male  ?  Birth control/protection: Post-menopausal  ?Other Topics Concern  ? Not on file  ?Social History Narrative  ? Not on file  ? ?Social Determinants of Health  ? ?Financial Resource Strain: Low Risk   ? Difficulty of Paying Living Expenses: Not hard at all  ?Food Insecurity: No Food Insecurity  ? Worried About Charity fundraiser in the Last Year: Never true  ? Ran Out of Food in the Last Year: Never true  ?Transportation  Needs: No Transportation Needs  ? Lack of Transportation (Medical): No  ? Lack of Transportation (Non-Medical): No  ?Physical Activity: Inactive  ? Days of Exercise per Week: 0 days  ? Minutes of Exercise per Session: 0 min  ?Stress: Stress Concern Present  ? Feeling of Stress :  To some extent  ?Social Connections: Moderately Integrated  ? Frequency of Communication with Friends and Family: More than three times a week  ? Frequency of Social Gatherings with Friends and Family: More than three times a week  ? Attends Religious Services: Never  ? Active Member of Clubs or Organizations: Yes  ? Attends Archivist Meetings: More than 4 times per year  ? Marital Status: Married  ? ? ?Tobacco Counseling ?Counseling given: Not Answered ? ? ?Clinical Intake: ? ?Pre-visit preparation completed: Yes ? ?Pain : 0-10 ?Pain Score: 3  ?Pain Type: Chronic pain ?Pain Location: Other (Comment) ?Pain Onset: More than a month ago ?Pain Frequency: Several days a week ? ?  ? ?Nutritional Status: BMI 25 -29 Overweight ?Nutritional Risks: None ?Diabetes: No ? ?How often do you need to have someone help you when you read instructions, pamphlets, or other written materials from your doctor or pharmacy?: 1 - Never ? ?Diabetic?no ? ?Interpreter Needed?: No ? ?  ? ? ?Activities of Daily Living ? ?  07/11/2021  ?  9:10 AM  ?In your present state of health, do you have any difficulty performing the following activities:  ?Hearing? 0  ?Vision? 0  ?Difficulty concentrating or making decisions? 1  ?Walking or climbing stairs? 0  ?Dressing or bathing? 0  ?Doing errands, shopping? 0  ? ? ?Patient Care Team: ?Brendin Situ, Coralee Pesa, NP as PCP - General (Nurse Practitioner) ? ?Indicate any recent Medical Services you may have received from other than [facility] providers in the past year (date may be approximate). ? ?   ?Assessment:  ? This is a routine wellness examination for [place]. ? ?Hearing/Vision screen ?Vision Screening  ? Right eye Left eye Both  eyes  ?Without correction     ?With correction '20/20 20/20 20/20 '$  ? ? ?Dietary issues and exercise activities discussed: ?  ? ? Goals Addressed   ? ?  ?  ?  ?  ? This Visit's Progress  ?  Patient Stated     ?  Impr

## 2021-07-11 NOTE — Patient Instructions (Addendum)
[employer] ?Dr. Manuella Ghazi ?[phone] ? ?Preventive Care 29 Years and Older, Female ?Preventive care refers to lifestyle choices and visits with your health care provider that can promote health and wellness. Preventive care visits are also called wellness exams. ?What can I expect for my preventive care visit? ?Counseling ?Your health care provider may ask you questions about your: ?Medical history, including: ?Past medical problems. ?Family medical history. ?Pregnancy and menstrual history. ?History of falls. ?Current health, including: ?Memory and ability to understand (cognition). ?Emotional well-being. ?Home life and relationship well-being. ?Sexual activity and sexual health. ?Lifestyle, including: ?Alcohol, nicotine or tobacco, and drug use. ?Access to firearms. ?Diet, exercise, and sleep habits. ?Work and work Statistician. ?Sunscreen use. ?Safety issues such as seatbelt and bike helmet use. ?Physical exam ?Your health care provider will check your: ?Height and weight. These may be used to calculate your BMI (body mass index). BMI is a measurement that tells if you are at a healthy weight. ?Waist circumference. This measures the distance around your waistline. This measurement also tells if you are at a healthy weight and may help predict your risk of certain diseases, such as type 2 diabetes and high blood pressure. ?Heart rate and blood pressure. ?Body temperature. ?Skin for abnormal spots. ?What immunizations do I need? ? ?Vaccines are usually given at various ages, according to a schedule. Your health care provider will recommend vaccines for you based on your age, medical history, and lifestyle or other factors, such as travel or where you work. ?What tests do I need? ?Screening ?Your health care provider may recommend screening tests for certain conditions. This may include: ?Lipid and cholesterol levels. ?Hepatitis C test. ?Hepatitis B test. ?HIV (human immunodeficiency virus) test. ?STI  (sexually transmitted infection) testing, if you are at risk. ?Lung cancer screening. ?Colorectal cancer screening. ?Diabetes screening. This is done by checking your blood sugar (glucose) after you have not eaten for a while (fasting). ?Mammogram. Talk with your health care provider about how often you should have regular mammograms. ?BRCA-related cancer screening. This may be done if you have a family history of breast, ovarian, tubal, or peritoneal cancers. ?Bone density scan. This is done to screen for osteoporosis. ?Talk with your health care provider about your test results, treatment options, and if necessary, the need for more tests. ?Follow these instructions at home: ?Eating and drinking ? ?Eat a diet that includes fresh fruits and vegetables, whole grains, lean protein, and low-fat dairy products. Limit your intake of foods with high amounts of sugar, saturated fats, and salt. ?Take vitamin and mineral supplements as recommended by your health care provider. ?Do not drink alcohol if your health care provider tells you not to drink. ?If you drink alcohol: ?Limit how much you have to 0-1 drink a day. ?Know how much alcohol is in your drink. In the [place] one drink equals one 12 oz bottle of beer (355 mL), one 5 oz glass of wine (148 mL), or one 1? oz glass of hard liquor (44 mL). ?Lifestyle ?Brush your teeth every morning and night with fluoride toothpaste. Floss one time each day. ?Exercise for at least 30 minutes 5 or more days each week. ?Do not use any products that contain nicotine or tobacco. These products include cigarettes, chewing tobacco, and vaping devices, such as e-cigarettes. If you need help quitting, ask your health care provider. ?Do not use drugs. ?If you are sexually active, practice safe sex. Use a condom or other form of protection in order to prevent  STIs. ?Take aspirin only as told by your health care provider. Make sure that you understand how much to take and what form to take. Work  with your health care provider to find out whether it is safe and beneficial for you to take aspirin daily. ?Ask your health care provider if you need to take a cholesterol-lowering medicine (statin). ?Find healthy ways to manage stress, such as: ?Meditation, yoga, or listening to music. ?Journaling. ?Talking to a trusted person. ?Spending time with friends and family. ?Minimize exposure to UV radiation to reduce your risk of skin cancer. ?Safety ?Always wear your seat belt while driving or riding in a vehicle. ?Do not drive: ?If you have been drinking alcohol. Do not ride with someone who has been drinking. ?When you are tired or distracted. ?While texting. ?If you have been using any mind-altering substances or drugs. ?Wear a helmet and other protective equipment during sports activities. ?If you have firearms in your house, make sure you follow all gun safety procedures. ?What's next? ?Visit your health care provider once a year for an annual wellness visit. ?Ask your health care provider how often you should have your eyes and teeth checked. ?Stay up to date on all vaccines. ?This information is not intended to replace advice given to you by your health care provider. Make sure you discuss any questions you have with your health care provider. ?Document Revised: 08/31/2020 Document Reviewed: 08/31/2020 ?Elsevier Patient Education ? [place]. ? ?Bone Density Test ?A bone density test uses a type of X-ray to measure the amount of calcium and other minerals in a person's bones. It can measure bone density in the hip and the spine. The test is similar to having a regular X-ray. This test may also be called: ?Bone densitometry. ?Bone mineral density test. ?Dual-energy X-ray absorptiometry (DEXA). ?You may have this test to: ?Diagnose a condition that causes weak or thin bones (osteoporosis). ?Screen you for osteoporosis. ?Predict your risk for a broken bone (fracture). ?Determine how well your osteoporosis  treatment is working. ?Tell a health care provider about: ?Any allergies you have. ?All medicines you are taking, including vitamins, herbs, eye drops, creams, and over-the-counter medicines. ?Any problems you or family members have had with anesthetic medicines. ?Any blood disorders you have. ?Any surgeries you have had. ?Any medical conditions you have. ?Whether you are pregnant or may be pregnant. ?Any medical tests you have had within the past 14 days that used contrast material. ?What are the risks? ?Generally, this is a safe test. However, it does expose you to a small amount of radiation, which can slightly increase your cancer risk. ?What happens before the test? ?Do not take any calcium supplements within the 24 hours before your test. ?You will need to remove all metal jewelry, eyeglasses, removable dental appliances, and any other metal objects on your body. ?What happens during the test? ? ?You will lie down on an exam table. There will be an X-ray generator below you and an imaging device above you. ?Other devices, such as boxes or braces, may be used to position your body properly for the scan. ?The machine will slowly scan your body. You will need to keep very still while the machine does the scan. ?The images will show up on a screen in the room. Images will be examined by a specialist after your test is finished. ?The procedure may vary among health care providers and hospitals. ?What can I expect after the test? ?It is up to  you to get the results of your test. Ask your health care provider, or the department that is doing the test, when your results will be ready. ?Summary ?A bone density test is an imaging test that uses a type of X-ray to measure the amount of calcium and other minerals in your bones. ?The test may be used to diagnose or screen you for a condition that causes weak or thin bones (osteoporosis), predict your risk for a broken bone (fracture), or determine how well your  osteoporosis treatment is working. ?Do not take any calcium supplements within 24 hours before your test. ?Ask your health care provider, or the department that is doing the test, when your results will be ready. ?T

## 2021-07-13 LAB — HEMOGLOBIN A1C
Est. average glucose Bld gHb Est-mCnc: 126 mg/dL
Hgb A1c MFr Bld: 6 % — ABNORMAL HIGH (ref 4.8–5.6)

## 2021-07-13 LAB — CBC WITH DIFFERENTIAL/PLATELET
Basophils Absolute: 0 10*3/uL (ref 0.0–0.2)
Basos: 1 %
EOS (ABSOLUTE): 0 10*3/uL (ref 0.0–0.4)
Eos: 1 %
Hematocrit: 43.1 % (ref 34.0–46.6)
Hemoglobin: 14 g/dL (ref 11.1–15.9)
Immature Grans (Abs): 0 10*3/uL (ref 0.0–0.1)
Immature Granulocytes: 0 %
Lymphocytes Absolute: 1 10*3/uL (ref 0.7–3.1)
Lymphs: 23 %
MCH: 31.1 pg (ref 26.6–33.0)
MCHC: 32.5 g/dL (ref 31.5–35.7)
MCV: 96 fL (ref 79–97)
Monocytes Absolute: 0.5 10*3/uL (ref 0.1–0.9)
Monocytes: 10 %
Neutrophils Absolute: 2.9 10*3/uL (ref 1.4–7.0)
Neutrophils: 65 %
Platelets: 196 10*3/uL (ref 150–450)
RBC: 4.5 x10E6/uL (ref 3.77–5.28)
RDW: 12 % (ref 11.7–15.4)
WBC: 4.5 10*3/uL (ref 3.4–10.8)

## 2021-07-13 LAB — COMPREHENSIVE METABOLIC PANEL
ALT: 17 IU/L (ref 0–32)
AST: 21 IU/L (ref 0–40)
Albumin/Globulin Ratio: 2.5 — ABNORMAL HIGH (ref 1.2–2.2)
Albumin: 4.8 g/dL (ref 3.8–4.8)
Alkaline Phosphatase: 51 IU/L (ref 44–121)
BUN/Creatinine Ratio: 14 (ref 12–28)
BUN: 13 mg/dL (ref 8–27)
Bilirubin Total: 0.2 mg/dL (ref 0.0–1.2)
CO2: 25 mmol/L (ref 20–29)
Calcium: 9.4 mg/dL (ref 8.7–10.3)
Chloride: 103 mmol/L (ref 96–106)
Creatinine, Ser: 0.95 mg/dL (ref 0.57–1.00)
Globulin, Total: 1.9 g/dL (ref 1.5–4.5)
Glucose: 114 mg/dL — ABNORMAL HIGH (ref 70–99)
Potassium: 4.9 mmol/L (ref 3.5–5.2)
Sodium: 142 mmol/L (ref 134–144)
Total Protein: 6.7 g/dL (ref 6.0–8.5)
eGFR: 66 mL/min/{1.73_m2} (ref >59)

## 2021-07-13 LAB — LIPID PANEL
Chol/HDL Ratio: 2.6 ratio (ref 0.0–4.4)
Cholesterol, Total: 194 mg/dL (ref 100–199)
HDL: 76 mg/dL (ref >39)
LDL Chol Calc (NIH): 95 mg/dL (ref 0–99)
Triglycerides: 131 mg/dL (ref 0–149)
VLDL Cholesterol Cal: 23 mg/dL (ref 5–40)

## 2021-07-17 ENCOUNTER — Encounter (HOSPITAL_BASED_OUTPATIENT_CLINIC_OR_DEPARTMENT_OTHER): Payer: Self-pay | Admitting: Nurse Practitioner

## 2021-07-18 NOTE — Progress Notes (Addendum)
Hi Andrea Paul,  ? ?I have had a chance to review your labs this morning.  ? ?Your metabolic panel shows that your blood sugar was a little high. Looking further at the hemoglobin A1c, it shows that your blood sugars are running higher on average.  ?Hemoglobin A1c is an average of your blood sugars over the past 3 months. When this levels is above 5.6%, it tells [place] that your body may not be able to process carbohydrates effectively. This can lead to your body holding onto sugar, rather than using it for energy for your cells.  ?Your range is in "pre-diabetes", which means that the levels are elevated, but not severe. It looks like your average blood sugars are running about 126. We would like them to be less than 110 on average.  ? ?I recommend monitoring your carbohydrate intake and working to keep your total daily intake to less than 180 grams. I also recommend walking at least 20-30 minutes a day to burn off excess glucose in the blood.  ?I also feel that we may need to consider adding a medication called Metformin to help control the blood sugars. This medication helps to make your cells more sensitive to insulin, which allows the sugar to enter the cell.  ?Typically we start with diet and exercise alone to work on weight loss, which can help, but your body mass is low and I don't feel that this would be effective for you.  ? ?If you are OK adding the medication, please let me know and I will send that into the pharmacy for you. We typically start with a very low dose once a day and we can increase if needed.  ? ?Your white blood cells and red blood cells look good. No signs of anemia or infection.  ?Your cholesterol levels look good. I am not concerned with any of the findings present. ?Your electrolytes are well balanced.  ?Your kidney and liver function are normal.  ? ?Please let me know if you have any concerns and let me know your thoughts about the medication. ?I can put information on carbohydrate counting for  you at the front desk to pick up at your convenience if you would like.  ? ?SaraBeth  ? ?

## 2021-07-25 ENCOUNTER — Encounter (HOSPITAL_BASED_OUTPATIENT_CLINIC_OR_DEPARTMENT_OTHER): Payer: Self-pay | Admitting: Nurse Practitioner

## 2021-07-25 ENCOUNTER — Other Ambulatory Visit (HOSPITAL_BASED_OUTPATIENT_CLINIC_OR_DEPARTMENT_OTHER): Payer: Self-pay

## 2021-07-25 DIAGNOSIS — G8929 Other chronic pain: Secondary | ICD-10-CM

## 2021-07-25 NOTE — Progress Notes (Signed)
Ef to  ?

## 2021-07-28 ENCOUNTER — Ambulatory Visit (INDEPENDENT_AMBULATORY_CARE_PROVIDER_SITE_OTHER): Payer: Medicare Other

## 2021-07-28 ENCOUNTER — Ambulatory Visit (INDEPENDENT_AMBULATORY_CARE_PROVIDER_SITE_OTHER): Payer: Medicare Other | Admitting: Orthopaedic Surgery

## 2021-07-28 DIAGNOSIS — G8929 Other chronic pain: Secondary | ICD-10-CM | POA: Diagnosis not present

## 2021-07-28 DIAGNOSIS — M25562 Pain in left knee: Secondary | ICD-10-CM

## 2021-07-28 DIAGNOSIS — M170 Bilateral primary osteoarthritis of knee: Secondary | ICD-10-CM

## 2021-07-28 DIAGNOSIS — M25561 Pain in right knee: Secondary | ICD-10-CM

## 2021-07-28 NOTE — Progress Notes (Signed)
? ?                            ? ? ?Chief Complaint: Bilateral knee pain ?  ? ? ?History of Present Illness:  ? ? ?Andrea Paul is a 66 y.o. female presents with bilateral knee pain which has been worse over the course last month.  She states that is been significantly bad with going up and down stairs.  She does have a maternal history of rheumatoid arthritis.  She is his pain is deep behind the kneecap.  Denies any buckling.  She has been using Mobic although has not been able to use this due to kidney issues. ? ? ? ?Surgical History:   ?None ? ?PMH/PSH/Family History/Social History/Meds/Allergies:   ? ?Past Medical History:  ?Diagnosis Date  ? Anemia   ? history of  ? Anxiety   ? does not take daily unless needed  ? Arthritis   ? bilateral hands  ? Basal cell carcinoma   ? Bipolar 1 disorder ([place])   ?    ? Burning mouth syndrome   ? on Lyrica   ? Heart murmur   ? History of blood transfusion   ? age 25 years old  ? History of periodontal disease   ? Pain in gums 01/16/2013  ? Pre-diabetes   ? Sleep apnea   ? no CPAP prescribed, sleeps on sides  ? ?Past Surgical History:  ?Procedure Laterality Date  ? ANTERIOR CERVICAL DECOMP/DISCECTOMY FUSION  09/17/2011  ? Procedure: ANTERIOR CERVICAL DECOMPRESSION/DISCECTOMY FUSION 2 LEVELS;  Surgeon: Hosie Spangle, MD;  Location: [place] [facility];  Service: Neurosurgery;  Laterality: Bilateral;  Cervical five-six, six-seven anterior cervical decompression with fusion plating and bonegraft  ? BASAL CELL CARCINOMA EXCISION  2010  ? COLONOSCOPY  2014  ? DILATATION & CURETTAGE/HYSTEROSCOPY WITH MYOSURE N/A 12/19/2015  ? Procedure: [place];  Surgeon: Megan Salon, MD;  Location: [facility];  Service: Gynecology;  Laterality: N/A;  ? GANGLION CYST EXCISION    ? right wrist  ? TONSILLECTOMY    ? ?Social History  ? ?Socioeconomic History  ? Marital status: Married  ?  Spouse name: Not on file  ? Number of children: Not on file  ? Years  of education: 96  ? Highest education level: Not on file  ?Occupational History  ? Not on file  ?Tobacco Use  ? Smoking status: Never  ? Smokeless tobacco: Never  ?Vaping Use  ? Vaping Use: Never used  ?Substance and Sexual Activity  ? Alcohol use: No  ? Drug use: No  ?  Comment: Caffeine: 2 cups coffee  ? Sexual activity: Yes  ?  Partners: Male  ?  Birth control/protection: Post-menopausal  ?Other Topics Concern  ? Not on file  ?Social History Narrative  ? Not on file  ? ?Social Determinants of Health  ? ?Financial Resource Strain: Low Risk   ? Difficulty of Paying Living Expenses: Not hard at all  ?Food Insecurity: No Food Insecurity  ? Worried About Charity fundraiser in the Last Year: Never true  ? Ran Out of Food in the Last Year: Never true  ?Transportation Needs: No Transportation Needs  ? Lack of Transportation (Medical): No  ? Lack of Transportation (Non-Medical): No  ?Physical Activity: Inactive  ? Days of Exercise per Week: 0 days  ? Minutes of Exercise per Session: 0 min  ?Stress: Stress  Concern Present  ? Feeling of Stress : To some extent  ?Social Connections: Moderately Integrated  ? Frequency of Communication with Friends and Family: More than three times a week  ? Frequency of Social Gatherings with Friends and Family: More than three times a week  ? Attends Religious Services: Never  ? Active Member of Clubs or Organizations: Yes  ? Attends Archivist Meetings: More than 4 times per year  ? Marital Status: Married  ? ?Family History  ?Problem Relation Age of Onset  ? Dementia Mother 70  ? Rheum arthritis Mother   ? Alcohol abuse Father   ? Anxiety disorder Father   ? Depression Father   ? Dementia Father 51  ? Paranoid behavior Father   ? CAD Father   ? Depression Sister   ? Alcohol abuse Brother   ? Bipolar disorder Brother   ? Drug abuse Brother   ? Sexual abuse Paternal Grandfather   ? Depression Brother   ? Depression Sister   ? OCD Neg Hx   ? Schizophrenia Neg Hx   ? Thyroid disease  Neg Hx   ? ?Allergies  ?Allergen Reactions  ? Latex Itching  ? ?Current Outpatient Medications  ?Medication Sig Dispense Refill  ? Acetylcarnitine HCl (ACETYL L-CARNITINE) 500 MG CAPS Take 1 capsule by mouth daily.    ? ARIPiprazole (ABILIFY) 20 MG tablet Take 1 tablet by mouth daily.    ? B Complex-Folic Acid (Y-503 BALANCED TR PO) Take 1 tablet by mouth daily.    ? buPROPion (WELLBUTRIN XL) 150 MG 24 hr tablet Take by mouth.    ? buPROPion (WELLBUTRIN XL) 300 MG 24 hr tablet Take by mouth.    ? clonazePAM (KLONOPIN) 0.5 MG tablet Take by mouth.    ? Coenzyme Q10 (COQ10 PO) Take 1 capsule by mouth daily.    ? cyclobenzaprine (FLEXERIL) 5 MG tablet Take 1 tablet (5 mg total) by mouth 3 (three) times daily as needed for muscle spasms. May cause drowsiness. 30 tablet 1  ? escitalopram (LEXAPRO) 20 MG tablet Take 20 mg by mouth daily.    ? Green Tea, Camellia sinensis, (GREEN TEA EXTRACT) 150 MG CAPS See admin instructions.    ? lamoTRIgine (LAMICTAL) 150 MG tablet Take 2 tablets (300 mg total) by mouth daily. 60 tablet 0  ? montelukast (SINGULAIR) 10 MG tablet TAKE ONE TABLET BY MOUTH EVERY NIGHT AT BEDTIME 90 tablet 2  ? Multiple Vitamin (MULTIVITAMIN WITH MINERALS) TABS Take 1 tablet by mouth daily.    ? Omega-3 Fatty Acids (FISH OIL PO) Take 1 capsule by mouth daily.     ? Probiotic Product (PROBIOTIC DAILY PO) Take 1 tablet by mouth daily.    ? Resveratrol 100 MG CAPS 1 capsule    ? simvastatin (ZOCOR) 20 MG tablet Take 20 mg by mouth every evening.    ? VITAMIN C, CALCIUM ASCORBATE, PO Take 1 tablet by mouth daily.    ? VITAMIN D, CHOLECALCIFEROL, PO Take 1 tablet by mouth daily.     ? ?No current facility-administered medications for this visit.  ? ?No results found. ? ?Review of Systems:   ?A ROS was performed including pertinent positives and negatives as documented in the HPI. ? ?Physical Exam :   ?Constitutional: NAD and appears stated age ?Neurological: Alert and oriented ?Psych: Appropriate affect and  cooperative ?Last menstrual period 10/18/2010.  ? ?Comprehensive Musculoskeletal Exam:   ? ?  ?Musculoskeletal Exam  ?Gait Normal  ?Alignment  Normal  ? Right Left  ?Inspection Normal Normal  ?Palpation    ?Tenderness Patellofemoral Patellofemoral  ?Crepitus None None  ?Effusion None None  ?Range of Motion    ?Extension 0 0  ?Flexion 135 135  ?Strength    ?Extension 5/5 5/5  ?Flexion 5/5 5/5  ?Ligament Exam     ?Generalized Laxity No No  ?Lachman Negative Negative   ?Pivot Shift Negative Negative  ?Anterior Drawer Negative Negative  ?Valgus at 0 Negative Negative  ?Valgus at 20 Negative Negative  ?Varus at 0 0 0  ?Varus at 20   0 0  ?Posterior Drawer at 90 0 0  ?Vascular/Lymphatic Exam    ?Edema None None  ?Venous Stasis Changes No No  ?Distal Circulation Normal Normal  ?Neurologic    ?Light Touch Sensation Intact Intact  ?Special Tests:   ? ? ? ?Imaging:   ?Xray (4 views right knee, 4 views left knee): ?Mild osteoarthritis predominantly involving the patellofemoral joint ? ? ?I personally reviewed and interpreted the radiographs. ? ? ?Assessment:   ?66 y.o. female with patellofemoral osteoarthritis which has been the most symptomatic recently.  She is having pain going up and down stairs.  To this effect I would like to recommend her therapy for knee strengthening program.  I have also offered her bilateral injections although she would wish to defer this.  She is hoping to change her diet modified this to more anti-inflammatory diet.  I will see her back on an as-needed basis should she want additional injections ? ?Plan :   ? ?-Return to clinic as needed ? ? ? ? ?I personally saw and evaluated the patient, and participated in the management and treatment plan. ? ?Vanetta Mulders, MD ?Attending Physician, Orthopedic Surgery ? ?This document was dictated using Systems analyst. A reasonable attempt at proof reading has been made to minimize errors. ?

## 2021-07-31 ENCOUNTER — Encounter (HOSPITAL_BASED_OUTPATIENT_CLINIC_OR_DEPARTMENT_OTHER): Payer: Self-pay | Admitting: Physical Therapy

## 2021-07-31 ENCOUNTER — Ambulatory Visit (HOSPITAL_BASED_OUTPATIENT_CLINIC_OR_DEPARTMENT_OTHER): Payer: Medicare Other | Attending: Orthopaedic Surgery | Admitting: Physical Therapy

## 2021-07-31 DIAGNOSIS — M25561 Pain in right knee: Secondary | ICD-10-CM | POA: Insufficient documentation

## 2021-07-31 DIAGNOSIS — R2689 Other abnormalities of gait and mobility: Secondary | ICD-10-CM | POA: Diagnosis not present

## 2021-07-31 DIAGNOSIS — M25562 Pain in left knee: Secondary | ICD-10-CM | POA: Diagnosis not present

## 2021-07-31 NOTE — Therapy (Signed)
?OUTPATIENT PHYSICAL THERAPY LOWER EXTREMITY EVALUATION ? ? ?Patient Name: Andrea Paul ?MRN: [id number] ?DOB:[birth year], 66 y.o., female ?Today's Date: 08/01/2021 ? ? PT End of Session - 07/31/21 1555   ? ? Visit Number 1   ? Number of Visits 6   ? Date for PT Re-Evaluation 09/11/21   ? Authorization Type medicare AARP   ? PT Start Time 1145   ? PT Stop Time 1228   ? PT Time Calculation (min) 43 min   ? Activity Tolerance Patient tolerated treatment well   ? Behavior During Therapy [facility] for tasks assessed/performed   ? ?  ?  ? ?  ? ? ?Past Medical History:  ?Diagnosis Date  ? Anemia   ? history of  ? Anxiety   ? does not take daily unless needed  ? Arthritis   ? bilateral hands  ? Basal cell carcinoma   ? Bipolar 1 disorder ([place])   ?    ? Burning mouth syndrome   ? on Lyrica   ? Heart murmur   ? History of blood transfusion   ? age 89 years old  ? History of periodontal disease   ? Pain in gums 01/16/2013  ? Pre-diabetes   ? Sleep apnea   ? no CPAP prescribed, sleeps on sides  ? ?Past Surgical History:  ?Procedure Laterality Date  ? ANTERIOR CERVICAL DECOMP/DISCECTOMY FUSION  09/17/2011  ? Procedure: ANTERIOR CERVICAL DECOMPRESSION/DISCECTOMY FUSION 2 LEVELS;  Surgeon: Hosie Spangle, MD;  Location: [place] [facility];  Service: Neurosurgery;  Laterality: Bilateral;  Cervical five-six, six-seven anterior cervical decompression with fusion plating and bonegraft  ? BASAL CELL CARCINOMA EXCISION  2010  ? COLONOSCOPY  2014  ? DILATATION & CURETTAGE/HYSTEROSCOPY WITH MYOSURE N/A 12/19/2015  ? Procedure: [place];  Surgeon: Megan Salon, MD;  Location: [place] [facility];  Service: Gynecology;  Laterality: N/A;  ? GANGLION CYST EXCISION    ? right wrist  ? TONSILLECTOMY    ? ?Patient Active Problem List  ? Diagnosis Date Noted  ? Abnormal weight gain 07/11/2021  ? Change in bowel habit 07/11/2021  ? Flatulence, eructation and gas pain 07/11/2021  ? Gastroesophageal reflux disease  07/11/2021  ? Gastro-esophageal reflux disease with esophagitis 07/11/2021  ? Rectal bleeding 07/11/2021  ? Allergic rhinitis 02/21/2021  ? Atrophic vaginitis 02/21/2021  ? Chronic fatigue syndrome 02/21/2021  ? Hyperlipidemia 02/21/2021  ? Insomnia 02/21/2021  ? Moderate recurrent major depression ([place]) 02/21/2021  ? Obstructive sleep apnea syndrome 02/21/2021  ? Prediabetes 02/21/2021  ? Muscle spasms of neck 02/21/2021  ? Memory loss or impairment 02/13/2021  ? Bilateral hearing loss due to cerumen impaction 02/13/2021  ? Post-nasal drip 02/13/2021  ? Encounter for medical examination to establish care 02/13/2021  ? Fibroids, intramural 11/27/2015  ? Endometrial mass 11/27/2015  ? Generalized anxiety disorder 10/03/2015  ? Bipolar 2 disorder ([place]) 11/13/2013  ? Bipolar I disorder, most recent episode (or current) depressed, severe, without mention of psychotic behavior 03/25/2013  ? Pain in gums 01/16/2013  ? Gingival and periodontal disease 01/16/2013  ? ? ?PCP: Emeterio Reeve Early  ? ?REFERRING PROVIDER: Dr Vanetta Mulders  ? ?REFERRING DIAG: Bilateral Knee Pain  ? ?THERAPY DIAG:  ?Acute pain of right knee ? ?Acute pain of left knee ? ?Other abnormalities of gait and mobility ? ?ONSET DATE: 2 weeks ago had an acute onset of pain. Patient has had some pain for some time.  ? ?SUBJECTIVE:  ? ?SUBJECTIVE STATEMENT: ? ? ?  PERTINENT HISTORY: ?Neck Spasm ? ?PAIN:  ?Are you having pain? Yes: NPRS scale: 5/10 at worst  ?Pain location: Bilateral knees ?Pain description: sharp  ?Aggravating factors: bending the knees  ?Relieving factors: not putting it into painful positions  ? ?PRECAUTIONS: None ? ?WEIGHT BEARING RESTRICTIONS No ? ?FALLS:  ?Has patient fallen in last 6 months? No ? ?LIVING ENVIRONMENT: ?Stairs inside her house. Steps were the cause of the pain. Going down now hurts  ?OCCUPATION:  Not working  ?Hobbies: none  ? ?PLOF: Independent  ? ? ?PATIENT GOALS  ? ?To have less pain in her knees  ? ?OBJECTIVE:   ? ?DIAGNOSTIC FINDINGS:  ?Bilateral X-rays: medial degeneration and PF degeneration ? ?PATIENT SURVEYS:  ? ? ?COGNITION: ? Overall cognitive status: Within functional limits for tasks assessed   ?  ?SENSATION: ?WFL ? ?MUSCLE LENGTH: ? ?POSTURE:  ? ? ?PALPATION: ?No significant  ? ? ? ?MMT Right ?08/01/2021 Left ?08/01/2021  ?Hip flexion 25.8 22.5  ?Hip extension    ?Hip abduction    ?Hip adduction 25.6 18.4  ?Hip internal rotation    ?Hip external rotation    ?Knee flexion    ?Knee extension 27.8 20  ?Ankle dorsiflexion    ?Ankle plantarflexion    ?Ankle inversion    ?Ankle eversion    ? (Blank rows = not tested) ? ?ROM ? ?PROM  Right ?08/01/2021 Left ?08/01/2021  ?Hip flexion Pain with end range flexion  Pain with end range flexion   ?Hip extension    ?Hip abduction    ?Hip adduction    ?Hip internal rotation    ?Hip external rotation    ?Knee flexion    ?Knee extension    ?Ankle dorsiflexion    ?Ankle plantarflexion    ?Ankle inversion    ?Ankle eversion    ? (Blank rows = not tested) ? ?FUNCTIONAL TESTS:  ?Sit to stand: painful ?Squatting: painful  ? ?Patella grind test  ? ?Limited inferior /superior movement of the patella  ? ?GAIT: ?Bilateral valgus of the knees;  ?Right toe out greater then the left.  ? ? ?TODAY'S TREATMENT: ? ?Exercises ?- Supine Quad Set  - 1 x daily - 7 x weekly - 3 sets - 10 reps ?- Supine Bridge  - 1 x daily - 7 x weekly - 3 sets - 10 reps ?- Small Range Straight Leg Raise  - 1 x daily - 7 x weekly - 3 sets - 10 reps ?- Heel Raises with Counter Support  - 1 x daily - 7 x weekly - 3 sets - 10 reps ? ? ?PATIENT EDUCATION:  ?Education details: HEP and symptom management  ?Person educated: Patient ?Education method: Explanation, Demonstration, Tactile cues, Verbal cues, and Handouts ?Education comprehension: verbalized understanding and returned demonstration ? ? ?HOME EXERCISE PROGRAM: ?Access Code: [email or web address] ?URL: [email or web address] ?Date: 07/31/2021 ?Prepared by: Carolyne Littles ? ?Exercises ?- Supine Piriformis Stretch with Foot on Ground  - 1 x daily - 7 x weekly - 3 sets - 3 reps - 20 hold ?- Supine Bridge with Resistance Band  - 1 x daily - 7 x weekly - 3 sets - 10 reps ?- Side Stepping with Resistance at Thighs  - 1 x daily - 7 x weekly - 3 sets - 10 reps ?- Standing Single Leg Stance with Counter Support  - 1 x daily - 7 x weekly - 1 sets - 5 reps - 20 sec  hold ?- Single Leg Stance with Diagonal  Forward Reach  - 1 x daily - 7 x weekly - 3 sets - 10 reps ? ?ASSESSMENT: ? ?CLINICAL IMPRESSION: ?Patient is a 66 y.o. female who was seen today for physical therapy evaluation and treatment for bilateral knee pain. She has increased pain with increased flexion of the knee both functionally and with movement. She has limited inferior/superior patella mobility. Signs and symptoms are consistent with diagnosis of patellofemoral pain. She has flat foot and orthotics. She has weakness in both hips and quadriceps. She would benefit from skilled therapy to improve her ability to sit and stand from a low surface and to return to an exercise program.  ? ? ?OBJECTIVE IMPAIRMENTS decreased activity tolerance, decreased ROM, decreased strength, and pain.  ? ?ACTIVITY LIMITATIONS  sit to stand transfers  .  ? ?PERSONAL FACTORS 1 comorbidity: bilateral flat foot  are also affecting patient's functional outcome.  ? ? ?REHAB POTENTIAL: Excellent ? ?CLINICAL DECISION MAKING: Stable/uncomplicated ? ?EVALUATION COMPLEXITY: Low ? ? ?GOALS: ?Goals reviewed with patient? Yes ? ?SHORT TERM GOALS: Target date: 08/22/2021  (Remove Blue Hyperlink) ? ?Patient will demonstrate full knee flexion without pain  ?Baseline: ?Goal status: INITIAL ? ?2.  Patient will increase gross bilateral LE strength to 5/5  ?Baseline:  ?Goal status: INITIAL ? ?3.  Patient will be independent with basic HEP  ?Baseline:  ?Goal status: INITIAL ? ? ?LONG TERM GOALS: Target date: 09/12/2021  ? ?Patient will transfer from sit to stand  without pain from a low surface  ?Baseline:  ?Goal status: INITIAL ? ?2.  Patient will go down steps without pain  ?Baseline:  ?Goal status: INITIAL ? ?3.  Patient will return to an exercise program that doesn't effect her knees.

## 2021-08-01 ENCOUNTER — Encounter (HOSPITAL_BASED_OUTPATIENT_CLINIC_OR_DEPARTMENT_OTHER): Payer: Self-pay | Admitting: Physical Therapy

## 2021-08-08 ENCOUNTER — Ambulatory Visit (HOSPITAL_BASED_OUTPATIENT_CLINIC_OR_DEPARTMENT_OTHER): Payer: Medicare Other | Admitting: Physical Therapy

## 2021-08-08 DIAGNOSIS — M25561 Pain in right knee: Secondary | ICD-10-CM

## 2021-08-08 DIAGNOSIS — M25562 Pain in left knee: Secondary | ICD-10-CM

## 2021-08-08 DIAGNOSIS — R2689 Other abnormalities of gait and mobility: Secondary | ICD-10-CM

## 2021-08-08 NOTE — Therapy (Signed)
OUTPATIENT PHYSICAL THERAPY TREATMENT NOTE   Patient Name: Andrea Paul MRN: [id number] DOB:[birth year], 66 y.o., female Today's Date: 08/09/2021    END OF SESSION:   PT End of Session - 08/08/21 1151     Visit Number 2    Number of Visits 6    Date for PT Re-Evaluation 09/11/21    Authorization Type medicare AARP    PT Start Time 1115    PT Stop Time 1157    PT Time Calculation (min) 42 min    Activity Tolerance Patient tolerated treatment well    Behavior During Therapy WFL for tasks assessed/performed             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    Heart murmur    History of blood transfusion    age 84 years old   History of periodontal disease    Pain in gums 01/16/2013   Pre-diabetes    Sleep apnea    no CPAP prescribed, sleeps on sides   Past Surgical History:  Procedure Laterality Date   ANTERIOR CERVICAL DECOMP/DISCECTOMY FUSION  09/17/2011   Procedure: ANTERIOR CERVICAL DECOMPRESSION/DISCECTOMY FUSION 2 LEVELS;  Surgeon: Hosie Spangle, MD;  Location: [place] [facility];  Service: Neurosurgery;  Laterality: Bilateral;  Cervical five-six, six-seven anterior cervical decompression with fusion plating and bonegraft   BASAL CELL CARCINOMA EXCISION  2010   COLONOSCOPY  2014   DILATATION & CURETTAGE/HYSTEROSCOPY WITH MYOSURE N/A 12/19/2015   Procedure: [place];  Surgeon: Megan Salon, MD;  Location: [place] [facility];  Service: Gynecology;  Laterality: N/A;   GANGLION CYST EXCISION     right wrist   TONSILLECTOMY     Patient Active Problem List   Diagnosis Date Noted   Abnormal weight gain 07/11/2021   Change in bowel habit 07/11/2021   Flatulence, eructation and gas pain 07/11/2021   Gastroesophageal reflux disease  07/11/2021   Gastro-esophageal reflux disease with esophagitis 07/11/2021   Rectal bleeding 07/11/2021   Allergic rhinitis 02/21/2021   Atrophic vaginitis 02/21/2021   Chronic fatigue syndrome 02/21/2021   Hyperlipidemia 02/21/2021   Insomnia 02/21/2021   Moderate recurrent major depression ([place]) 02/21/2021   Obstructive sleep apnea syndrome 02/21/2021   Prediabetes 02/21/2021   Muscle spasms of neck 02/21/2021   Memory loss or impairment 02/13/2021   Bilateral hearing loss due to cerumen impaction 02/13/2021   Post-nasal drip 02/13/2021   Encounter for medical examination to establish care 02/13/2021   Fibroids, intramural 11/27/2015   Endometrial mass 11/27/2015   Generalized anxiety disorder 10/03/2015   Bipolar 2 disorder ([place]) 11/13/2013   Bipolar I disorder, most recent episode (or current) depressed, severe, without mention of psychotic behavior 03/25/2013   Pain in gums 01/16/2013   Gingival and periodontal disease 01/16/2013    REFERRING PROVIDER: Dr Vanetta Mulders    REFERRING DIAG: Bilateral Knee Pain    THERAPY DIAG:  Acute pain of right knee   Acute pain of left knee   Other abnormalities of gait and mobility   ONSET DATE: 2 weeks ago had an acute onset of pain. Patient has had some pain for some time.  SUBJECTIVE:    SUBJECTIVE STATEMENT:  Pt states she has been compliant with HEP.  Pt has had some soreness in bilat quads.  Pt denies pain currently.  Pt has pain with descending stairs, standing up from a lower chair, and sit/stand transfers.  Pt unable to perform floor transfers.    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 Yes: NPRS scale: 5/10 at worst  Pain location: Bilateral knees Pain description: sharp  Aggravating factors: bending the knees  Relieving factors: not putting it into painful positions     PRECAUTIONS: None   WEIGHT BEARING RESTRICTIONS No   FALLS:  Has patient fallen in last 6 months? No   LIVING ENVIRONMENT: Stairs inside her house. Steps were the cause of the pain. Going down now hurts  OCCUPATION:  Not working  Hobbies: none    PLOF: Independent      PATIENT GOALS    To have less pain in her knees    OBJECTIVE:    DIAGNOSTIC FINDINGS:  Bilateral X-rays: medial degeneration and PF degeneration        TODAY'S TREATMENT:   PT answered questions concerning dx, anatomy, and rationale of exercises Reviewed HEP and updated HEP.  Gave pt a HEP handout and educated pt in correct form and appropriate frequency.  Educated pt she should not have increased pain with HEP. Pt performed:  Supine quad sets x 10 reps with 5 sec hold  Supine bridge x 10 reps  Supine SLR x 10 reps bilat  S/L hip abd 2x10 reps bilat  Supine clams with RTB x10 and GTB 2x10   Heel raises with UE support 2x10 reps  Sidestepping with bilat hands on rail 2 laps  LAQ with RTB 2x10 rep  Exercises - Supine Quad Set  - 1 x daily - 7 x weekly - 3 sets - 10 reps - Supine Bridge  - 1 x daily - 7 x weekly - 3 sets - 10 reps - Small Range Straight Leg Raise  - 1 x daily - 7 x weekly - 3 sets - 10 reps - Heel Raises with Counter Support  - 1 x daily - 7 x weekly - 3 sets - 10 reps     PATIENT EDUCATION:  Education details: HEP, exercise form, rationale, and POC.  Person educated: Patient Education method: Explanation, Demonstration, Tactile cues, Verbal cues, and Handouts Education comprehension: verbalized understanding and returned demonstration     HOME EXERCISE PROGRAM: PT updated HEP today.   Access Code: [email or web address] URL: [email or web address] Date: 08/08/2021 Prepared by: Ronny Flurry  Exercises - Sidelying Hip Abduction  - 1 x daily - 5-6 x weekly - 2 sets - 10 reps - Hooklying Clamshell with Resistance  - 1 x daily - 3-4 x weekly - 2-3 sets - 10 reps - Seated Knee  Extension with Resistance  - 1 x daily - 3 x weekly - 2-3 sets - 10 reps   ASSESSMENT:   CLINICAL IMPRESSION:  PT reviewed HEP and instructed pt in correct form and positioning.  PT updated HEP and gave pt a HEP handout.  Pt demonstrates good understanding of HEP and performed exercises well with instruction.  Pt has weakness in bilat LE's.  Pt responded well to Rx stating she felt like she had a good workout.  Pt had no pain after Rx.  She would benefit  from continued skilled therapy to improve her ability to sit and stand from a low surface, to address goals, and to assist in restoring her desired level of function.      OBJECTIVE IMPAIRMENTS decreased activity tolerance, decreased ROM, decreased strength, and pain.    ACTIVITY LIMITATIONS  sit to stand transfers  .    PERSONAL FACTORS 1 comorbidity: bilateral flat foot  are also affecting patient's functional outcome.      REHAB POTENTIAL: Excellent   CLINICAL DECISION MAKING: Stable/uncomplicated   EVALUATION COMPLEXITY: Low     GOALS: Goals reviewed with patient? Yes   SHORT TERM GOALS: Target date: 08/22/2021  (Remove Blue Hyperlink)   Patient will demonstrate full knee flexion without pain  Baseline: Goal status: INITIAL   2.  Patient will increase gross bilateral LE strength to 5/5  Baseline:  Goal status: INITIAL   3.  Patient will be independent with basic HEP  Baseline:  Goal status: INITIAL     LONG TERM GOALS: Target date: 09/12/2021    Patient will transfer from sit to stand without pain from a low surface  Baseline:  Goal status: INITIAL   2.  Patient will go down steps without pain  Baseline:  Goal status: INITIAL   3.  Patient will return to an exercise program that doesn't effect her knees.  Baseline:  Goal status: INITIAL   PLAN: PT FREQUENCY: 1-2x/week   PT DURATION: 6 weeks   PLANNED INTERVENTIONS: Therapeutic exercises, Therapeutic activity, Neuromuscular re-education, Balance training,  Gait training, Patient/Family education, Joint mobilization, Stair training, Aquatic Therapy, Dry Needling, Electrical stimulation, Cryotherapy, Moist heat, Taping, Ultrasound, and Manual therapy   PLAN FOR NEXT SESSION: continue with patella mobilization, Consider standing low march working on technique; review HEP; consider stair training when able; add thomas stretch and hamstring stretch     Selinda Michaels III PT, DPT 08/09/21 9:51 AM

## 2021-08-09 ENCOUNTER — Encounter (HOSPITAL_BASED_OUTPATIENT_CLINIC_OR_DEPARTMENT_OTHER): Payer: Self-pay | Admitting: Physical Therapy

## 2021-08-16 ENCOUNTER — Ambulatory Visit (HOSPITAL_BASED_OUTPATIENT_CLINIC_OR_DEPARTMENT_OTHER): Payer: Medicare Other | Admitting: Physical Therapy

## 2021-08-16 ENCOUNTER — Encounter (HOSPITAL_BASED_OUTPATIENT_CLINIC_OR_DEPARTMENT_OTHER): Payer: Self-pay | Admitting: Physical Therapy

## 2021-08-16 DIAGNOSIS — R2689 Other abnormalities of gait and mobility: Secondary | ICD-10-CM

## 2021-08-16 DIAGNOSIS — M25561 Pain in right knee: Secondary | ICD-10-CM | POA: Diagnosis not present

## 2021-08-16 DIAGNOSIS — M25562 Pain in left knee: Secondary | ICD-10-CM

## 2021-08-16 NOTE — Therapy (Signed)
OUTPATIENT PHYSICAL THERAPY TREATMENT NOTE   Patient Name: Andrea Paul MRN: [id number] DOB:04-Jun-1955, 66 y.o., female Today's Date: 08/16/2021    END OF SESSION:   PT End of Session - 08/16/21 1115     Visit Number 3    Number of Visits 6    Date for PT Re-Evaluation 09/11/21    Authorization Type medicare [employer]    PT Start Time 1102    PT Stop Time 1146    PT Time Calculation (min) 44 min    Activity Tolerance Patient tolerated treatment well    Behavior During Therapy WFL for tasks assessed/performed              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    Heart murmur    History of blood transfusion    age 39 years old   History of periodontal disease    Pain in gums 01/16/2013   Pre-diabetes    Sleep apnea    no CPAP prescribed, sleeps on sides   Past Surgical History:  Procedure Laterality Date   ANTERIOR CERVICAL DECOMP/DISCECTOMY FUSION  09/17/2011   Procedure: ANTERIOR CERVICAL DECOMPRESSION/DISCECTOMY FUSION 2 LEVELS;  Surgeon: Hosie Spangle, MD;  Location: [facility];  Service: Neurosurgery;  Laterality: Bilateral;  Cervical five-six, six-seven anterior cervical decompression with fusion plating and bonegraft   BASAL CELL CARCINOMA EXCISION  2010   COLONOSCOPY  2014   DILATATION & CURETTAGE/HYSTEROSCOPY WITH MYOSURE N/A 12/19/2015   Procedure: [place];  Surgeon: Megan Salon, MD;  Location: [facility];  Service: Gynecology;  Laterality: N/A;   GANGLION CYST EXCISION     right wrist   TONSILLECTOMY     Patient Active Problem List   Diagnosis Date Noted   Abnormal weight gain 07/11/2021   Change in bowel habit 07/11/2021   Flatulence, eructation and gas pain 07/11/2021   Gastroesophageal reflux  disease 07/11/2021   Gastro-esophageal reflux disease with esophagitis 07/11/2021   Rectal bleeding 07/11/2021   Allergic rhinitis 02/21/2021   Atrophic vaginitis 02/21/2021   Chronic fatigue syndrome 02/21/2021   Hyperlipidemia 02/21/2021   Insomnia 02/21/2021   Moderate recurrent major depression ([place]) 02/21/2021   Obstructive sleep apnea syndrome 02/21/2021   Prediabetes 02/21/2021   Muscle spasms of neck 02/21/2021   Memory loss or impairment 02/13/2021   Bilateral hearing loss due to cerumen impaction 02/13/2021   Post-nasal drip 02/13/2021   Encounter for medical examination to establish care 02/13/2021   Fibroids, intramural 11/27/2015   Endometrial mass 11/27/2015   Generalized anxiety disorder 10/03/2015   Bipolar 2 disorder ([place]) 11/13/2013   Bipolar I disorder, most recent episode (or current) depressed, severe, without mention of psychotic behavior 03/25/2013   Pain in gums 01/16/2013   Gingival and periodontal disease 01/16/2013    REFERRING PROVIDER: Dr Vanetta Mulders    REFERRING DIAG: Bilateral Knee Pain    THERAPY DIAG:  Acute pain of right knee   Acute pain of left knee   Other abnormalities of gait and mobility   ONSET DATE: 2 weeks ago had an acute onset of pain. Patient has had some pain for some time.  SUBJECTIVE:    SUBJECTIVE STATEMENT:  Pt reports compliance with HEP.  Pt denies any adverse effects after prior Rx.  Pt denies pain currently at rest.  Pt has pain with descending stairs, standing up from a lower chair, and squatting.  Pt unable to perform floor transfers.   Pt reports her legs feel stronger.  Pt reports reduced pain with car transfers.  "I've only had 2 sessions and I feel that I'm doing pretty good".     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 none at rest currently though has pain with  squatting and descending stairs. Pain location: Bilateral knees Pain description: sharp  Aggravating factors: bending the knees  Relieving factors: not putting it into painful positions    PRECAUTIONS: None   WEIGHT BEARING RESTRICTIONS No   FALLS:  Has patient fallen in last 6 months? No   LIVING ENVIRONMENT: Stairs inside her house. Steps were the cause of the pain. Going down now hurts  OCCUPATION:  Not working  Hobbies: none    PLOF: Independent      PATIENT GOALS    To have less pain in her knees    OBJECTIVE:    DIAGNOSTIC FINDINGS:  Bilateral X-rays: medial degeneration and PF degeneration        TODAY'S TREATMENT:   PT answered questions concerning dx, anatomy, and rationale of exercises Reviewed HEP and updated HEP.  Gave pt a HEP handout and educated pt in correct form and appropriate frequency.  Educated pt she should not have increased pain with HEP. Pt performed:  Supine bridge 2 x 10 reps  Supine SLR x 10 reps bilat  S/L hip abd 2x10 reps bilat  Supine clams with GTB x10 and BTB x10 reps   Sidestepping with bilat hands on rail 3 laps  LAQ with GTB 2x10 rep  Supine HS stretch manually 1 rep and with strap 2 reps of 20-30 sec hold SLS 3x10 sec with bilat UE support Step up on 4 inch step 2x10 reps    PATIENT EDUCATION:  Education details: HEP, exercise form, rationale, and POC.  PT answered pt's questions. Person educated: Patient Education method: Explanation, Demonstration, Tactile cues, Verbal cues, and Handouts Education comprehension: verbalized understanding and returned demonstration     HOME EXERCISE PROGRAM: PT updated HEP today.   Access Code: [email or web address] URL: [email or web address] Date: 08/16/2021 Prepared by: Ronny Flurry  - Side Stepping with Counter Support  - 1 x daily - 5 x weekly - 2 sets - 10 reps - Supine Hamstring Stretch with Strap  - 2 x daily - 7 x weekly - 2 reps - 20-30 seconds hold   ASSESSMENT:    CLINICAL IMPRESSION: Pt reports improved pain with car transfers and is pleased with progress so far.  PT reviewed HEP and progressed exercises and HEP today.  Pt performed exercises well with cuing and instruction for correct form and positioning.  Pt is compliant with HEP.  PT updated HEP and gave pt a HEP handout.  Pt responded well to Rx reporting no increased pain after Rx.  She would benefit from continued skilled therapy to address impairments and goals and to  assist in restoring her desired level of function.      OBJECTIVE IMPAIRMENTS decreased activity tolerance, decreased ROM, decreased strength, and pain.    ACTIVITY LIMITATIONS  sit to stand transfers  .  PERSONAL FACTORS 1 comorbidity: bilateral flat foot  are also affecting patient's functional outcome.      REHAB POTENTIAL: Excellent   CLINICAL DECISION MAKING: Stable/uncomplicated   EVALUATION COMPLEXITY: Low     GOALS: Goals reviewed with patient? Yes   SHORT TERM GOALS: Target date: 08/22/2021  (Remove Blue Hyperlink)   Patient will demonstrate full knee flexion without pain  Baseline: Goal status: INITIAL   2.  Patient will increase gross bilateral LE strength to 5/5  Baseline:  Goal status: INITIAL   3.  Patient will be independent with basic HEP  Baseline:  Goal status: INITIAL     LONG TERM GOALS: Target date: 09/12/2021    Patient will transfer from sit to stand without pain from a low surface  Baseline:  Goal status: INITIAL   2.  Patient will go down steps without pain  Baseline:  Goal status: INITIAL   3.  Patient will return to an exercise program that doesn't effect her knees.  Baseline:  Goal status: INITIAL   PLAN: PT FREQUENCY: 1-2x/week   PT DURATION: 6 weeks   PLANNED INTERVENTIONS: Therapeutic exercises, Therapeutic activity, Neuromuscular re-education, Balance training, Gait training, Patient/Family education, Joint mobilization, Stair training, Aquatic Therapy, Dry  Needling, Electrical stimulation, Cryotherapy, Moist heat, Taping, Ultrasound, and Manual therapy   PLAN FOR NEXT SESSION: continue with patella mobilization, Consider standing low march working on technique; review HEP; consider stair training when able; add thomas stretch.  Cont with progressing bilat hip and quad strength.      Selinda Michaels III PT, DPT 08/16/21 10:34 PM

## 2021-08-21 ENCOUNTER — Ambulatory Visit (HOSPITAL_BASED_OUTPATIENT_CLINIC_OR_DEPARTMENT_OTHER): Payer: Medicare Other | Attending: Orthopaedic Surgery | Admitting: Physical Therapy

## 2021-08-21 DIAGNOSIS — M25562 Pain in left knee: Secondary | ICD-10-CM

## 2021-08-21 DIAGNOSIS — R2689 Other abnormalities of gait and mobility: Secondary | ICD-10-CM

## 2021-08-21 DIAGNOSIS — G8929 Other chronic pain: Secondary | ICD-10-CM | POA: Insufficient documentation

## 2021-08-21 DIAGNOSIS — M25561 Pain in right knee: Secondary | ICD-10-CM

## 2021-08-21 NOTE — Therapy (Unsigned)
OUTPATIENT PHYSICAL THERAPY TREATMENT NOTE   Patient Name: Andrea Paul MRN: [id number] DOB:17-Nov-1955, 66 y.o., female Today's Date: 08/22/2021    END OF SESSION:   PT End of Session - 08/22/21 0826     Visit Number 4    Number of Visits 6    Date for PT Re-Evaluation 09/11/21    Authorization Type medicare A[employer]    PT Start Time 1147    PT Stop Time 1228    PT Time Calculation (min) 41 min    Activity Tolerance Patient tolerated treatment well    Behavior During Therapy WFL for tasks assessed/performed               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    Heart murmur    History of blood transfusion    age 67 years old   History of periodontal disease    Pain in gums 01/16/2013   Pre-diabetes    Sleep apnea    no CPAP prescribed, sleeps on sides   Past Surgical History:  Procedure Laterality Date   ANTERIOR CERVICAL DECOMP/DISCECTOMY FUSION  09/17/2011   Procedure: ANTERIOR CERVICAL DECOMPRESSION/DISCECTOMY FUSION 2 LEVELS;  Surgeon: Hosie Spangle, MD;  Location: [facility];  Service: Neurosurgery;  Laterality: Bilateral;  Cervical five-six, six-seven anterior cervical decompression with fusion plating and bonegraft   BASAL CELL CARCINOMA EXCISION  2010   COLONOSCOPY  2014   DILATATION & CURETTAGE/HYSTEROSCOPY WITH MYOSURE N/A 12/19/2015   Procedure: [place];  Surgeon: Megan Salon, MD;  Location: [facility];  Service: Gynecology;  Laterality: N/A;   GANGLION CYST EXCISION     right wrist   TONSILLECTOMY     Patient Active Problem List   Diagnosis Date Noted   Abnormal weight gain 07/11/2021   Change in bowel habit 07/11/2021   Flatulence, eructation and gas pain 07/11/2021   Gastroesophageal reflux  disease 07/11/2021   Gastro-esophageal reflux disease with esophagitis 07/11/2021   Rectal bleeding 07/11/2021   Allergic rhinitis 02/21/2021   Atrophic vaginitis 02/21/2021   Chronic fatigue syndrome 02/21/2021   Hyperlipidemia 02/21/2021   Insomnia 02/21/2021   Moderate recurrent major depression ([place]) 02/21/2021   Obstructive sleep apnea syndrome 02/21/2021   Prediabetes 02/21/2021   Muscle spasms of neck 02/21/2021   Memory loss or impairment 02/13/2021   Bilateral hearing loss due to cerumen impaction 02/13/2021   Post-nasal drip 02/13/2021   Encounter for medical examination to establish care 02/13/2021   Fibroids, intramural 11/27/2015   Endometrial mass 11/27/2015   Generalized anxiety disorder 10/03/2015   Bipolar 2 disorder ([place]) 11/13/2013   Bipolar I disorder, most recent episode (or current) depressed, severe, without mention of psychotic behavior 03/25/2013   Pain in gums 01/16/2013   Gingival and periodontal disease 01/16/2013     REFERRING PROVIDER: Dr Vanetta Mulders    REFERRING DIAG: Bilateral Knee Pain    THERAPY DIAG:  Acute pain of right knee   Acute pain of left knee   Other abnormalities of gait and mobility   ONSET DATE: 2 weeks ago had an acute onset of pain. Patient has had some pain for some time.  SUBJECTIVE:    SUBJECTIVE STATEMENT: Pt reports her pain is overall decreased with ADLs - able to walk and ascend stairs with no pain. C/c is pain with deeper squat/seats and descending stairs. Pt is highly motivated to develop and maintain an exercise program on her own to maintain fitness.   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 none at rest currently though has pain with squatting and descending stairs. Pain location: Bilateral knees Pain description: sharp  Aggravating factors: bending the knees   Relieving factors: not putting it into painful positions    PRECAUTIONS: None   WEIGHT BEARING RESTRICTIONS No   FALLS:  Has patient fallen in last 6 months? No   LIVING ENVIRONMENT: Stairs inside her house. Steps were the cause of the pain. Going down now hurts  OCCUPATION:  Not working  Hobbies: none    PLOF: Independent      PATIENT GOALS    To have less pain in her knees    OBJECTIVE:    DIAGNOSTIC FINDINGS:  Bilateral X-rays: medial degeneration and PF degeneration        TODAY'S TREATMENT:   6/5 SLR 2x10 1lb Bridging w/3sec hold 2x10 Sit-to-stand at varying heights x10 22 inches caused pain progress to 18.5  3-way hip set 2x8 Step-down 2in 2x12 Step-up 6in 2x10       5/31 PT answered questions concerning dx, anatomy, and rationale of exercises Reviewed HEP and updated HEP.  Gave pt a HEP handout and educated pt in correct form and appropriate frequency.  Educated pt she should not have increased pain with HEP. Pt performed:            Supine bridge 2 x 10 reps            Supine SLR x 10 reps bilat            S/L hip abd 2x10 reps bilat            Supine clams with GTB x10 and BTB x10 reps             Sidestepping with bilat hands on rail 3 laps            LAQ with GTB 2x10 rep            Supine HS stretch manually 1 rep and with strap 2 reps of 20-30 sec hold SLS 3x10 sec with bilat UE support Step up on 4 inch step 2x10 reps     PATIENT EDUCATION:  Education details: HEP, exercise form, rationale, and POC.  PT answered pt's questions. Person educated: Patient Education method: Explanation, Demonstration, Tactile cues, Verbal cues, and Handouts Education comprehension: verbalized understanding and returned demonstration     HOME EXERCISE PROGRAM: PT updated HEP today.    Access Code: [email or web address] URL: [email or web address] Date: 08/16/2021 Prepared by: Ronny Flurry     ASSESSMENT:   CLINICAL IMPRESSION: Pt has noticeably  less pain and increased function around the home. Able to add resistance to strength exercise, increase step-up height, and perform step-downs with minimal increase in pain. Using sit-to-stand assessment at varying heights, pt has pain with sitting to surfaces 22 in lower; provided education on pain and symptom monitoring and new exercises in HEP. Pt will continue to benefit from skilled PT to improve pain and function with ADLs.       OBJECTIVE  IMPAIRMENTS decreased activity tolerance, decreased ROM, decreased strength, and pain.    ACTIVITY LIMITATIONS  sit to stand transfers  .    PERSONAL FACTORS 1 comorbidity: bilateral flat foot  are also affecting patient's functional outcome.    REHAB POTENTIAL: Excellent   CLINICAL DECISION MAKING: Stable/uncomplicated   EVALUATION COMPLEXITY: Low     GOALS: Goals reviewed with patient? Yes   SHORT TERM GOALS: Target date: 08/22/2021     Patient will demonstrate full knee flexion without pain  Baseline: Goal status: INITIAL   2.  Patient will increase gross bilateral LE strength to 5/5  Baseline:  Goal status: INITIAL   3.  Patient will be independent with basic HEP  Baseline:  Goal status: INITIAL     LONG TERM GOALS: Target date: 09/12/2021    Patient will transfer from sit to stand without pain from a low surface  Baseline:  Goal status: INITIAL   2.  Patient will go down steps without pain  Baseline:  Goal status: INITIAL   3.  Patient will return to an exercise program that doesn't effect her knees.  Baseline:  Goal status: INITIAL   PLAN: PT FREQUENCY: 1-2x/week   PT DURATION: 6 weeks   PLANNED INTERVENTIONS: Therapeutic exercises, Therapeutic activity, Neuromuscular re-education, Balance training, Gait training, Patient/Family education, Joint mobilization, Stair training, Aquatic Therapy, Dry Needling, Electrical stimulation, Cryotherapy, Moist heat, Taping, Ultrasound, and Manual therapy   PLAN FOR NEXT SESSION:  Continue with LE strength progression, advance stability exercise, progress step/stair program as indicated.     Lenell Antu, SPT 08/22/2021 10:46 AM   During this treatment session, the therapist was present, participating in and directing the treatment.    Selinda Michaels III PT, DPT 08/22/21 10:53 AM

## 2021-08-22 ENCOUNTER — Encounter (HOSPITAL_BASED_OUTPATIENT_CLINIC_OR_DEPARTMENT_OTHER): Payer: Self-pay | Admitting: Physical Therapy

## 2021-08-25 ENCOUNTER — Encounter (HOSPITAL_BASED_OUTPATIENT_CLINIC_OR_DEPARTMENT_OTHER): Payer: Medicare Other | Admitting: Physical Therapy

## 2021-08-28 ENCOUNTER — Encounter (HOSPITAL_BASED_OUTPATIENT_CLINIC_OR_DEPARTMENT_OTHER): Payer: Self-pay | Admitting: Physical Therapy

## 2021-08-31 ENCOUNTER — Ambulatory Visit (HOSPITAL_BASED_OUTPATIENT_CLINIC_OR_DEPARTMENT_OTHER): Payer: Medicare Other | Admitting: Physical Therapy

## 2021-08-31 ENCOUNTER — Encounter (HOSPITAL_BASED_OUTPATIENT_CLINIC_OR_DEPARTMENT_OTHER): Payer: Self-pay | Admitting: Physical Therapy

## 2021-08-31 DIAGNOSIS — M25561 Pain in right knee: Secondary | ICD-10-CM | POA: Diagnosis not present

## 2021-08-31 DIAGNOSIS — M25562 Pain in left knee: Secondary | ICD-10-CM

## 2021-08-31 DIAGNOSIS — R2689 Other abnormalities of gait and mobility: Secondary | ICD-10-CM

## 2021-08-31 NOTE — Therapy (Signed)
OUTPATIENT PHYSICAL THERAPY TREATMENT NOTE   Patient Name: Andrea Paul MRN: [id number] DOB:[birth year], 66 y.o., female Today's Date: 08/31/2021    END OF SESSION:   PT End of Session - 08/31/21 1105     Visit Number 5    Number of Visits 6    Date for PT Re-Evaluation 09/11/21    Authorization Type medicare AARP    PT Start Time 0930    PT Stop Time 1012    PT Time Calculation (min) 42 min    Activity Tolerance Patient tolerated treatment well    Behavior During Therapy WFL for tasks assessed/performed                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    Heart murmur    History of blood transfusion    age 43 years old   History of periodontal disease    Pain in gums 01/16/2013   Pre-diabetes    Sleep apnea    no CPAP prescribed, sleeps on sides   Past Surgical History:  Procedure Laterality Date   ANTERIOR CERVICAL DECOMP/DISCECTOMY FUSION  09/17/2011   Procedure: ANTERIOR CERVICAL DECOMPRESSION/DISCECTOMY FUSION 2 LEVELS;  Surgeon: Hosie Spangle, MD;  Location: [facility];  Service: Neurosurgery;  Laterality: Bilateral;  Cervical five-six, six-seven anterior cervical decompression with fusion plating and bonegraft   BASAL CELL CARCINOMA EXCISION  2010   COLONOSCOPY  2014   DILATATION & CURETTAGE/HYSTEROSCOPY WITH MYOSURE N/A 12/19/2015   Procedure: [place];  Surgeon: Megan Salon, MD;  Location: [facility];  Service: Gynecology;  Laterality: N/A;   GANGLION CYST EXCISION     right wrist   TONSILLECTOMY     Patient Active Problem List   Diagnosis Date Noted   Abnormal weight gain 07/11/2021   Change in bowel habit 07/11/2021   Flatulence, eructation and gas pain 07/11/2021   Gastroesophageal reflux  disease 07/11/2021   Gastro-esophageal reflux disease with esophagitis 07/11/2021   Rectal bleeding 07/11/2021   Allergic rhinitis 02/21/2021   Atrophic vaginitis 02/21/2021   Chronic fatigue syndrome 02/21/2021   Hyperlipidemia 02/21/2021   Insomnia 02/21/2021   Moderate recurrent major depression ([place]) 02/21/2021   Obstructive sleep apnea syndrome 02/21/2021   Prediabetes 02/21/2021   Muscle spasms of neck 02/21/2021   Memory loss or impairment 02/13/2021   Bilateral hearing loss due to cerumen impaction 02/13/2021   Post-nasal drip 02/13/2021   Encounter for medical examination to establish care 02/13/2021   Fibroids, intramural 11/27/2015   Endometrial mass 11/27/2015   Generalized anxiety disorder 10/03/2015   Bipolar 2 disorder ([place]) 11/13/2013   Bipolar I disorder, most recent episode (or current) depressed, severe, without mention of psychotic behavior 03/25/2013   Pain in gums 01/16/2013   Gingival and periodontal disease 01/16/2013     REFERRING PROVIDER: Dr Vanetta Mulders    REFERRING DIAG: Bilateral Knee Pain    THERAPY DIAG:  Acute pain of right knee   Acute pain of left knee   Other abnormalities of gait and mobility   ONSET DATE: 2 weeks ago had an acute onset of pain. Patient has had some pain for some  time.    SUBJECTIVE:    SUBJECTIVE STATEMENT: Pt states she has been feeling much improved - has been able to tolerate descending stairs better and has felt like her leg strength is improving. She is able to perform sit-stand easier but has noticed more joint crepitus.   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 none at rest currently though has pain with squatting and descending stairs. Pain location: Bilateral knees Pain description: sharp  Aggravating factors: bending the knees  Relieving factors: not putting it  into painful positions    PRECAUTIONS: None   WEIGHT BEARING RESTRICTIONS No   FALLS:  Has patient fallen in last 6 months? No   LIVING ENVIRONMENT: Stairs inside her house. Steps were the cause of the pain. Going down now hurts  OCCUPATION:  Not working  Hobbies: none    PLOF: Independent      PATIENT GOALS    To have less pain in her knees    OBJECTIVE:    DIAGNOSTIC FINDINGS:  Bilateral X-rays: medial degeneration and PF degeneration        TODAY'S TREATMENT:   6/15 [employer] bike warmup L1 x4mn Bridging with hip abd, 2x10 GTB SLR x12 2lb Sit-to-stand at varying heights 3x10 21in 3-way hip 2x8 - single leg stance Step down 4 inch 2x10 AirEx balance progression 1x10 each -Static stance -Semi tandem stance -Step up  -Step down  6/5 SLR 2x10 1lb Bridging w/3sec hold 2x10 Sit-to-stand at varying heights x10 22 inches caused pain progress to 18.5  3-way hip set 2x8 Step-down 2in 2x12 Step-up 6in 2x10       5/31 PT answered questions concerning dx, anatomy, and rationale of exercises Reviewed HEP and updated HEP.  Gave pt a HEP handout and educated pt in correct form and appropriate frequency.  Educated pt she should not have increased pain with HEP. Pt performed:            Supine bridge 2 x 10 reps            Supine SLR x 10 reps bilat            S/L hip abd 2x10 reps bilat            Supine clams with GTB x10 and BTB x10 reps             Sidestepping with bilat hands on rail 3 laps            LAQ with GTB 2x10 rep            Supine HS stretch manually 1 rep and with strap 2 reps of 20-30 sec hold SLS 3x10 sec with bilat UE support Step up on 4 inch step 2x10 reps     PATIENT EDUCATION:  Education details: HEP, exercise form, rationale, and POC.  PT answered pt's questions. Person educated: Patient Education method: Explanation, Demonstration, Tactile cues, Verbal cues, and Handouts Education comprehension: verbalized understanding and returned  demonstration     HOME EXERCISE PROGRAM: PT updated HEP today.    Access Code: [email or web address]: [email or web address] Date: 08/16/2021 Prepared by: TRonny Flurry    ASSESSMENT:   CLINICAL IMPRESSION: Pt has made great improvements - pain has decreased overall, pt is able to perform mobility tasks with less difficulty, and is more confident on stairs. Pt was able to perform deeper  squats and SLS activity with no increase in pain. Pt will continue to benefit from skilled PT to improve remaining deficits.We will re-assess next visit and look at her plan.       OBJECTIVE IMPAIRMENTS decreased activity tolerance, decreased ROM, decreased strength, and pain.    ACTIVITY LIMITATIONS  sit to stand transfers  .    PERSONAL FACTORS 1 comorbidity: bilateral flat foot  are also affecting patient's functional outcome.    REHAB POTENTIAL: Excellent   CLINICAL DECISION MAKING: Stable/uncomplicated   EVALUATION COMPLEXITY: Low     GOALS: Goals reviewed with patient? Yes   SHORT TERM GOALS: Target date: 08/22/2021     Patient will demonstrate full knee flexion without pain  Baseline: Goal status: INITIAL   2.  Patient will increase gross bilateral LE strength to 5/5  Baseline:  Goal status: INITIAL   3.  Patient will be independent with basic HEP  Baseline:  Goal status: INITIAL     LONG TERM GOALS: Target date: 09/12/2021    Patient will transfer from sit to stand without pain from a low surface  Baseline:  Goal status: INITIAL   2.  Patient will go down steps without pain  Baseline:  Goal status: INITIAL   3.  Patient will return to an exercise program that doesn't effect her knees.  Baseline:  Goal status: INITIAL   PLAN: PT FREQUENCY: 1-2x/week   PT DURATION: 6 weeks   PLANNED INTERVENTIONS: Therapeutic exercises, Therapeutic activity, Neuromuscular re-education, Balance training, Gait training, Patient/Family education, Joint mobilization, Stair  training, Aquatic Therapy, Dry Needling, Electrical stimulation, Cryotherapy, Moist heat, Taping, Ultrasound, and Manual therapy   PLAN FOR NEXT SESSION: Re-eval; discuss HEP and gym program, single-leg stability.     Lenell Antu, SPT 08/31/2021, 10:43 AM   During this treatment session, the therapist was present, participating in and directing the treatment.   Carney Living, PT 08/31/2021, 11:07 AM

## 2021-09-01 ENCOUNTER — Telehealth (HOSPITAL_BASED_OUTPATIENT_CLINIC_OR_DEPARTMENT_OTHER): Payer: Self-pay | Admitting: Nurse Practitioner

## 2021-09-01 ENCOUNTER — Encounter (HOSPITAL_BASED_OUTPATIENT_CLINIC_OR_DEPARTMENT_OTHER): Payer: Self-pay | Admitting: Physical Therapy

## 2021-09-01 DIAGNOSIS — E782 Mixed hyperlipidemia: Secondary | ICD-10-CM

## 2021-09-01 NOTE — Telephone Encounter (Signed)
Pt called needing refills on medication(s) simvastatin (ZOCOR) 20 MG tablet [[id number]]     Sent to this pharmacy: [facility] [id number] - [place], [place]   Pt has this many left: 7 pills left  Pt stated they have tried getting in contact with office to refill this medication.  Please advise.

## 2021-09-04 ENCOUNTER — Encounter (HOSPITAL_BASED_OUTPATIENT_CLINIC_OR_DEPARTMENT_OTHER): Payer: Self-pay | Admitting: Physical Therapy

## 2021-09-04 MED ORDER — SIMVASTATIN 20 MG PO TABS: 20.0000 mg | ORAL_TABLET | Freq: Every evening | ORAL | 3 refills | Status: DC

## 2021-09-06 ENCOUNTER — Ambulatory Visit (HOSPITAL_BASED_OUTPATIENT_CLINIC_OR_DEPARTMENT_OTHER): Payer: Medicare Other | Admitting: Physical Therapy

## 2021-09-06 DIAGNOSIS — M25561 Pain in right knee: Secondary | ICD-10-CM | POA: Diagnosis not present

## 2021-09-06 DIAGNOSIS — M25562 Pain in left knee: Secondary | ICD-10-CM

## 2021-09-06 DIAGNOSIS — R2689 Other abnormalities of gait and mobility: Secondary | ICD-10-CM

## 2021-09-06 NOTE — Therapy (Incomplete)
OUTPATIENT PHYSICAL THERAPY TREATMENT NOTE   Patient Name: Andrea Paul MRN: [id number] DOB:13-Nov-1955, 66 y.o., female Today's Date: 09/06/2021    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    Heart murmur    History of blood transfusion    age 74 years old   History of periodontal disease    Pain in gums 01/16/2013   Pre-diabetes    Sleep apnea    no CPAP prescribed, sleeps on sides   Past Surgical History:  Procedure Laterality Date   ANTERIOR CERVICAL DECOMP/DISCECTOMY FUSION  09/17/2011   Procedure: ANTERIOR CERVICAL DECOMPRESSION/DISCECTOMY FUSION 2 LEVELS;  Surgeon: Hosie Spangle, MD;  Location: [facility];  Service: Neurosurgery;  Laterality: Bilateral;  Cervical five-six, six-seven anterior cervical decompression with fusion plating and bonegraft   BASAL CELL CARCINOMA EXCISION  2010   COLONOSCOPY  2014   DILATATION & CURETTAGE/HYSTEROSCOPY WITH MYOSURE N/A 12/19/2015   Procedure: [place];  Surgeon: Megan Salon, MD;  Location: [facility];  Service: Gynecology;  Laterality: N/A;   GANGLION CYST EXCISION     right wrist   TONSILLECTOMY     Patient Active Problem List   Diagnosis Date Noted   Abnormal weight gain 07/11/2021   Change in bowel habit 07/11/2021   Flatulence, eructation and gas pain 07/11/2021   Gastroesophageal reflux disease 07/11/2021   Gastro-esophageal reflux disease with esophagitis 07/11/2021   Rectal bleeding 07/11/2021   Allergic rhinitis 02/21/2021   Atrophic vaginitis 02/21/2021   Chronic fatigue syndrome 02/21/2021   Hyperlipidemia 02/21/2021   Insomnia 02/21/2021   Moderate recurrent major depression ([place]) 02/21/2021   Obstructive sleep apnea syndrome 02/21/2021   Prediabetes 02/21/2021    Muscle spasms of neck 02/21/2021   Memory loss or impairment 02/13/2021   Bilateral hearing loss due to cerumen impaction 02/13/2021   Post-nasal drip 02/13/2021   Encounter for medical examination to establish care 02/13/2021   Fibroids, intramural 11/27/2015   Endometrial mass 11/27/2015   Generalized anxiety disorder 10/03/2015   Bipolar 2 disorder ([place]) 11/13/2013   Bipolar I disorder, most recent episode (or current) depressed, severe, without mention of psychotic behavior 03/25/2013   Pain in gums 01/16/2013   Gingival and periodontal disease 01/16/2013     REFERRING PROVIDER: Dr Vanetta Mulders    REFERRING DIAG: Bilateral Knee Pain    THERAPY DIAG:  Acute pain of right knee   Acute pain of left knee   Other abnormalities of gait and mobility   ONSET DATE: 2 weeks ago had an acute onset of pain. Patient has had some pain for some time.    SUBJECTIVE:    SUBJECTIVE STATEMENT: Pt reports she has excess soreness in the quads for about 2-3 days following prior session but no joint pain/soreness. She is happy with progress made so far and feels confident in her home program.   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 none at rest currently though has pain with squatting and descending stairs. Pain location: Bilateral knees Pain description: sharp  Aggravating factors:  bending the knees  Relieving factors: not putting it into painful positions    PRECAUTIONS: None   WEIGHT BEARING RESTRICTIONS No   FALLS:  Has patient fallen in last 6 months? No   LIVING ENVIRONMENT: Stairs inside her house. Steps were the cause of the pain. Going down now hurts  OCCUPATION:  Not working  Hobbies: none    PLOF: Independent      PATIENT GOALS    To have less pain in her knees    OBJECTIVE:    DIAGNOSTIC FINDINGS:  Bilateral X-rays:  medial degeneration and PF degeneration   Left 08/01/2021 Right 5/16 Left 5/16 Right 6/21 Left 6/21  Hip flexion 25.8 22.5 26.5 31.6  Hip extension        Hip abduction 25.6  18.4 41.1 43.4  Hip adduction      Hip internal rotation        Hip external rotation        Knee flexion        Knee extension 27.8 20 47.5 47.9  Ankle dorsiflexion        Ankle plantarflexion        Ankle inversion        Ankle eversion         (Blank rows = not tested)   ROM   PROM  Right 08/01/2021 Left 08/01/2021 Right 6/21 Left 6/21  Hip flexion Pain with end range flexion  Pain with end range flexion  WFL, painless WFL, painless  Hip extension        Hip abduction        Hip adduction        Hip internal rotation        Hip external rotation        Knee flexion        Knee extension        Ankle dorsiflexion        Ankle plantarflexion        Ankle inversion        Ankle eversion         (Blank rows = not tested)    Sit-to-stand, squatting: pain-free  Gait: Mild bil knee valgus   TODAY'S TREATMENT:   6/21  Strength testing SciFit bike warmup, L1 x69mn   Side-stepping, 5 laps green theraband   SLR, 2x12 3lb   Prone hip ext, 1x10 3lb   Stair training    -Step ups w/hip drive 22V03   -Step down 2x10, cues for hip stability    -Reciprocal ascending/descending pattern, 1 flight      6/15 SciFit bike warmup L1 x552m Bridging with hip abd, 2x10 GTB SLR x12 2lb Sit-to-stand at varying heights 3x10 21in 3-way hip 2x8 - single leg stance Step down 4 inch 2x10 AirEx balance progression 1x10 each -Static stance -Semi tandem stance -Step up  -Step down   6/5 SLR 2x10 1lb Bridging w/3sec hold 2x10 Sit-to-stand at varying heights x10 22 inches caused pain progress to 18.5  3-way hip set 2x8 Step-down 2in 2x12 Step-up 6in 2x10       5/31 PT answered questions concerning dx, anatomy, and rationale of exercises Reviewed HEP and updated HEP.  Gave pt a HEP handout and  educated pt in correct form and appropriate frequency.  Educated pt she should not have increased pain with HEP. Pt performed:            Supine bridge 2 x 10 reps  Supine SLR x 10 reps bilat            S/L hip abd 2x10 reps bilat            Supine clams with GTB x10 and BTB x10 reps             Sidestepping with bilat hands on rail 3 laps            LAQ with GTB 2x10 rep            Supine HS stretch manually 1 rep and with strap 2 reps of 20-30 sec hold SLS 3x10 sec with bilat UE support Step up on 4 inch step 2x10 reps     PATIENT EDUCATION:  Education details: HEP, exercise form, rationale, and POC.  PT answered pt's questions. Person educated: Patient Education method: Explanation, Demonstration, Tactile cues, Verbal cues, and Handouts Education comprehension: verbalized understanding and returned demonstration     HOME EXERCISE PROGRAM: PT updated HEP today.    Access Code: [email or web address] URL: [email or web address] Date: 08/16/2021 Prepared by: Ronny Flurry     ASSESSMENT:   CLINICAL IMPRESSION: Pt has made great improvements - pain has decreased overall, pt is able to perform mobility tasks with less difficulty, and is more confident on stairs. Pt was able to perform deeper squats and SLS activity with no increase in pain. Pt will continue to benefit from skilled PT to improve remaining deficits.We will re-assess next visit and look at her plan.       OBJECTIVE IMPAIRMENTS decreased activity tolerance, decreased ROM, decreased strength, and pain.    ACTIVITY LIMITATIONS  sit to stand transfers  .    PERSONAL FACTORS 1 comorbidity: bilateral flat foot  are also affecting patient's functional outcome.    REHAB POTENTIAL: Excellent   CLINICAL DECISION MAKING: Stable/uncomplicated   EVALUATION COMPLEXITY: Low     GOALS: Goals reviewed with patient? Yes   SHORT TERM GOALS: Target date: 08/22/2021     Patient will demonstrate full knee flexion  without pain  Baseline: Goal status: INITIAL   2.  Patient will increase gross bilateral LE strength to 5/5  Baseline:  Goal status: INITIAL   3.  Patient will be independent with basic HEP  Baseline:  Goal status: INITIAL     LONG TERM GOALS: Target date: 09/12/2021    Patient will transfer from sit to stand without pain from a low surface  Baseline:  Goal status: INITIAL   2.  Patient will go down steps without pain  Baseline:  Goal status: INITIAL   3.  Patient will return to an exercise program that doesn't effect her knees.  Baseline:  Goal status: INITIAL   PLAN: PT FREQUENCY: 1-2x/week   PT DURATION: 6 weeks   PLANNED INTERVENTIONS: Therapeutic exercises, Therapeutic activity, Neuromuscular re-education, Balance training, Gait training, Patient/Family education, Joint mobilization, Stair training, Aquatic Therapy, Dry Needling, Electrical stimulation, Cryotherapy, Moist heat, Taping, Ultrasound, and Manual therapy   PLAN FOR NEXT SESSION: Re-eval; discuss HEP and gym program, single-leg stability.     Lenell Antu, SPT 08/31/2021, 10:43 AM   During this treatment session, the therapist was present, participating in and directing the treatment.   Lenell Antu, Student-PT 09/06/2021, 1:07 PM

## 2021-09-07 ENCOUNTER — Encounter (HOSPITAL_BASED_OUTPATIENT_CLINIC_OR_DEPARTMENT_OTHER): Payer: Self-pay | Admitting: Physical Therapy

## 2021-09-07 NOTE — Therapy (Signed)
OUTPATIENT PHYSICAL THERAPY TREATMENT NOTE   Patient Name: Andrea Paul MRN: [id number] DOB:02-03-1956, 66 y.o., female Today's Date: 09/06/2021   PT End of Session - 09/06/21 1516     Visit Number 6    Number of Visits 10    Date for PT Re-Evaluation 10/05/21    Authorization Type medicare [employer]/ progress note done at visit 6    PT Start Time 1302    PT Stop Time 1345    PT Time Calculation (min) 43 min    Activity Tolerance Patient tolerated treatment well    Behavior During Therapy WFL for tasks assessed/performed              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    Heart murmur    History of blood transfusion    age 73 years old   History of periodontal disease    Pain in gums 01/16/2013   Pre-diabetes    Sleep apnea    no CPAP prescribed, sleeps on sides   Past Surgical History:  Procedure Laterality Date   ANTERIOR CERVICAL DECOMP/DISCECTOMY FUSION  09/17/2011   Procedure: ANTERIOR CERVICAL DECOMPRESSION/DISCECTOMY FUSION 2 LEVELS;  Surgeon: Hosie Spangle, MD;  Location: [facility];  Service: Neurosurgery;  Laterality: Bilateral;  Cervical five-six, six-seven anterior cervical decompression with fusion plating and bonegraft   BASAL CELL CARCINOMA EXCISION  2010   COLONOSCOPY  2014   DILATATION & CURETTAGE/HYSTEROSCOPY WITH MYOSURE N/A 12/19/2015   Procedure: [place];  Surgeon: Megan Salon, MD;  Location: [facility];  Service: Gynecology;  Laterality: N/A;   GANGLION CYST EXCISION     right wrist   TONSILLECTOMY     Patient Active Problem List   Diagnosis Date Noted   Abnormal weight gain 07/11/2021   Change in bowel habit 07/11/2021   Flatulence, eructation and gas pain 07/11/2021   Gastroesophageal  reflux disease 07/11/2021   Gastro-esophageal reflux disease with esophagitis 07/11/2021   Rectal bleeding 07/11/2021   Allergic rhinitis 02/21/2021   Atrophic vaginitis 02/21/2021   Chronic fatigue syndrome 02/21/2021   Hyperlipidemia 02/21/2021   Insomnia 02/21/2021   Moderate recurrent major depression ([place]) 02/21/2021   Obstructive sleep apnea syndrome 02/21/2021   Prediabetes 02/21/2021   Muscle spasms of neck 02/21/2021   Memory loss or impairment 02/13/2021   Bilateral hearing loss due to cerumen impaction 02/13/2021   Post-nasal drip 02/13/2021   Encounter for medical examination to establish care 02/13/2021   Fibroids, intramural 11/27/2015   Endometrial mass 11/27/2015   Generalized anxiety disorder 10/03/2015   Bipolar 2 disorder ([place]) 11/13/2013   Bipolar I disorder, most recent episode (or current) depressed, severe, without mention of psychotic behavior 03/25/2013   Pain in gums 01/16/2013   Gingival and periodontal disease 01/16/2013     REFERRING PROVIDER: Dr Vanetta Mulders    REFERRING DIAG: Bilateral Knee Pain    THERAPY DIAG:  Acute pain of right knee   Acute pain of left knee   Other abnormalities of gait and mobility   ONSET DATE: 2 weeks ago had an acute onset of pain. Patient has had some pain for some time.  SUBJECTIVE:    SUBJECTIVE STATEMENT: Pt reports she has excess soreness in the quads for about 2-3 days following prior session but no joint pain/soreness. She is happy with progress made so far and feels confident in her home program. She wants to try reciprocal gait on stairs. We will continue to work with the patient 1W4 to continue to progress HEP with likely discharge coming in 1-2 visits.   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 none at rest currently though has pain with squatting and descending  stairs. Pain location: Bilateral knees Pain description: sharp  Aggravating factors: bending the knees  Relieving factors: not putting it into painful positions    PRECAUTIONS: None   WEIGHT BEARING RESTRICTIONS No   FALLS:  Has patient fallen in last 6 months? No   LIVING ENVIRONMENT: Stairs inside her house. Steps were the cause of the pain. Going down now hurts  OCCUPATION:  Not working  Hobbies: none    PLOF: Independent      PATIENT GOALS    To have less pain in her knees    OBJECTIVE:    DIAGNOSTIC FINDINGS:  Bilateral X-rays: medial degeneration and PF degeneration   Left 08/01/2021 Right 5/16 Left 5/16 Right 6/21 Left 6/21  Hip flexion 25.8 22.5 26.5 31.6  Hip extension        Hip abduction 25.6  18.4 41.1 43.4  Hip adduction      Hip internal rotation        Hip external rotation        Knee flexion        Knee extension 27.8 20 47.5 47.9  Ankle dorsiflexion        Ankle plantarflexion        Ankle inversion        Ankle eversion         (Blank rows = not tested)   ROM   PROM  Right 08/01/2021 Left 08/01/2021 Right 6/21 Left 6/21  Hip flexion Pain with end range flexion  Pain with end range flexion  WFL, painless WFL, painless  Hip extension        Hip abduction        Hip adduction        Hip internal rotation        Hip external rotation        Knee flexion        Knee extension        Ankle dorsiflexion        Ankle plantarflexion        Ankle inversion        Ankle eversion         (Blank rows = not tested)    Sit-to-stand, squatting: pain-free  Gait: Mild bil knee valgus   TODAY'S TREATMENT:   6/21  Strength testing SciFit bike warmup, L1 x70mn   Side-stepping, 5 laps green theraband   SLR, 2x12 3lb   Prone hip ext, 1x10 3lb   Stair training    -Step ups w/hip drive 21O10   -Step down 2x10, cues for hip stability    -Reciprocal ascending/descending pattern, 1 flight      6/15 SciFit bike warmup L1 x560m Bridging  with hip abd, 2x10 GTB SLR x12 2lb Sit-to-stand at varying heights 3x10 21in 3-way hip 2x8 - single leg stance Step  down 4 inch 2x10 AirEx balance progression 1x10 each -Static stance -Semi tandem stance -Step up  -Step down   6/5 SLR 2x10 1lb Bridging w/3sec hold 2x10 Sit-to-stand at varying heights x10 22 inches caused pain progress to 18.5  3-way hip set 2x8 Step-down 2in 2x12 Step-up 6in 2x10       5/31 PT answered questions concerning dx, anatomy, and rationale of exercises Reviewed HEP and updated HEP.  Gave pt a HEP handout and educated pt in correct form and appropriate frequency.  Educated pt she should not have increased pain with HEP. Pt performed:            Supine bridge 2 x 10 reps            Supine SLR x 10 reps bilat            S/L hip abd 2x10 reps bilat            Supine clams with GTB x10 and BTB x10 reps             Sidestepping with bilat hands on rail 3 laps            LAQ with GTB 2x10 rep            Supine HS stretch manually 1 rep and with strap 2 reps of 20-30 sec hold SLS 3x10 sec with bilat UE support Step up on 4 inch step 2x10 reps     PATIENT EDUCATION:  Education details: HEP, exercise form, rationale, and POC.  PT answered pt's questions. Person educated: Patient Education method: Explanation, Demonstration, Tactile cues, Verbal cues, and Handouts Education comprehension: verbalized understanding and returned demonstration     HOME EXERCISE PROGRAM: PT updated HEP today.    Access Code: [email or web address] URL: [email or web address] Date: 08/16/2021 Prepared by: Ronny Flurry     ASSESSMENT:   CLINICAL IMPRESSION: Pt has made excellent improvements - her generalized LE strength is symmetrical and greatly improved. She is able to reach end range hip flexion without pain and increase resistance with hip strengthening. She also demonstrates good form with stair activities and has no difficulty or pain ascending or descending  with normalized reciprocal pattern. She will benefit from further therapy to improve general exercise knowledge and be independent with home exercise plan.     OBJECTIVE IMPAIRMENTS decreased activity tolerance, decreased ROM, decreased strength, and pain.    ACTIVITY LIMITATIONS  sit to stand transfers  .    PERSONAL FACTORS 1 comorbidity: bilateral flat foot  are also affecting patient's functional outcome.    REHAB POTENTIAL: Excellent   CLINICAL DECISION MAKING: Stable/uncomplicated   EVALUATION COMPLEXITY: Low     GOALS: Goals reviewed with patient? Yes   SHORT TERM GOALS: Target date: 08/22/2021  re-assessed 6/21   Patient will demonstrate full knee flexion without pain  Baseline:  Goal status: Achieved    2.  Patient will increase gross bilateral LE strength to 5/5  Baseline:  Goal status: Achieved    3.  Patient will be independent with basic HEP  Baseline:  Goal status: Achieved      LONG TERM GOALS: Target date: 09/12/2021    Patient will transfer from sit to stand without pain from a low surface  Baseline:  Goal status: Not having any pain this visit    2.  Patient will go down steps without pain  Baseline:  Goal status: assessed and not having pain    3.  Patient will return  to an exercise program that doesn't effect her knees.  Baseline:  Goal status: achieved    PLAN: PT FREQUENCY: 1-2x/week   PT DURATION: 6 weeks   PLANNED INTERVENTIONS: Therapeutic exercises, Therapeutic activity, Neuromuscular re-education, Balance training, Gait training, Patient/Family education, Joint mobilization, Stair training, Aquatic Therapy, Dry Needling, Electrical stimulation, Cryotherapy, Moist heat, Taping, Ultrasound, and Manual therapy   PLAN FOR NEXT SESSION: Discuss home exercise program and general exercise guidelines. Potential to discharge.     Lenell Antu, SPT 08/31/2021, 10:43 AM   During this treatment session, the therapist was present, participating  in and directing the treatment.   Carney Living, PT 09/07/2021, 10:05 AM

## 2021-09-08 ENCOUNTER — Encounter (HOSPITAL_BASED_OUTPATIENT_CLINIC_OR_DEPARTMENT_OTHER): Payer: Self-pay | Admitting: Physical Therapy

## 2021-09-11 ENCOUNTER — Encounter (HOSPITAL_BASED_OUTPATIENT_CLINIC_OR_DEPARTMENT_OTHER): Payer: Self-pay | Admitting: Physical Therapy

## 2021-09-13 ENCOUNTER — Ambulatory Visit (HOSPITAL_BASED_OUTPATIENT_CLINIC_OR_DEPARTMENT_OTHER): Payer: Medicare Other | Admitting: Physical Therapy

## 2021-09-15 ENCOUNTER — Encounter (HOSPITAL_BASED_OUTPATIENT_CLINIC_OR_DEPARTMENT_OTHER): Payer: Self-pay | Admitting: Physical Therapy

## 2021-09-27 ENCOUNTER — Encounter (HOSPITAL_BASED_OUTPATIENT_CLINIC_OR_DEPARTMENT_OTHER): Payer: Self-pay | Admitting: Physical Therapy

## 2021-09-27 ENCOUNTER — Ambulatory Visit (HOSPITAL_BASED_OUTPATIENT_CLINIC_OR_DEPARTMENT_OTHER): Payer: Medicare Other | Attending: Orthopaedic Surgery | Admitting: Physical Therapy

## 2021-09-27 DIAGNOSIS — R2689 Other abnormalities of gait and mobility: Secondary | ICD-10-CM | POA: Diagnosis present

## 2021-09-27 DIAGNOSIS — M25562 Pain in left knee: Secondary | ICD-10-CM | POA: Insufficient documentation

## 2021-09-27 DIAGNOSIS — M25561 Pain in right knee: Secondary | ICD-10-CM | POA: Diagnosis not present

## 2021-09-27 NOTE — Therapy (Signed)
OUTPATIENT PHYSICAL THERAPY DISCHARGE NOTE   Patient Name: Andrea Paul MRN: [id number] DOB:Dec 28, 1955, 66 y.o., female Today's Date: 09/06/2021   PT End of Session - 09/27/21 1020     Visit Number 7    Number of Visits 10    Date for PT Re-Evaluation 10/05/21    Authorization Type medicare [employer]/ progress note done at visit 6    PT Start Time 0932    PT Stop Time 1013    PT Time Calculation (min) 41 min    Activity Tolerance Patient tolerated treatment well    Behavior During Therapy [facility] for tasks assessed/performed               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    Heart murmur    History of blood transfusion    age 35 years old   History of periodontal disease    Pain in gums 01/16/2013   Pre-diabetes    Sleep apnea    no CPAP prescribed, sleeps on sides   Past Surgical History:  Procedure Laterality Date   ANTERIOR CERVICAL DECOMP/DISCECTOMY FUSION  09/17/2011   Procedure: ANTERIOR CERVICAL DECOMPRESSION/DISCECTOMY FUSION 2 LEVELS;  Surgeon: Hosie Spangle, MD;  Location: [facility];  Service: Neurosurgery;  Laterality: Bilateral;  Cervical five-six, six-seven anterior cervical decompression with fusion plating and bonegraft   BASAL CELL CARCINOMA EXCISION  2010   COLONOSCOPY  2014   DILATATION & CURETTAGE/HYSTEROSCOPY WITH MYOSURE N/A 12/19/2015   Procedure: [place];  Surgeon: Megan Salon, MD;  Location: [facility];  Service: Gynecology;  Laterality: N/A;   GANGLION CYST EXCISION     right wrist   TONSILLECTOMY     Patient Active Problem List   Diagnosis Date Noted   Abnormal weight gain 07/11/2021   Change in bowel habit 07/11/2021   Flatulence, eructation and gas pain 07/11/2021   Gastroesophageal  reflux disease 07/11/2021   Gastro-esophageal reflux disease with esophagitis 07/11/2021   Rectal bleeding 07/11/2021   Allergic rhinitis 02/21/2021   Atrophic vaginitis 02/21/2021   Chronic fatigue syndrome 02/21/2021   Hyperlipidemia 02/21/2021   Insomnia 02/21/2021   Moderate recurrent major depression ([place]) 02/21/2021   Obstructive sleep apnea syndrome 02/21/2021   Prediabetes 02/21/2021   Muscle spasms of neck 02/21/2021   Memory loss or impairment 02/13/2021   Bilateral hearing loss due to cerumen impaction 02/13/2021   Post-nasal drip 02/13/2021   Encounter for medical examination to establish care 02/13/2021   Fibroids, intramural 11/27/2015   Endometrial mass 11/27/2015   Generalized anxiety disorder 10/03/2015   Bipolar 2 disorder ([place]) 11/13/2013   Bipolar I disorder, most recent episode (or current) depressed, severe, without mention of psychotic behavior 03/25/2013   Pain in gums 01/16/2013   Gingival and periodontal disease 01/16/2013     REFERRING PROVIDER: Dr Vanetta Mulders    REFERRING DIAG: Bilateral Knee Pain    THERAPY DIAG:  Acute pain of right knee   Acute pain of left knee   Other abnormalities of gait and mobility   ONSET DATE: 2 weeks ago had an acute onset of pain. Patient has had some pain for some time.  SUBJECTIVE:    SUBJECTIVE STATEMENT: Pt feels she has made excellent progress with PT, she has had very little pain with a max of 2/10 NPRS since last visit. She has been consistent with her HEP and has been going up/down full flights of stairs without pain or difficulty. She is confident with her exercises and feels she would be able to manage pain and sx if they were to worsen again. She feels ready for discharge.   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 none at rest currently though has pain with  squatting and descending stairs. Pain location: Bilateral knees Pain description: sharp  Aggravating factors: bending the knees  Relieving factors: not putting it into painful positions    PRECAUTIONS: None   WEIGHT BEARING RESTRICTIONS No   FALLS:  Has patient fallen in last 6 months? No   LIVING ENVIRONMENT: Stairs inside her house. Steps were the cause of the pain. Going down now hurts  OCCUPATION:  Not working  Hobbies: none    PLOF: Independent      PATIENT GOALS    To have less pain in her knees    OBJECTIVE:    DIAGNOSTIC FINDINGS:  Bilateral X-rays: medial degeneration and PF degeneration   Left 08/01/2021 Right 5/16 Left 5/16 Right 6/21 Left 6/21  Hip flexion 25.8 22.5 26.5 31.6  Hip extension        Hip abduction 25.6  18.4 41.1 43.4  Hip adduction      Hip internal rotation        Hip external rotation        Knee flexion        Knee extension 27.8 20 47.5 47.9  Ankle dorsiflexion        Ankle plantarflexion        Ankle inversion        Ankle eversion         (Blank rows = not tested)   ROM   PROM  Right 08/01/2021 Left 08/01/2021 Right 6/21 Left 6/21  Hip flexion Pain with end range flexion  Pain with end range flexion  WFL, painless WFL, painless  Hip extension        Hip abduction        Hip adduction        Hip internal rotation        Hip external rotation        Knee flexion        Knee extension        Ankle dorsiflexion        Ankle plantarflexion        Ankle inversion        Ankle eversion         (Blank rows = not tested)    Sit-to-stand, squatting: pain-free  Gait: Mild bil knee valgus   TODAY'S TREATMENT:   7/12 Sci[employer] bike warmup, L1 x1min   SLR, 2x12 4lb   Bridging 3x8, w/ 1x march and 2x knee ext   Side-stepping w/mini squat, 4 laps   Mini squat w/abd band, x15 green tb  Reviewed progression and what to do in the future if her knee starts to hurt.    6/21 Strength testing SciFit bike warmup, L1  x65min   Side-stepping, 5 laps green theraband   SLR, 2x12 3lb   Prone hip ext, 1x10 3lb  Stair training    -Step ups w/hip drive 2D78    -Step down 2x10, cues for hip stability    -Reciprocal ascending/descending pattern, 1 flight      6/15 SciFit bike warmup L1 x40min Bridging with hip abd, 2x10 GTB SLR x12 2lb Sit-to-stand at varying heights 3x10 21in 3-way hip 2x8 - single leg stance Step down 4 inch 2x10 AirEx balance progression 1x10 each -Static stance -Semi tandem stance -Step up  -Step down      PATIENT EDUCATION:  Education details: HEP, exercise form, rationale, and POC.  PT answered pt's questions. Person educated: Patient Education method: Explanation, Demonstration, Tactile cues, Verbal cues, and Handouts Education comprehension: verbalized understanding and returned demonstration     HOME EXERCISE PROGRAM: PT updated HEP today.    Access Code: [email or web address] URL: [email or web address] Date: 08/16/2021 Prepared by: Ronny Flurry     ASSESSMENT:   CLINICAL IMPRESSION: Pt has made great improvements with skilled therapy. She is having minimal pain and has returned to prior level of function with all activities. She demonstrates good lateral hip strength and stability today. We discussed exercise progression/regression to add options to her HEP. She demonstrates good stability on stairs and with squats, with minimal knee valgus movement that she was able to fix following cues. She is fit for discharge from therapy.     OBJECTIVE IMPAIRMENTS decreased activity tolerance, decreased ROM, decreased strength, and pain.    ACTIVITY LIMITATIONS  sit to stand transfers  .    PERSONAL FACTORS 1 comorbidity: bilateral flat foot  are also affecting patient's functional outcome.    REHAB POTENTIAL: Excellent   CLINICAL DECISION MAKING: Stable/uncomplicated   EVALUATION COMPLEXITY: Low     GOALS: Goals reviewed with patient? Yes   SHORT TERM GOALS:  Target date: 08/22/2021  re-assessed 6/21   Patient will demonstrate full knee flexion without pain  Baseline:  Goal status: Achieved    2.  Patient will increase gross bilateral LE strength to 5/5  Baseline:  Goal status: Achieved    3.  Patient will be independent with basic HEP  Baseline:  Goal status: Achieved      LONG TERM GOALS: Target date: 09/12/2021    Patient will transfer from sit to stand without pain from a low surface  Baseline:  Goal status: Achieved   2.  Patient will go down steps without pain  Baseline:  Goal status: Achieved 3.  Patient will return to an exercise program that doesn't effect her knees.  Baseline:  Goal status: achieved    PLAN: PT FREQUENCY: 1-2x/week   PT DURATION: 6 weeks   PLANNED INTERVENTIONS: Therapeutic exercises, Therapeutic activity, Neuromuscular re-education, Balance training, Gait training, Patient/Family education, Joint mobilization, Stair training, Aquatic Therapy, Dry Needling, Electrical stimulation, Cryotherapy, Moist heat, Taping, Ultrasound, and Manual therapy   PLAN FOR NEXT SESSION: Discuss home exercise program and general exercise guidelines. Potential to discharge.  PHYSICAL THERAPY DISCHARGE SUMMARY  Visits from Start of Care: 7  Current functional level related to goals / functional outcomes: Significant improvement in pain and ability    Remaining deficits: Pain at times but very infrequent    Education / Equipment: HEP    Patient agrees to discharge. Patient goals were met. Patient is being discharged due to meeting the stated rehab goals.    Lenell Antu, SPT 08/31/2021, 10:43 AM   During this treatment session, the therapist was present, participating in and directing the treatment.   Carney Living, PT 09/27/2021, 12:55  PM

## 2021-12-07 ENCOUNTER — Ambulatory Visit (INDEPENDENT_AMBULATORY_CARE_PROVIDER_SITE_OTHER): Payer: Medicare Other | Admitting: Nurse Practitioner

## 2021-12-07 ENCOUNTER — Encounter (HOSPITAL_BASED_OUTPATIENT_CLINIC_OR_DEPARTMENT_OTHER): Payer: Self-pay | Admitting: Nurse Practitioner

## 2021-12-07 VITALS — BP 129/66 | HR 76 | Ht 70.0 in | Wt 169.5 lb

## 2021-12-07 DIAGNOSIS — R066 Hiccough: Secondary | ICD-10-CM

## 2021-12-07 NOTE — Patient Instructions (Signed)
Your EKG looked very good with no concerning findings, which is very reassuring. I feel that what you were experiencing is called a diaphragmatic spasm. I am not sure what caused this, but I am hopeful it was a one time occurrence and does not happen again. If it does, please let me know.

## 2021-12-07 NOTE — Assessment & Plan Note (Signed)
EKG performed in the office today with normal readings. No evidence of ischemia or ST changes noted. No ectopic beats present. Lungs clear to ausculation with normal chest expansion present. No epigastric tenderness, no tenderness to spine or costovertebral angles. HRRR to auscultation. Capillary refill brisk. No LE edema. No mass or herniation detected in the abdomen. VS WNL.  After discussion with patient of the onset and trajectory of symptoms, I do suspect that she experienced a spasm of the diaphragm.  Discussion of additional laboratory and imaging studies that could be performed addressed with patient. At this time she would like to avoid additional testing, if possible. I feel that given her presentation and examination today this is reasonable. We discussed the limited evidence for treatment options. At this time I do recommend if this reoccurs to contact the office so that we can advise on further evaluation. Recommendations for stretching and application of heat if this happens again provided. We will plan to follow-up as needed.

## 2021-12-07 NOTE — Progress Notes (Addendum)
Orma Render, DNP, AGNP-c [facility] [address]  [place], [place] [address] [phone]  Subjective:   Andrea Paul is a 66 y.o. female presents to day for evaluation of: Acute Visit (Patient presents today for pain rib cage and upper back after walking her steps 3 times yesterday.)  Chest Pain Andrea Paul presents today to discuss sudden onset of chest pain that occurred yesterday after performing at home physical therapy exercises.  She tells me that she was performing exercises walking up and down the stairs 3 times at a moderate pace/intensity.  When she stopped these exercises to rest she reports a sudden onset of pressure and pain that wrapped around her mid thoracic region from anterior to posterior.  She endorses that the symptoms were present for several minutes prior to slowly subsiding.  She reports during the episode she felt that she could not take a deep breath.  She reports that this concerned her for possible cardiac etiology. She denies exercising to the point of exhaustion, shortness of breath, fatigue, or rapid heart rate. She denies symptoms of nausea, sweating, jaw pain, neck pain, shoulder pain.  She has no history of similar episodes. No cardiac history.   PMH, Medications, and Allergies reviewed and updated in chart as appropriate.   ROS negative except for what is listed in HPI. Objective:  BP 129/66   Pulse 76   Ht '5\' 10"'$  (1.778 m)   Wt 169 lb 8 oz (76.9 kg)   LMP 10/18/2010   SpO2 99%   BMI 24.32 kg/m  Physical Exam Vitals and nursing note reviewed.  Constitutional:      Appearance: Normal appearance.  HENT:     Head: Normocephalic.  Neck:     Vascular: No carotid bruit.  Cardiovascular:     Rate and Rhythm: Normal rate and regular rhythm.     Pulses: Normal pulses.     Heart sounds: Normal heart sounds.  Pulmonary:     Effort: Pulmonary effort is normal.     Breath sounds: Normal  breath sounds. No wheezing or rhonchi.  Chest:     Chest wall: No tenderness.  Abdominal:     General: Abdomen is flat. Bowel sounds are normal. There is no distension.     Palpations: Abdomen is soft. There is no mass.     Tenderness: There is no abdominal tenderness. There is no right CVA tenderness, left CVA tenderness, guarding or rebound.     Hernia: No hernia is present.  Musculoskeletal:        General: Normal range of motion.     Cervical back: Normal range of motion.     Right lower leg: No edema.     Left lower leg: No edema.  Skin:    General: Skin is warm and dry.     Capillary Refill: Capillary refill takes less than 2 seconds.  Neurological:     General: No focal deficit present.     Mental Status: She is alert and oriented to person, place, and time.     Motor: No weakness.  Psychiatric:        Mood and Affect: Mood normal.        Behavior: Behavior normal.        Thought Content: Thought content normal.        Judgment: Judgment normal.           Assessment & Plan:   Problem List Items  Addressed This Visit     Spasm of diaphragm - Primary    EKG performed in the office today with normal readings. No evidence of ischemia or ST changes noted. No ectopic beats present. Lungs clear to ausculation with normal chest expansion present. No epigastric tenderness, no tenderness to spine or costovertebral angles. HRRR to auscultation. Capillary refill brisk. No LE edema. No mass or herniation detected in the abdomen. VS WNL.  After discussion with patient of the onset and trajectory of symptoms, I do suspect that she experienced a spasm of the diaphragm.  Discussion of additional laboratory and imaging studies that could be performed addressed with patient. At this time she would like to avoid additional testing, if possible. I feel that given her presentation and examination today this is reasonable. We discussed the limited evidence for treatment options. At this time I  do recommend if this reoccurs to contact the office so that we can advise on further evaluation. Recommendations for stretching and application of heat if this happens again provided. We will plan to follow-up as needed.          Orma Render, DNP, AGNP-c 12/07/2021  7:49 PM    History, Medications, Surgery, SDOH, and Family History reviewed and updated as appropriate.

## 2021-12-13 ENCOUNTER — Telehealth (HOSPITAL_BASED_OUTPATIENT_CLINIC_OR_DEPARTMENT_OTHER): Payer: Self-pay | Admitting: Nurse Practitioner

## 2021-12-13 NOTE — Telephone Encounter (Signed)
Patient was told to call back if she continued to have spasms in her diaphragm.  Patient had an episode last night and wanted you to be aware.  Pain was under her breast.  Please advise!

## 2021-12-14 ENCOUNTER — Other Ambulatory Visit (HOSPITAL_BASED_OUTPATIENT_CLINIC_OR_DEPARTMENT_OTHER): Payer: Self-pay | Admitting: Nurse Practitioner

## 2021-12-14 DIAGNOSIS — R066 Hiccough: Secondary | ICD-10-CM

## 2021-12-14 DIAGNOSIS — R194 Change in bowel habit: Secondary | ICD-10-CM

## 2021-12-14 DIAGNOSIS — R1013 Epigastric pain: Secondary | ICD-10-CM

## 2021-12-14 MED ORDER — PANTOPRAZOLE SODIUM 40 MG PO TBEC: 40.0000 mg | DELAYED_RELEASE_TABLET | Freq: Two times a day (BID) | ORAL | 0 refills | Status: DC

## 2021-12-14 MED ORDER — DICYCLOMINE HCL 10 MG PO CAPS: 10.0000 mg | ORAL_CAPSULE | Freq: Three times a day (TID) | ORAL | 3 refills | Status: DC

## 2021-12-21 ENCOUNTER — Encounter (HOSPITAL_BASED_OUTPATIENT_CLINIC_OR_DEPARTMENT_OTHER): Payer: Self-pay

## 2021-12-21 ENCOUNTER — Ambulatory Visit (INDEPENDENT_AMBULATORY_CARE_PROVIDER_SITE_OTHER): Payer: Self-pay | Admitting: Obstetrics & Gynecology

## 2021-12-21 DIAGNOSIS — Z01419 Encounter for gynecological examination (general) (routine) without abnormal findings: Secondary | ICD-10-CM

## 2021-12-21 NOTE — Progress Notes (Unsigned)
Pt cancelled appt

## 2022-01-08 IMAGING — DX DG CHEST 2V
2 series · 2 of 2 positions shown · non-contrast
Comparison: 02/18/2013

CLINICAL DATA: Left shoulder and left breast pain

EXAM:
CHEST - 2 VIEW

[dg chest 2 view (1 of 2)]
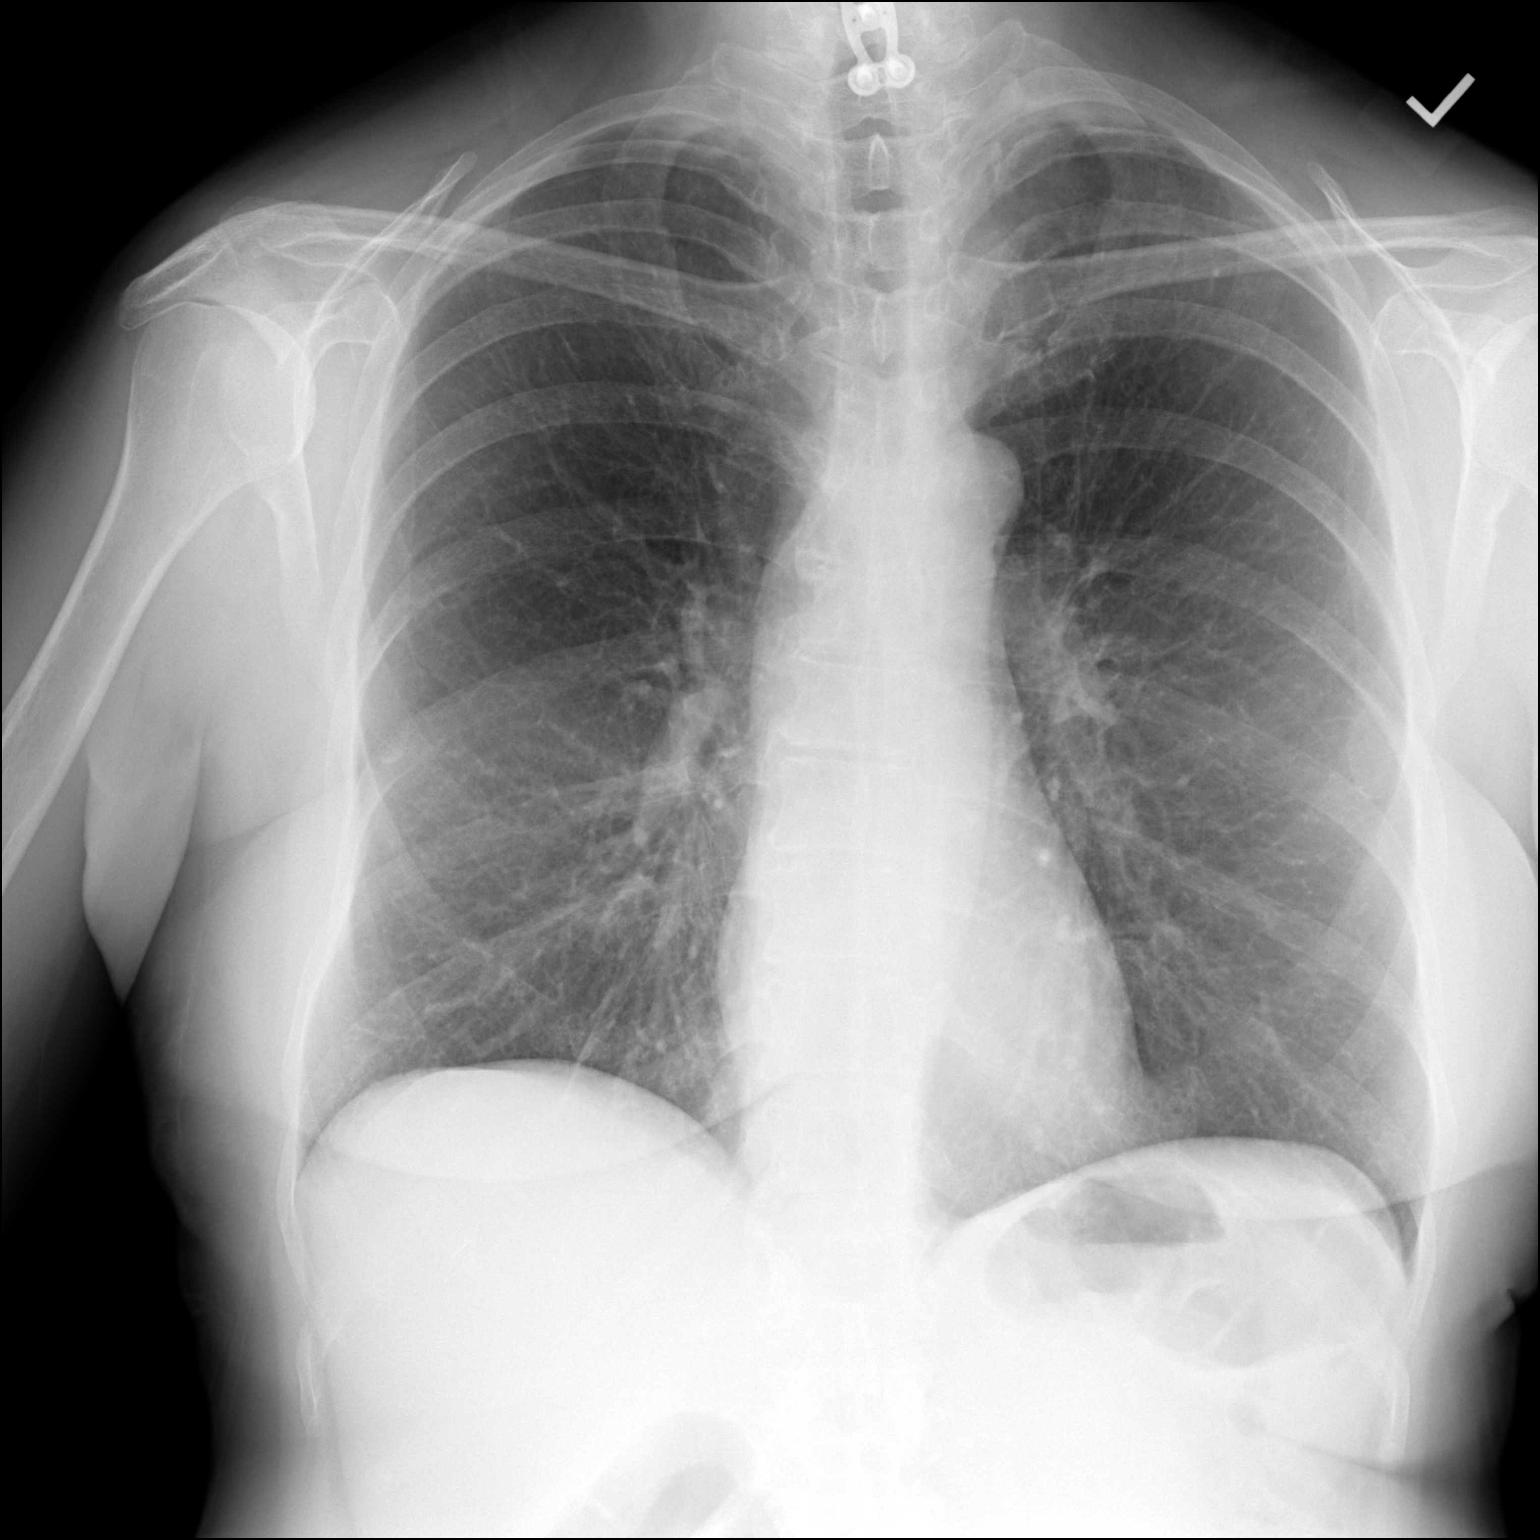

[dg chest 2 view (2 of 2)]
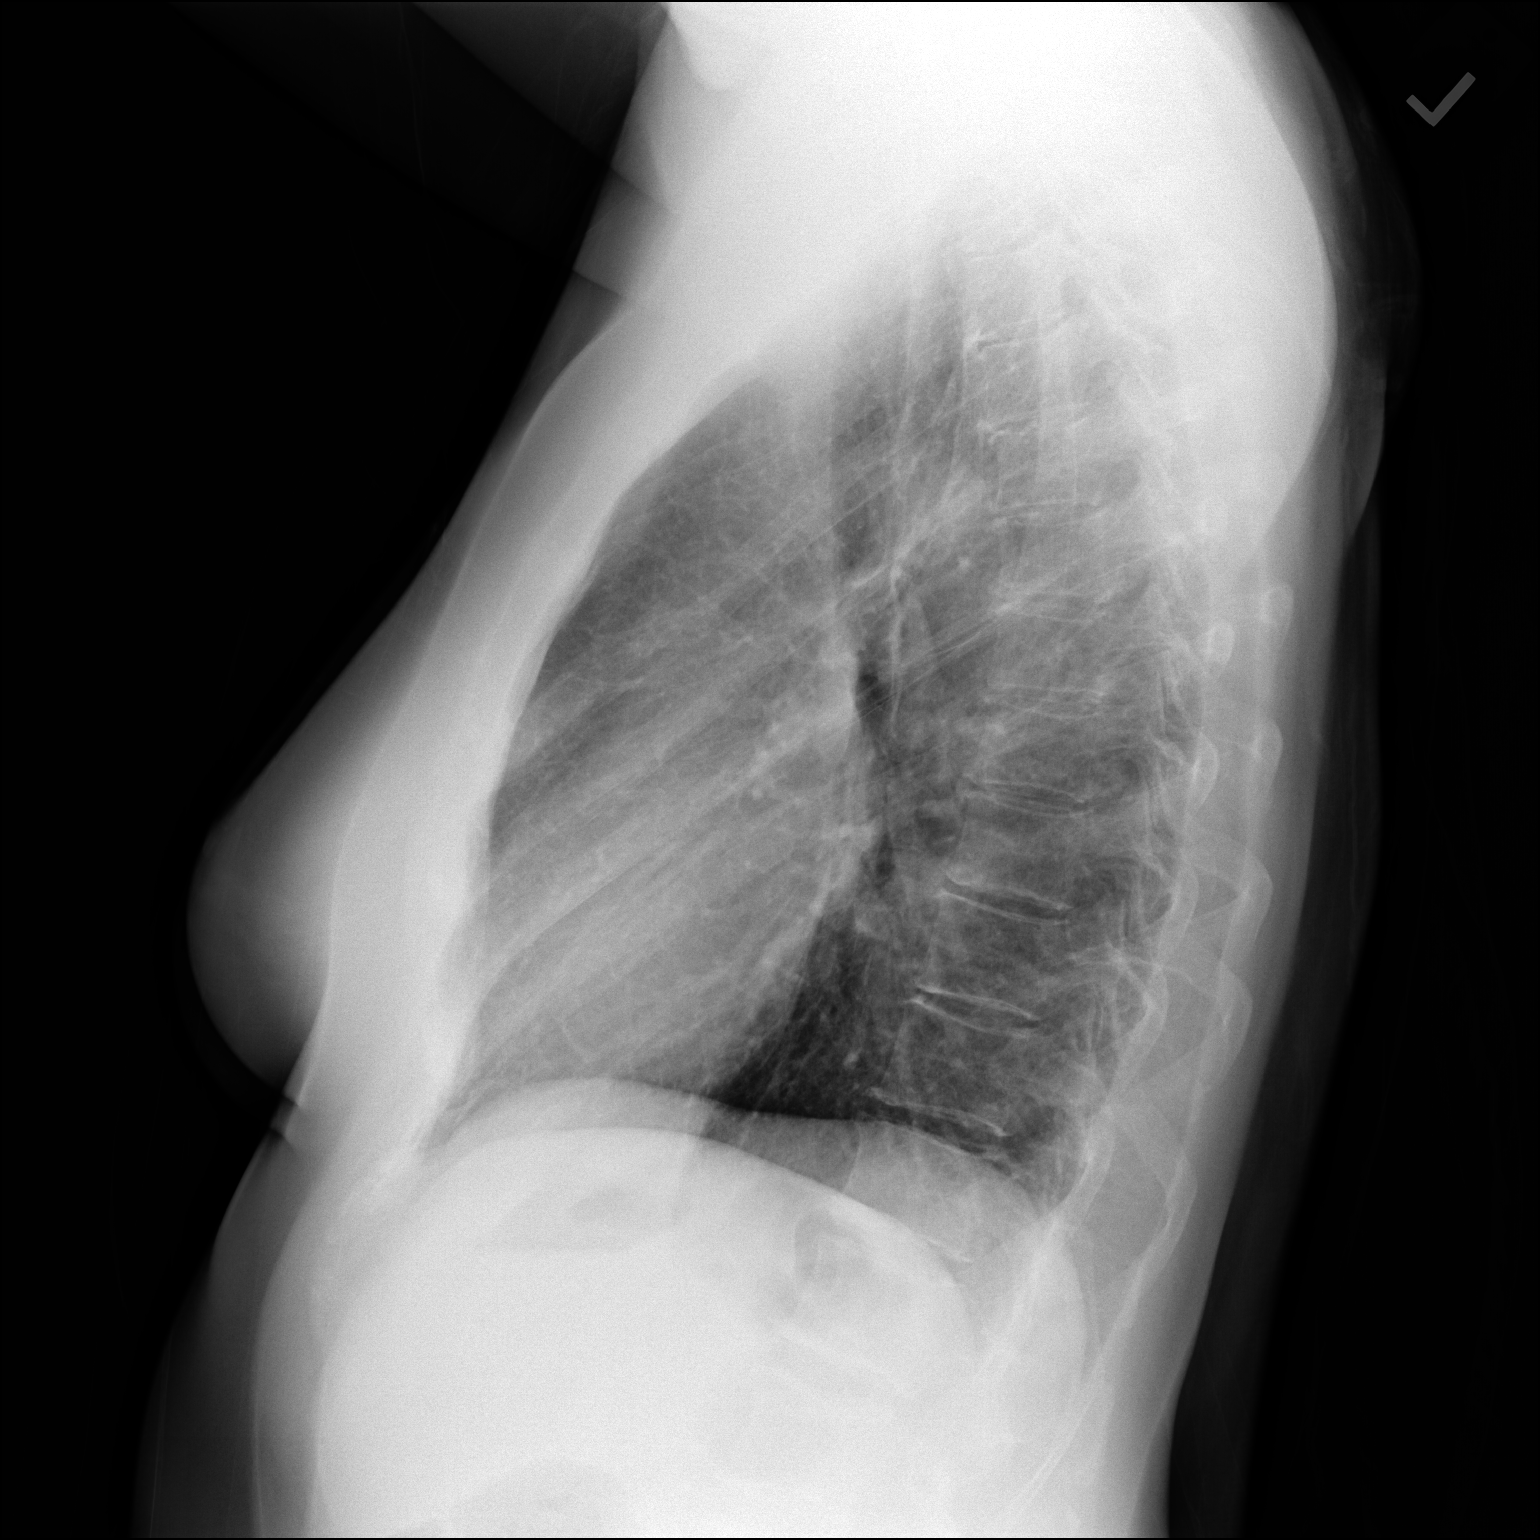

[2 of 2 positions shown; findings below may reference images not displayed]

FINDINGS: The heart size and mediastinal contours are within normal limits.
Both lungs are clear. The visualized skeletal structures are
unremarkable.

Stable postsurgical changes at the cervicothoracic junction.
IMPRESSION: 1. No acute intrathoracic process.

## 2022-04-09 ENCOUNTER — Encounter: Payer: Self-pay | Admitting: Nurse Practitioner

## 2022-04-09 ENCOUNTER — Ambulatory Visit (INDEPENDENT_AMBULATORY_CARE_PROVIDER_SITE_OTHER): Payer: Medicare Other | Admitting: Nurse Practitioner

## 2022-04-09 VITALS — BP 118/76 | HR 80 | Temp 98.3°F | Wt 173.4 lb

## 2022-04-09 DIAGNOSIS — M62838 Other muscle spasm: Secondary | ICD-10-CM

## 2022-04-09 DIAGNOSIS — K5900 Constipation, unspecified: Secondary | ICD-10-CM

## 2022-04-09 DIAGNOSIS — M76821 Posterior tibial tendinitis, right leg: Secondary | ICD-10-CM | POA: Insufficient documentation

## 2022-04-09 MED ORDER — CYCLOBENZAPRINE HCL 5 MG PO TABS: 5.0000 mg | ORAL_TABLET | Freq: Three times a day (TID) | ORAL | 2 refills | Status: DC | PRN

## 2022-04-09 NOTE — Progress Notes (Signed)
Orma Render, DNP, AGNP-c [facility] [address] [place], [place] [address] [phone]  Subjective:   Andrea Paul is a 67 y.o. female presents to day for evaluation of: Constipation Andrea Paul says this has been ongoing for a months. She has not had a history of similar. She has tried stool softener and stimulant laxatives with good results, but she usually has diarrhea with this. She has seen GI in the past and is due for her colonoscopy this year. She tells me she drinks 1/2 gallon of water a day. She has not started any new medications. She has not changed her diet or exercise. She does eat quite a bit of fiber daily.  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 She was diagnosed a while ago with podiary and typically manages with gentle stretches, orthotics, and bracing as needed. She tells me in the recent months the pain and exacerbations have been getting worse. She is concerned that this will continue to progress to the point that she is not able to function as she would like. She tells me that the brace does help, but she still cannot walk intermediate distances without significant pain.  PMH, Medications, and Allergies reviewed and updated in chart as appropriate.   ROS negative except for what is listed in HPI. Objective:  BP 118/76   Pulse 80   Temp 98.3 F (36.8 C)   Wt 173 lb 6.4 oz (78.7 kg)   LMP 10/18/2010   BMI 24.88 kg/m  Physical Exam Vitals and nursing note reviewed.  Constitutional:      General: She is not in acute distress.    Appearance: Normal appearance.  HENT:     Head: Normocephalic.  Eyes:     Extraocular Movements: Extraocular movements intact.     Conjunctiva/sclera: Conjunctivae normal.     Pupils: Pupils are equal, round, and reactive to light.  Neck:     Vascular: No carotid bruit.  Cardiovascular:     Rate and Rhythm: Normal rate and regular rhythm.     Pulses: Normal pulses.      Heart sounds: Normal heart sounds. No murmur heard. Pulmonary:     Effort: Pulmonary effort is normal.     Breath sounds: Normal breath sounds. No wheezing.  Abdominal:     General: Abdomen is flat. Bowel sounds are normal. There is no distension.     Palpations: Abdomen is soft. There is no mass.     Tenderness: There is no abdominal tenderness. There is no right CVA tenderness, left CVA tenderness, guarding or rebound.     Hernia: No hernia is present.  Musculoskeletal:        General: Swelling and tenderness present.     Cervical back: Normal range of motion and neck supple.     Right lower leg: No edema.     Left lower leg: No edema.     Comments: Right ankle tenderness and swelling. Brace in place  Lymphadenopathy:     Cervical: No cervical adenopathy.  Skin:    General: Skin is warm and dry.     Capillary Refill: Capillary refill takes less than 2 seconds.  Neurological:     General: No focal deficit present.     Mental Status: She is alert and oriented to person, place, and time.  Psychiatric:        Mood and Affect: Mood normal.        Behavior: Behavior normal.  Thought Content: Thought content normal.        Judgment: Judgment normal.           Assessment & Plan:   Problem List Items Addressed This Visit     Constipation - Primary    Chronic in nature with no known etiology. She does not have any alarm symptoms today and her exam is benign. I do want her to follow-up with GI for her colonoscopy, but in the meantime we will see if we can get improvement with adding miralax every other day or daily. I have provided samples for her. She may also try natural tea to help with movement of bowels. I have provided samples of this, as well. She will follow-up if these are not effective. May consider something like Linzess if she continues to have issues.       Posterior tibial tendonitis of right leg    Chronic with increased frequency of exacerbations. She is not  clear on what exercises she should be doing and what next steps she can take. I recommend trial of PT to see if this is helpful and she is in agreement. Will send referral to [facility]. Will consider ortho or repeat podiatry evaluation if no improvement.      Relevant Orders   Ambulatory referral to Physical Therapy   Other Visit Diagnoses     Muscle spasm of right lower extremity       Relevant Medications   cyclobenzaprine (FLEXERIL) 5 MG tablet         Orma Render, DNP, AGNP-c 04/09/2022  12:40 PM    History, Medications, Surgery, SDOH, and Family History reviewed and updated as appropriate.

## 2022-04-09 NOTE — Assessment & Plan Note (Signed)
Chronic with increased frequency of exacerbations. She is not clear on what exercises she should be doing and what next steps she can take. I recommend trial of PT to see if this is helpful and she is in agreement. Will send referral to [facility]. Will consider ortho or repeat podiatry evaluation if no improvement.

## 2022-04-09 NOTE — Assessment & Plan Note (Signed)
Chronic in nature with no known etiology. She does not have any alarm symptoms today and her exam is benign. I do want her to follow-up with GI for her colonoscopy, but in the meantime we will see if we can get improvement with adding miralax every other day or daily. I have provided samples for her. She may also try natural tea to help with movement of bowels. I have provided samples of this, as well. She will follow-up if these are not effective. May consider something like Linzess if she continues to have issues.

## 2022-04-09 NOTE — Patient Instructions (Signed)
I have sent the referral to physical therapy for you- they will call you to schedule this.   I have also sent in the cyclobenzaprine to help with the spasms.  I want you to try miralax to see if this helps keeps the stools moving. I would start with every other day dosing and increase to every day if not helping.   You can also try the tea.  Let me know if this is not helpful.

## 2022-04-11 LAB — HM MAMMOGRAPHY

## 2022-04-17 NOTE — Therapy (Signed)
OUTPATIENT PHYSICAL THERAPY LOWER EXTREMITY EVALUATION   Patient Name: Andrea Paul MRN: [id number] DOB:May 27, 1955, 67 y.o., female Today's Date: 04/18/2022  END OF SESSION:  PT End of Session - 04/18/22 1032     Visit Number 1    Number of Visits 12    Date for PT Re-Evaluation 05/30/22    Authorization Type MCR A and B    PT Start Time 0935    PT Stop Time 1025    PT Time Calculation (min) 50 min    Activity Tolerance Patient tolerated treatment well    Behavior During Therapy WFL for tasks assessed/performed             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    Encounter for medical examination to establish care 02/13/2021   Flatulence, eructation and gas pain 07/11/2021   Heart murmur    History of blood transfusion    age 64 years old   History of periodontal disease    Pain in gums 01/16/2013   Post-nasal drip 02/13/2021   Pre-diabetes    Sleep apnea    no CPAP prescribed, sleeps on sides   Past Surgical History:  Procedure Laterality Date   ANTERIOR CERVICAL DECOMP/DISCECTOMY FUSION  09/17/2011   Procedure: ANTERIOR CERVICAL DECOMPRESSION/DISCECTOMY FUSION 2 LEVELS;  Surgeon: Hewitt Shorts, MD;  Location: [facility];  Service: Neurosurgery;  Laterality: Bilateral;  Cervical five-six, six-seven anterior cervical decompression with fusion plating and bonegraft   BASAL CELL CARCINOMA EXCISION  2010   COLONOSCOPY  2014   DILATATION & CURETTAGE/HYSTEROSCOPY WITH MYOSURE N/A 12/19/2015   Procedure: DILATATION & CURETTAGE/HYSTEROSCOPY WITH MYOSURE;  Surgeon: Jerene Bears, MD;  Location: [facility];  Service: Gynecology;  Laterality: N/A;   GANGLION CYST EXCISION     right wrist   TONSILLECTOMY      Patient Active Problem List   Diagnosis Date Noted   Posterior tibial tendonitis of right leg 04/09/2022   Spasm of diaphragm 12/07/2021   Abnormal weight gain 07/11/2021   Constipation 07/11/2021   Gastro-esophageal reflux disease with esophagitis 07/11/2021   Rectal bleeding 07/11/2021   Allergic rhinitis 02/21/2021   Atrophic vaginitis 02/21/2021   Chronic fatigue syndrome 02/21/2021   Hyperlipidemia 02/21/2021   Insomnia 02/21/2021   Moderate recurrent major depression (HCC) 02/21/2021   Obstructive sleep apnea syndrome 02/21/2021   Prediabetes 02/21/2021   Muscle spasms of neck 02/21/2021   Memory loss or impairment 02/13/2021   Fibroids, intramural 11/27/2015   Generalized anxiety disorder 10/03/2015   Bipolar 2 disorder (HCC) 03/25/2013   Gingival and periodontal disease 01/16/2013    REFERRING PROVIDER: Tollie Eth, NP  REFERRING DIAG: [phone] (ICD-10-CM) - Posterior tibial tendonitis of right leg  THERAPY DIAG:  Pain in right ankle and joints of right foot  Muscle weakness (generalized)  Stiffness of right ankle, not elsewhere classified  Difficulty in walking, not elsewhere classified  Rationale for Evaluation and Treatment: Rehabilitation  ONSET DATE: approx 03/28/22  SUBJECTIVE:   SUBJECTIVE STATEMENT: Pt reports she was diagnosed with posterior tibial tendinitis a couple of years ago with podiatry.  She  has used orthotics and brace and states the brace works better than the orthotics.  Pt states she has used the brace for a couple of weeks.  Pt has chronic pain in R foot though her pain exacerbated approx 2-3 weeks ago.  She denies any specific mechanism for exacerbation.    Pt has increased pain with ambulation and is limited with ambulation distance.  Her pain is worse at the end of the day.  Pt has pain and difficulty with stairs and doesn't use her stairs as much due to pain.  She has pain with getting on her toes.  Pt is limited with walking dog  and has pain with ambulating on gravel driveway.  Pt states she has spasms in her toes and was prescribed a mm relaxer.       PERTINENT HISTORY: Chronic patellofemoral pain of both knees.  MD note indicated x rays showed mild OA predominantly involving the patellofemoral joint Neck Spasm, cervical fusion on 09/17/2011, and bipolar disorder   PAIN:  Are you having pain? Yes NPRS:  Current:  6/10, Worst:  8/10, Best:  2/10 Location:  Medial R foot and plantar surface of foot  PRECAUTIONS: None  WEIGHT BEARING RESTRICTIONS: No  FALLS:  Has patient fallen in last 6 months? No  LIVING ENVIRONMENT: Lives with: lives with their spouse Lives in: 2 story house Stairs: yes   OCCUPATION: Pt is retired  PLOF: Independent; Pt has chronic post tib pain though was able to perform her normal functional mobility including increased ambulation with less pain.  PATIENT GOALS: To be able to walk 1 mile and then as far as she wants to without brace.  Improved pain with walking.   NEXT MD VISIT:   OBJECTIVE:   DIAGNOSTIC FINDINGS: none recently  PATIENT SURVEYS:  FOTO:  Give next visit.   COGNITION: Overall cognitive status: Within functional limits for tasks assessed      POSTURE: flat feet bilat  PALPATION: Tender to palpate along R Post tib, medial distal tibia, superior portion of medial malleolus, and medial foot.  Minimally tender in L medial plantar surface  LOWER EXTREMITY ROM:   AROM Right eval Left eval  Hip flexion    Hip extension    Hip abduction    Hip adduction    Hip internal rotation    Hip external rotation    Knee flexion    Knee extension    Ankle dorsiflexion AROM/PROM:  7/11 with soreness 17  Ankle plantarflexion 68 with soreness 70  Ankle inversion 24 with pain 35  Ankle eversion 14 with soreness 17   (Blank rows = not tested)  LOWER EXTREMITY MMT:  MMT Right eval Left eval  Hip flexion    Hip extension    Hip abduction    Hip adduction     Hip internal rotation    Hip external rotation    Knee flexion    Knee extension    Ankle dorsiflexion 5/5 5/5  Ankle plantarflexion WFL with pain seated WFL seated   Ankle inversion 4/5 with soreness 4/5  Ankle eversion 4+/5 5/5   (Blank rows = not tested)   GAIT: Assistive device utilized: None Level of assistance: Complete Independence Comments: Pt has an antalgic limp with gait with decreased Wb'ing thru R LE and increased stance time on L.    TODAY'S TREATMENT:  Pt performed ankle ABC x 1 rep and ankle inversion with eccentric focus with YTB 2x10 reps.  Pt received a HEP handout and was educated in correct form and appropriate frequency.  Pt was instructed she should not have increased pain with exercises and to stop if she does have increased pain.  Pt instructed in icing and answered pt's questions concerning the rationale for using ice vs heat.  Pt instructed to ice ankle if she has increased pain or soreness later.    PATIENT EDUCATION:  Education details: dx, relevant anatomy, HEP, POC, and objective findings.  PT answered pt's questions.  Person educated: Patient Education method: Explanation, Demonstration, Tactile cues, Verbal cues, and Handouts Education comprehension: verbalized understanding, returned demonstration, verbal cues required, tactile cues required, and needs further education  HOME EXERCISE PROGRAM: Access Code: [email or web address] URL: [email or web address] Date: 04/18/2022 Prepared by: Aaron Edelman  Exercises - Seated Ankle Alphabet  - 1-2 x daily - 7 x weekly - 1 reps - Long Sitting Ankle Inversion with Anchored Resistance  - 1 x daily - 5-6 x weekly - 3 sets - 10 reps  ASSESSMENT:  CLINICAL IMPRESSION: Patient is a 67 y.o. female with a dx of R ankle Posterior tibial tendonitis presenting to the clinic with R  foot/ankle pain, limited ROM in R ankle, muscle weakness in bilat ankles, and difficulty in walking.  Pt has chronic pain in R post tib though has had a recent exacerbation which has affected her mobility.  Pt has increased pain with ambulation and is limited with ambulation distance.  Pt has pain with ambulating on uneven terrain and is limited with walking her dog.  Pt also has pain and difficulty with stairs.  Pt should benefit from skilled PT services to address impairments and improve function and mobility.    OBJECTIVE IMPAIRMENTS: Abnormal gait, decreased activity tolerance, decreased mobility, difficulty walking, decreased ROM, decreased strength, hypomobility, increased fascial restrictions, impaired flexibility, and pain.   ACTIVITY LIMITATIONS: standing, stairs, and locomotion level  PARTICIPATION LIMITATIONS: shopping, community activity, and walking dog  PERSONAL FACTORS: 1 comorbidity: bilat knee pain and chronic pain  are also affecting patient's functional outcome.   REHAB POTENTIAL: Good  CLINICAL DECISION MAKING: Stable/uncomplicated  EVALUATION COMPLEXITY: Low   GOALS:   SHORT TERM GOALS: Target date:  05/09/2022  Pt will be independent and compliant with HEP for improved ROM, pain, strength, and function.  Baseline: Goal status: INITIAL  2.  Pt will demo at least a 5-8 deg improvement in R ankle Inv and DF AROM for improved mobility, gait, and stiffness.   Baseline:  Goal status: INITIAL  3.  Pt will report at least a 25% improvement in pain with ambulation.  Baseline:  Goal status: INITIAL  4.  Pt will demo improved gait with improved Wb'ing thru and increased stance time on R LE.  Baseline:  Goal status: INITIAL   LONG TERM GOALS: Target date: 05/30/2022  Pt will be able to ambulate her normal distance without significant pain and difficulty.  Baseline:  Goal status: INITIAL  2.  Pt will be able to walk her dog including on her drive way without  significant pain and difficulty.  Baseline:  Goal status: INITIAL  3.  Pt will demo improved R inversion ankle strength and Post tib to 5/5 MMT for improved mobility with reduced pain and increased ease.  Baseline:  Goal status: INITIAL  4.  Pt will be able to perform stairs without significant ankle/foot pain.  Baseline:  Goal status: INITIAL     PLAN:  PT FREQUENCY: 2x/week  PT DURATION: 6 weeks  PLANNED INTERVENTIONS: Therapeutic exercises, Therapeutic activity, Neuromuscular re-education, Balance training, Gait training, Patient/Family education, Self Care, Joint mobilization, Stair training, Aquatic Therapy, Dry Needling, Electrical stimulation, Cryotherapy, Moist heat, Taping, Ultrasound, Manual therapy, and Re-evaluation  PLAN FOR NEXT SESSION: Review and perform HEP.  Ankle ROM, foot intrinsics, and post tib eccentrics.  IASTM to post tib. Give FOTO next visit.   Audie Clear III PT, DPT 04/18/22 11:34 PM

## 2022-04-18 ENCOUNTER — Other Ambulatory Visit: Payer: Self-pay

## 2022-04-18 ENCOUNTER — Encounter (HOSPITAL_BASED_OUTPATIENT_CLINIC_OR_DEPARTMENT_OTHER): Payer: Self-pay | Admitting: Physical Therapy

## 2022-04-18 ENCOUNTER — Ambulatory Visit (HOSPITAL_BASED_OUTPATIENT_CLINIC_OR_DEPARTMENT_OTHER): Payer: Medicare Other | Attending: Nurse Practitioner | Admitting: Physical Therapy

## 2022-04-18 DIAGNOSIS — M76821 Posterior tibial tendinitis, right leg: Secondary | ICD-10-CM | POA: Insufficient documentation

## 2022-04-18 DIAGNOSIS — M25671 Stiffness of right ankle, not elsewhere classified: Secondary | ICD-10-CM | POA: Insufficient documentation

## 2022-04-18 DIAGNOSIS — M6281 Muscle weakness (generalized): Secondary | ICD-10-CM | POA: Insufficient documentation

## 2022-04-18 DIAGNOSIS — R262 Difficulty in walking, not elsewhere classified: Secondary | ICD-10-CM | POA: Diagnosis present

## 2022-04-18 DIAGNOSIS — M25571 Pain in right ankle and joints of right foot: Secondary | ICD-10-CM | POA: Diagnosis not present

## 2022-04-19 ENCOUNTER — Encounter: Payer: Self-pay | Admitting: Nurse Practitioner

## 2022-04-25 ENCOUNTER — Ambulatory Visit (HOSPITAL_BASED_OUTPATIENT_CLINIC_OR_DEPARTMENT_OTHER): Payer: Medicare Other | Attending: Nurse Practitioner | Admitting: Physical Therapy

## 2022-04-25 ENCOUNTER — Encounter (HOSPITAL_BASED_OUTPATIENT_CLINIC_OR_DEPARTMENT_OTHER): Payer: Self-pay | Admitting: Physical Therapy

## 2022-04-25 DIAGNOSIS — M6281 Muscle weakness (generalized): Secondary | ICD-10-CM | POA: Diagnosis present

## 2022-04-25 DIAGNOSIS — M25571 Pain in right ankle and joints of right foot: Secondary | ICD-10-CM

## 2022-04-25 DIAGNOSIS — M25671 Stiffness of right ankle, not elsewhere classified: Secondary | ICD-10-CM

## 2022-04-25 DIAGNOSIS — R262 Difficulty in walking, not elsewhere classified: Secondary | ICD-10-CM | POA: Diagnosis present

## 2022-04-25 NOTE — Therapy (Signed)
OUTPATIENT PHYSICAL THERAPY TREATMENT NOTE   Patient Name: Andrea Paul MRN: [id number] DOB:02/21/1956, 67 y.o., female Today's Date: 04/26/2022  END OF SESSION:  PT End of Session - 04/25/22 0858     Visit Number 2    Number of Visits 12    Date for PT Re-Evaluation 05/30/22    Authorization Type MCR A and B    PT Start Time 0849    PT Stop Time 0928    PT Time Calculation (min) 39 min    Activity Tolerance Patient tolerated treatment well    Behavior During Therapy [facility] for tasks assessed/performed             Past Medical History:  Diagnosis Date   Anemia    history of   Anxiety    does not take daily unless needed   Arthritis    bilateral hands   Basal cell carcinoma    Bilateral hearing loss due to cerumen impaction 02/13/2021   Bipolar 1 disorder ([place])        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    Encounter for medical examination to establish care 02/13/2021   Flatulence, eructation and gas pain 07/11/2021   Heart murmur    History of blood transfusion    age 41 years old   History of periodontal disease    Pain in gums 01/16/2013   Post-nasal drip 02/13/2021   Pre-diabetes    Sleep apnea    no CPAP prescribed, sleeps on sides   Past Surgical History:  Procedure Laterality Date   ANTERIOR CERVICAL DECOMP/DISCECTOMY FUSION  09/17/2011   Procedure: ANTERIOR CERVICAL DECOMPRESSION/DISCECTOMY FUSION 2 LEVELS;  Surgeon: Hosie Spangle, MD;  Location: [place] [facility];  Service: Neurosurgery;  Laterality: Bilateral;  Cervical five-six, six-seven anterior cervical decompression with fusion plating and bonegraft   BASAL CELL CARCINOMA EXCISION  2010   COLONOSCOPY  2014   DILATATION & CURETTAGE/HYSTEROSCOPY WITH MYOSURE N/A 12/19/2015   Procedure: [place];  Surgeon: Megan Salon, MD;  Location: [place] [facility];  Service: Gynecology;  Laterality: N/A;   GANGLION CYST EXCISION     right wrist   TONSILLECTOMY      Patient Active Problem List   Diagnosis Date Noted   Posterior tibial tendonitis of right leg 04/09/2022   Spasm of diaphragm 12/07/2021   Abnormal weight gain 07/11/2021   Constipation 07/11/2021   Gastro-esophageal reflux disease with esophagitis 07/11/2021   Rectal bleeding 07/11/2021   Allergic rhinitis 02/21/2021   Atrophic vaginitis 02/21/2021   Chronic fatigue syndrome 02/21/2021   Hyperlipidemia 02/21/2021   Insomnia 02/21/2021   Moderate recurrent major depression ([place]) 02/21/2021   Obstructive sleep apnea syndrome 02/21/2021   Prediabetes 02/21/2021   Muscle spasms of neck 02/21/2021   Memory loss or impairment 02/13/2021   Fibroids, intramural 11/27/2015   Generalized anxiety disorder 10/03/2015   Bipolar 2 disorder ([place]) 03/25/2013   Gingival and periodontal disease 01/16/2013    REFERRING PROVIDER: Orma Render, NP  REFERRING DIAG: [id number] (ICD-10-CM) - Posterior tibial tendonitis of right leg  THERAPY DIAG:  Pain in right ankle and joints of right foot  Muscle weakness (generalized)  Stiffness of right ankle, not elsewhere classified  Difficulty in walking, not elsewhere classified  Rationale for Evaluation and Treatment: Rehabilitation  ONSET DATE: approx 03/28/22  SUBJECTIVE:   SUBJECTIVE STATEMENT: Pt states she didn't sleep well last night, so she is not doing as good this AM.  Pt  is not wearing her brace today, due to wearing her orthotic.  Pt's foot does not feel as good when she wears the orthotic.  Pt reports compliance with HEP though has performed more AROM with eccentric inversion instead of using the band.  Pt denies any adverse effects after prior Rx.  She did use ice after prior Rx.  Pt hasn't had the sharp pains.    Pt has increased pain with ambulation and is limited with ambulation distance.  Her pain is worse at the end of the day.  Pt has pain and difficulty with stairs and doesn't use her stairs as much due to pain.  Pt is limited  with walking dog and has pain with ambulating on gravel driveway.  Pt states she has spasms in her toes.     PERTINENT HISTORY: Chronic patellofemoral pain of both knees.  MD note indicated x rays showed mild OA predominantly involving the patellofemoral joint Neck Spasm, cervical fusion on 09/17/2011, and bipolar disorder   PAIN:  Are you having pain? Yes NPRS:  Current:  3/10, Worst:  8/10, Best:  2/10 Location:  Medial R foot and plantar surface of foot  PRECAUTIONS: None  WEIGHT BEARING RESTRICTIONS: No  FALLS:  Has patient fallen in last 6 months? No  LIVING ENVIRONMENT: Lives with: lives with their spouse Lives in: 2 story house Stairs: yes   OCCUPATION: Pt is retired  PLOF: Independent; Pt has chronic post tib pain though was able to perform her normal functional mobility including increased ambulation with less pain.  PATIENT GOALS: To be able to walk 1 mile and then as far as she wants to without brace.  Improved pain with walking.   NEXT MD VISIT:   OBJECTIVE:   DIAGNOSTIC FINDINGS: none recently   POSTURE: flat feet bilat   TODAY'S TREATMENT:                                                                                                                                 Therapeutic Exercise: -Reviewed response to prior Rx, HEP compliance, pain level, and current function.  -Pt performed:   ankle ABC x 1 rep   ankle inversion with eccentric focus with YTB 3x10 reps   Longsitting gastroc stretch 3x30 sec   Towel crunches  -See below for pt education -Pt completed FOTO:  19 with a goal of 62 at visit #13   Manual Therapy: IASTM to medial calf along post tib in long sitting with back supported to improve pain, reduce tightness and myofascial adhesions, and promote proper cross fiber alignment.     PATIENT EDUCATION:  Education details:  PT answered pt's questions.  Educated pt concerning dx, relevant anatomy, HEP, POC, and eval findings.  Pt instructed  to ice ankle if she has increased pain or soreness later.     Person educated: Patient Education method: Explanation, Demonstration, Tactile cues, Verbal cues Education comprehension: verbalized understanding, returned  demonstration, verbal cues required, tactile cues required, and needs further education  HOME EXERCISE PROGRAM: Access Code: [email or web address] URL: [email or web address] Date: 04/18/2022 Prepared by: Ronny Flurry  Exercises - Seated Ankle Alphabet  - 1-2 x daily - 7 x weekly - 1 reps - Long Sitting Ankle Inversion with Anchored Resistance  - 1 x daily - 5-6 x weekly - 3 sets - 10 reps  ASSESSMENT:  CLINICAL IMPRESSION: Pt has a chronic hx of post tib tendonitis.  PT reviewed HEP and worked on DF ROM, eccentric strength, and foot intrinsics.  Pt was able to perform towel crunches though was fatigued quickly and had discomfort after towel crunches.  Pt performed inversion eccentric with YTB with good control.  IASTM was performed gently due to reports of tenderness and pain with IASTM.  Pt states she feels better after IASTM.  Pt responded well to Rx stating she felt better after Rx.  Pt should benefit from skilled PT services to address impairments and improve function and mobility.   OBJECTIVE IMPAIRMENTS: Abnormal gait, decreased activity tolerance, decreased mobility, difficulty walking, decreased ROM, decreased strength, hypomobility, increased fascial restrictions, impaired flexibility, and pain.   ACTIVITY LIMITATIONS: standing, stairs, and locomotion level  PARTICIPATION LIMITATIONS: shopping, community activity, and walking dog  PERSONAL FACTORS: 1 comorbidity: bilat knee pain and chronic pain  are also affecting patient's functional outcome.   REHAB POTENTIAL: Good  CLINICAL DECISION MAKING: Stable/uncomplicated  EVALUATION COMPLEXITY: Low   GOALS:   SHORT TERM GOALS: Target date:  05/09/2022  Pt will be independent and compliant with HEP for  improved ROM, pain, strength, and function.  Baseline: Goal status: INITIAL  2.  Pt will demo at least a 5-8 deg improvement in R ankle Inv and DF AROM for improved mobility, gait, and stiffness.   Baseline:  Goal status: INITIAL  3.  Pt will report at least a 25% improvement in pain with ambulation.  Baseline:  Goal status: INITIAL  4.  Pt will demo improved gait with improved Wb'ing thru and increased stance time on R LE.  Baseline:  Goal status: INITIAL   LONG TERM GOALS: Target date: 05/30/2022  Pt will be able to ambulate her normal distance without significant pain and difficulty.  Baseline:  Goal status: INITIAL  2.  Pt will be able to walk her dog including on her drive way without significant pain and difficulty.  Baseline:  Goal status: INITIAL  3.  Pt will demo improved R inversion ankle strength and Post tib to 5/5 MMT for improved mobility with reduced pain and increased ease.  Baseline:  Goal status: INITIAL  4.  Pt will be able to perform stairs without significant ankle/foot pain.  Baseline:  Goal status: INITIAL     PLAN:  PT FREQUENCY: 2x/week  PT DURATION: 6 weeks  PLANNED INTERVENTIONS: Therapeutic exercises, Therapeutic activity, Neuromuscular re-education, Balance training, Gait training, Patient/Family education, Self Care, Joint mobilization, Stair training, Aquatic Therapy, Dry Needling, Electrical stimulation, Cryotherapy, Moist heat, Taping, Ultrasound, Manual therapy, and Re-evaluation  PLAN FOR NEXT SESSION: Review and perform HEP.  Ankle ROM, foot intrinsics, and post tib eccentrics.  IASTM to post tib.    Selinda Michaels III PT, DPT 04/26/22 10:08 AM

## 2022-04-30 ENCOUNTER — Telehealth: Payer: Self-pay | Admitting: Nurse Practitioner

## 2022-04-30 NOTE — Telephone Encounter (Signed)
Pt called and said that her gastro dr told her not to use the smoothe move  It would cause the lining to black and not to use it she wanted to let you know that

## 2022-05-07 ENCOUNTER — Ambulatory Visit (HOSPITAL_BASED_OUTPATIENT_CLINIC_OR_DEPARTMENT_OTHER): Payer: Medicare Other

## 2022-05-07 DIAGNOSIS — M25571 Pain in right ankle and joints of right foot: Secondary | ICD-10-CM | POA: Diagnosis not present

## 2022-05-07 DIAGNOSIS — M6281 Muscle weakness (generalized): Secondary | ICD-10-CM

## 2022-05-07 DIAGNOSIS — M25671 Stiffness of right ankle, not elsewhere classified: Secondary | ICD-10-CM

## 2022-05-07 DIAGNOSIS — R262 Difficulty in walking, not elsewhere classified: Secondary | ICD-10-CM

## 2022-05-07 NOTE — Therapy (Unsigned)
OUTPATIENT PHYSICAL THERAPY TREATMENT NOTE   Patient Name: Andrea Paul MRN: [id number] DOB:[birth year], 67 y.o., female Today's Date: 05/08/2022  END OF SESSION:  PT End of Session - 05/07/22 1618     Visit Number 3    Number of Visits 12    Date for PT Re-Evaluation 05/30/22    Authorization Type MCR A and B    PT Start Time 1515    PT Stop Time 1555    PT Time Calculation (min) 40 min    Activity Tolerance Patient tolerated treatment well    Behavior During Therapy WFL for tasks assessed/performed              Past Medical History:  Diagnosis Date   Anemia    history of   Anxiety    does not take daily unless needed   Arthritis    bilateral hands   Basal cell carcinoma    Bilateral hearing loss due to cerumen impaction 02/13/2021   Bipolar 1 disorder ([place])        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    Encounter for medical examination to establish care 02/13/2021   Flatulence, eructation and gas pain 07/11/2021   Heart murmur    History of blood transfusion    age 62 years old   History of periodontal disease    Pain in gums 01/16/2013   Post-nasal drip 02/13/2021   Pre-diabetes    Sleep apnea    no CPAP prescribed, sleeps on sides   Past Surgical History:  Procedure Laterality Date   ANTERIOR CERVICAL DECOMP/DISCECTOMY FUSION  09/17/2011   Procedure: ANTERIOR CERVICAL DECOMPRESSION/DISCECTOMY FUSION 2 LEVELS;  Surgeon: Hosie Spangle, MD;  Location: [facility];  Service: Neurosurgery;  Laterality: Bilateral;  Cervical five-six, six-seven anterior cervical decompression with fusion plating and bonegraft   BASAL CELL CARCINOMA EXCISION  2010   COLONOSCOPY  2014   DILATATION & CURETTAGE/HYSTEROSCOPY WITH MYOSURE N/A 12/19/2015   Procedure: [place];  Surgeon: Megan Salon, MD;  Location: [facility];  Service: Gynecology;  Laterality: N/A;   GANGLION CYST EXCISION     right wrist   TONSILLECTOMY      Patient Active Problem List   Diagnosis Date Noted   Posterior tibial tendonitis of right leg 04/09/2022   Spasm of diaphragm 12/07/2021   Abnormal weight gain 07/11/2021   Constipation 07/11/2021   Gastro-esophageal reflux disease with esophagitis 07/11/2021   Rectal bleeding 07/11/2021   Allergic rhinitis 02/21/2021   Atrophic vaginitis 02/21/2021   Chronic fatigue syndrome 02/21/2021   Hyperlipidemia 02/21/2021   Insomnia 02/21/2021   Moderate recurrent major depression ([place]) 02/21/2021   Obstructive sleep apnea syndrome 02/21/2021   Prediabetes 02/21/2021   Muscle spasms of neck 02/21/2021   Memory loss or impairment 02/13/2021   Fibroids, intramural 11/27/2015   Generalized anxiety disorder 10/03/2015   Bipolar 2 disorder ([place]) 03/25/2013   Gingival and periodontal disease 01/16/2013    REFERRING PROVIDER: Orma Render, NP  REFERRING DIAG: [phone] (ICD-10-CM) - Posterior tibial tendonitis of right leg  THERAPY DIAG:  Pain in right ankle and joints of right foot  Stiffness of right ankle, not elsewhere classified  Muscle weakness (generalized)  Difficulty in walking, not elsewhere classified  Rationale for Evaluation and Treatment: Rehabilitation  ONSET DATE: approx 03/28/22  SUBJECTIVE:   SUBJECTIVE STATEMENT: Pt reports towel scrunches cause pain so she switched to just toe curls without towel. Reports she has  callous on medial aspect of arch from her orthotics over the years.      PERTINENT HISTORY: Chronic patellofemoral pain of both knees.  MD note indicated x rays showed mild OA predominantly involving the patellofemoral joint Neck Spasm, cervical fusion on 09/17/2011, and bipolar disorder   PAIN:  Are you having pain? Yes NPRS:  Current:  3/10, Worst:  8/10, Best:  2/10 Location:  Medial R foot and plantar surface of foot  PRECAUTIONS: None  WEIGHT BEARING RESTRICTIONS: No  FALLS:  Has patient fallen in last 6 months? No  LIVING  ENVIRONMENT: Lives with: lives with their spouse Lives in: 2 story house Stairs: yes   OCCUPATION: Pt is retired  PLOF: Independent; Pt has chronic post tib pain though was able to perform her normal functional mobility including increased ambulation with less pain.  PATIENT GOALS: To be able to walk 1 mile and then as far as she wants to without brace.  Improved pain with walking.   NEXT MD VISIT:   OBJECTIVE:   DIAGNOSTIC FINDINGS: none recently   POSTURE: flat feet bilat   TODAY'S TREATMENT:                                                                                                                                2/19: -Gastroc stretch-manual 30sec/3 -Soleus stretch-manual 30secx3 -Tennis ball to plantar fascia -abd/add seated with YTB 2x10ea -Eccentric ankle inversion with YTB 3x10 -Ankle ABC x1 -Ankle circles x15ea  STM to post tib, distal gastroc/soleus, peroneals     Previous:  Therapeutic Exercise: -Reviewed response to prior Rx, HEP compliance, pain level, and current function.  -Pt performed:   ankle ABC x 1 rep   ankle inversion with eccentric focus with YTB 3x10 reps   Longsitting gastroc stretch 3x30 sec   Towel crunches  -See below for pt education -Pt completed FOTO:  47 with a goal of 62 at visit #13   Manual Therapy: IASTM to medial calf along post tib in long sitting with back supported to improve pain, reduce tightness and myofascial adhesions, and promote proper cross fiber alignment.     PATIENT EDUCATION:  Education details:  PT answered pt's questions.  Educated pt concerning dx, relevant anatomy, HEP, POC, and eval findings.  Pt instructed to ice ankle if she has increased pain or soreness later.     Person educated: Patient Education method: Explanation, Demonstration, Tactile cues, Verbal cues Education comprehension: verbalized understanding, returned demonstration, verbal cues required, tactile cues required, and needs further  education  HOME EXERCISE PROGRAM: Access Code: [email or web address] URL: [email or web address] Date: 04/18/2022 Prepared by: Ronny Flurry  Exercises - Seated Ankle Alphabet  - 1-2 x daily - 7 x weekly - 1 reps - Long Sitting Ankle Inversion with Anchored Resistance  - 1 x daily - 5-6 x weekly - 3 sets - 10 reps  ASSESSMENT:  CLINICAL IMPRESSION: Pt very tender throughout length of post tib,  gastroc/soleus complex, peroneals, and plantar fascia. Utilized STM to address this. Also instructed pt in tennis ball MFR to plantar fascia which she can perform at home. Instructed her to remain within pain limitations with this. Also advised pt to f/u with foot MD regarding orthotics as there is concern with callous formation in medial arch of foot. Instructed pt in ice use and ice massage to affected areas. Pain in plantar fascia with towel scrunches so held these today. Will continue to progress as tolerated.    OBJECTIVE IMPAIRMENTS: Abnormal gait, decreased activity tolerance, decreased mobility, difficulty walking, decreased ROM, decreased strength, hypomobility, increased fascial restrictions, impaired flexibility, and pain.   ACTIVITY LIMITATIONS: standing, stairs, and locomotion level  PARTICIPATION LIMITATIONS: shopping, community activity, and walking dog  PERSONAL FACTORS: 1 comorbidity: bilat knee pain and chronic pain  are also affecting patient's functional outcome.   REHAB POTENTIAL: Good  CLINICAL DECISION MAKING: Stable/uncomplicated  EVALUATION COMPLEXITY: Low   GOALS:   SHORT TERM GOALS: Target date:  05/09/2022  Pt will be independent and compliant with HEP for improved ROM, pain, strength, and function.  Baseline: Goal status: MET 05/07/22  2.  Pt will demo at least a 5-8 deg improvement in R ankle Inv and DF AROM for improved mobility, gait, and stiffness.   Baseline:  Goal status: INITIAL  3.  Pt will report at least a 25% improvement in pain with  ambulation.  Baseline:  Goal status: INITIAL  4.  Pt will demo improved gait with improved Wb'ing thru and increased stance time on R LE.  Baseline:  Goal status: INITIAL   LONG TERM GOALS: Target date: 05/30/2022  Pt will be able to ambulate her normal distance without significant pain and difficulty.  Baseline:  Goal status: INITIAL  2.  Pt will be able to walk her dog including on her drive way without significant pain and difficulty.  Baseline:  Goal status: INITIAL  3.  Pt will demo improved R inversion ankle strength and Post tib to 5/5 MMT for improved mobility with reduced pain and increased ease.  Baseline:  Goal status: INITIAL  4.  Pt will be able to perform stairs without significant ankle/foot pain.  Baseline:  Goal status: INITIAL     PLAN:  PT FREQUENCY: 2x/week  PT DURATION: 6 weeks  PLANNED INTERVENTIONS: Therapeutic exercises, Therapeutic activity, Neuromuscular re-education, Balance training, Gait training, Patient/Family education, Self Care, Joint mobilization, Stair training, Aquatic Therapy, Dry Needling, Electrical stimulation, Cryotherapy, Moist heat, Taping, Ultrasound, Manual therapy, and Re-evaluation  PLAN FOR NEXT SESSION: Review and perform HEP.  Ankle ROM, foot intrinsics, and post tib eccentrics.  IASTM to post tib.    Sherlynn Carbon, PTA  05/08/22 7:47 AM

## 2022-05-08 ENCOUNTER — Encounter (HOSPITAL_BASED_OUTPATIENT_CLINIC_OR_DEPARTMENT_OTHER): Payer: Self-pay

## 2022-05-09 ENCOUNTER — Ambulatory Visit (HOSPITAL_BASED_OUTPATIENT_CLINIC_OR_DEPARTMENT_OTHER): Payer: Medicare Other | Admitting: Physical Therapy

## 2022-05-09 ENCOUNTER — Encounter (HOSPITAL_BASED_OUTPATIENT_CLINIC_OR_DEPARTMENT_OTHER): Payer: Self-pay | Admitting: Physical Therapy

## 2022-05-09 DIAGNOSIS — R262 Difficulty in walking, not elsewhere classified: Secondary | ICD-10-CM

## 2022-05-09 DIAGNOSIS — M25671 Stiffness of right ankle, not elsewhere classified: Secondary | ICD-10-CM

## 2022-05-09 DIAGNOSIS — M25571 Pain in right ankle and joints of right foot: Secondary | ICD-10-CM

## 2022-05-09 DIAGNOSIS — M6281 Muscle weakness (generalized): Secondary | ICD-10-CM

## 2022-05-09 NOTE — Therapy (Signed)
OUTPATIENT PHYSICAL THERAPY TREATMENT NOTE   Patient Name: Andrea Paul MRN: [id number] DOB:[birth year], 67 y.o., female Today's Date: 05/10/2022  END OF SESSION:  PT End of Session - 05/09/22 1446     Visit Number 4    Number of Visits 12    Date for PT Re-Evaluation 05/30/22    Authorization Type MCR A and B    PT Start Time [id number]    PT Stop Time 1514    PT Time Calculation (min) 35 min    Activity Tolerance Patient tolerated treatment well    Behavior During Therapy WFL for tasks assessed/performed              Past Medical History:  Diagnosis Date   Anemia    history of   Anxiety    does not take daily unless needed   Arthritis    bilateral hands   Basal cell carcinoma    Bilateral hearing loss due to cerumen impaction 02/13/2021   Bipolar 1 disorder ([place])        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    Encounter for medical examination to establish care 02/13/2021   Flatulence, eructation and gas pain 07/11/2021   Heart murmur    History of blood transfusion    age 22 years old   History of periodontal disease    Pain in gums 01/16/2013   Post-nasal drip 02/13/2021   Pre-diabetes    Sleep apnea    no CPAP prescribed, sleeps on sides   Past Surgical History:  Procedure Laterality Date   ANTERIOR CERVICAL DECOMP/DISCECTOMY FUSION  09/17/2011   Procedure: ANTERIOR CERVICAL DECOMPRESSION/DISCECTOMY FUSION 2 LEVELS;  Surgeon: Hosie Spangle, MD;  Location: [facility];  Service: Neurosurgery;  Laterality: Bilateral;  Cervical five-six, six-seven anterior cervical decompression with fusion plating and bonegraft   BASAL CELL CARCINOMA EXCISION  2010   COLONOSCOPY  2014   DILATATION & CURETTAGE/HYSTEROSCOPY WITH MYOSURE N/A 12/19/2015   Procedure: [place];  Surgeon: Megan Salon, MD;  Location: [facility];  Service: Gynecology;  Laterality: N/A;   GANGLION CYST EXCISION     right wrist   TONSILLECTOMY      Patient Active Problem List   Diagnosis Date Noted   Posterior tibial tendonitis of right leg 04/09/2022   Spasm of diaphragm 12/07/2021   Abnormal weight gain 07/11/2021   Constipation 07/11/2021   Gastro-esophageal reflux disease with esophagitis 07/11/2021   Rectal bleeding 07/11/2021   Allergic rhinitis 02/21/2021   Atrophic vaginitis 02/21/2021   Chronic fatigue syndrome 02/21/2021   Hyperlipidemia 02/21/2021   Insomnia 02/21/2021   Moderate recurrent major depression ([place]) 02/21/2021   Obstructive sleep apnea syndrome 02/21/2021   Prediabetes 02/21/2021   Muscle spasms of neck 02/21/2021   Memory loss or impairment 02/13/2021   Fibroids, intramural 11/27/2015   Generalized anxiety disorder 10/03/2015   Bipolar 2 disorder ([place]) 03/25/2013   Gingival and periodontal disease 01/16/2013    REFERRING PROVIDER: Orma Render, NP  REFERRING DIAG: [phone] (ICD-10-CM) - Posterior tibial tendonitis of right leg  THERAPY DIAG:  Pain in right ankle and joints of right foot  Stiffness of right ankle, not elsewhere classified  Muscle weakness (generalized)  Difficulty in walking, not elsewhere classified  Rationale for Evaluation and Treatment: Rehabilitation  ONSET DATE: approx 03/28/22  SUBJECTIVE:   SUBJECTIVE STATEMENT: Pt reports towel scrunches cause pain so she switched to just toe curls without towel.  Pt hasn't  made an appt yet about her orthotic.  Pt states she had increased pain about a 3/10 after prior Rx.  Pt denies any current functional improvement.      PERTINENT HISTORY: Chronic patellofemoral pain of both knees.  MD note indicated x rays showed mild OA predominantly involving the patellofemoral joint Neck Spasm, cervical fusion on 09/17/2011, and bipolar disorder   PAIN:  Are you having pain? Yes NPRS:  Current:  3/10, Worst:  8/10, Best:  2/10 Location:  Medial R foot and plantar surface of foot  PRECAUTIONS: None  WEIGHT BEARING RESTRICTIONS:  No  FALLS:  Has patient fallen in last 6 months? No  LIVING ENVIRONMENT: Lives with: lives with their spouse Lives in: 2 story house Stairs: yes   OCCUPATION: Pt is retired  PLOF: Independent; Pt has chronic post tib pain though was able to perform her normal functional mobility including increased ambulation with less pain.  PATIENT GOALS: To be able to walk 1 mile and then as far as she wants to without brace.  Improved pain with walking.   NEXT MD VISIT:   OBJECTIVE:   DIAGNOSTIC FINDINGS: none recently   POSTURE: flat feet bilat   TODAY'S TREATMENT:                                                                                                                                Reviewed current function, HEP compliance, pain level, and response to prior Rx. Reviewed HEP. Pt performed: -marble pick up -plantar fascial rolling -ankle ABC x 1 reps -longsitting gastroc stretch 3x30 sec -Eccentric ankle inversion with YTB 3x10    Manual Therapy: IASTM to medial calf along post tib in long sitting to improve pain, reduce tightness and myofascial adhesions, and promote proper cross fiber alignment. STM to distal gastroc/soleus also.     PATIENT EDUCATION:  Education details:  PT answered pt's questions.  Educated pt concerning dx, relevant anatomy, HEP, and POC.  Pt instructed to ice ankle if she has increased pain or soreness later.     Person educated: Patient Education method: Explanation, Demonstration, Tactile cues, Verbal cues Education comprehension: verbalized understanding, returned demonstration, verbal cues required, tactile cues required, and needs further education  HOME EXERCISE PROGRAM: Access Code: [email or web address] URL: [email or web address] Date: 04/18/2022 Prepared by: Ronny Flurry  Exercises - Seated Ankle Alphabet  - 1-2 x daily - 7 x weekly - 1 reps - Long Sitting Ankle Inversion with Anchored Resistance  - 1 x daily - 5-6 x weekly - 3  sets - 10 reps  ASSESSMENT:  CLINICAL IMPRESSION: Pt has improved tolerance with exercises though has much difficulty with marble pick up.  Pt attempted marble pick up, but unable to pick up many with her toes.  Pt has tightness and tenderness in R sided post tib and R calf.  MT was performed gently due to tenderness and sensitivity.  Pt responded well to Rx stating she felt  better after Rx having reduced pain after Rx.  Pt should benefit from cont skilled PT services to address ongoing goals and to assist in restoring desired level of function.     OBJECTIVE IMPAIRMENTS: Abnormal gait, decreased activity tolerance, decreased mobility, difficulty walking, decreased ROM, decreased strength, hypomobility, increased fascial restrictions, impaired flexibility, and pain.   ACTIVITY LIMITATIONS: standing, stairs, and locomotion level  PARTICIPATION LIMITATIONS: shopping, community activity, and walking dog  PERSONAL FACTORS: 1 comorbidity: bilat knee pain and chronic pain  are also affecting patient's functional outcome.   REHAB POTENTIAL: Good  CLINICAL DECISION MAKING: Stable/uncomplicated  EVALUATION COMPLEXITY: Low   GOALS:   SHORT TERM GOALS: Target date:  05/09/2022  Pt will be independent and compliant with HEP for improved ROM, pain, strength, and function.  Baseline: Goal status: MET 05/07/22  2.  Pt will demo at least a 5-8 deg improvement in R ankle Inv and DF AROM for improved mobility, gait, and stiffness.   Baseline:  Goal status: INITIAL  3.  Pt will report at least a 25% improvement in pain with ambulation.  Baseline:  Goal status: INITIAL  4.  Pt will demo improved gait with improved Wb'ing thru and increased stance time on R LE.  Baseline:  Goal status: INITIAL   LONG TERM GOALS: Target date: 05/30/2022  Pt will be able to ambulate her normal distance without significant pain and difficulty.  Baseline:  Goal status: INITIAL  2.  Pt will be able to walk her  dog including on her drive way without significant pain and difficulty.  Baseline:  Goal status: INITIAL  3.  Pt will demo improved R inversion ankle strength and Post tib to 5/5 MMT for improved mobility with reduced pain and increased ease.  Baseline:  Goal status: INITIAL  4.  Pt will be able to perform stairs without significant ankle/foot pain.  Baseline:  Goal status: INITIAL     PLAN:  PT FREQUENCY: 2x/week  PT DURATION: 6 weeks  PLANNED INTERVENTIONS: Therapeutic exercises, Therapeutic activity, Neuromuscular re-education, Balance training, Gait training, Patient/Family education, Self Care, Joint mobilization, Stair training, Aquatic Therapy, Dry Needling, Electrical stimulation, Cryotherapy, Moist heat, Taping, Ultrasound, Manual therapy, and Re-evaluation  PLAN FOR NEXT SESSION: Review and perform HEP.  Ankle ROM, foot intrinsics, and post tib eccentrics.  IASTM to post tib.    Selinda Michaels III PT, DPT 05/10/22 2:57 PM

## 2022-05-14 ENCOUNTER — Ambulatory Visit (HOSPITAL_BASED_OUTPATIENT_CLINIC_OR_DEPARTMENT_OTHER): Payer: Medicare Other

## 2022-05-16 ENCOUNTER — Ambulatory Visit (HOSPITAL_BASED_OUTPATIENT_CLINIC_OR_DEPARTMENT_OTHER): Payer: Medicare Other

## 2022-05-21 ENCOUNTER — Ambulatory Visit (HOSPITAL_BASED_OUTPATIENT_CLINIC_OR_DEPARTMENT_OTHER): Payer: Medicare Other | Admitting: Physical Therapy

## 2022-05-23 ENCOUNTER — Encounter (HOSPITAL_BASED_OUTPATIENT_CLINIC_OR_DEPARTMENT_OTHER): Payer: Medicare Other | Admitting: Physical Therapy

## 2022-05-27 NOTE — Therapy (Signed)
OUTPATIENT PHYSICAL THERAPY TREATMENT NOTE   Patient Name: Andrea Paul MRN: [id number] DOB:Aug 02, 1955, 67 y.o., female Today's Date: 05/28/2022  END OF SESSION:  PT End of Session - 05/28/22 1225     Visit Number 5    Number of Visits 12    Date for PT Re-Evaluation 05/30/22    Authorization Type MCR A and B    PT Start Time 1115    PT Stop Time 1150    PT Time Calculation (min) 35 min    Activity Tolerance Patient tolerated treatment well    Behavior During Therapy WFL for tasks assessed/performed               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    Encounter for medical examination to establish care 02/13/2021   Flatulence, eructation and gas pain 07/11/2021   Heart murmur    History of blood transfusion    age 21 years old   History of periodontal disease    Pain in gums 01/16/2013   Post-nasal drip 02/13/2021   Pre-diabetes    Sleep apnea    no CPAP prescribed, sleeps on sides   Past Surgical History:  Procedure Laterality Date   ANTERIOR CERVICAL DECOMP/DISCECTOMY FUSION  09/17/2011   Procedure: ANTERIOR CERVICAL DECOMPRESSION/DISCECTOMY FUSION 2 LEVELS;  Surgeon: Hewitt Shorts, MD;  Location: [facility];  Service: Neurosurgery;  Laterality: Bilateral;  Cervical five-six, six-seven anterior cervical decompression with fusion plating and bonegraft   BASAL CELL CARCINOMA EXCISION  2010   COLONOSCOPY  2014   DILATATION & CURETTAGE/HYSTEROSCOPY WITH MYOSURE N/A 12/19/2015   Procedure: DILATATION & CURETTAGE/HYSTEROSCOPY WITH MYOSURE;  Surgeon: Jerene Bears, MD;  Location: [facility];  Service: Gynecology;  Laterality: N/A;   GANGLION CYST EXCISION     right wrist   TONSILLECTOMY      Patient Active Problem List   Diagnosis Date Noted   Posterior tibial tendonitis of right leg 04/09/2022   Spasm of diaphragm 12/07/2021   Abnormal weight gain 07/11/2021   Constipation 07/11/2021   Gastro-esophageal reflux disease with esophagitis 07/11/2021   Rectal bleeding 07/11/2021   Allergic rhinitis 02/21/2021   Atrophic vaginitis 02/21/2021   Chronic fatigue syndrome 02/21/2021   Hyperlipidemia 02/21/2021   Insomnia 02/21/2021   Moderate recurrent major depression (HCC) 02/21/2021   Obstructive sleep apnea syndrome 02/21/2021   Prediabetes 02/21/2021   Muscle spasms of neck 02/21/2021   Memory loss or impairment 02/13/2021   Fibroids, intramural 11/27/2015   Generalized anxiety disorder 10/03/2015   Bipolar 2 disorder (HCC) 03/25/2013   Gingival and periodontal disease 01/16/2013    REFERRING PROVIDER: Tollie Eth, NP  REFERRING DIAG: [phone] (ICD-10-CM) - Posterior tibial tendonitis of right leg  THERAPY DIAG:  Pain in right ankle and joints of right foot  Stiffness of right ankle, not elsewhere classified  Muscle weakness (generalized)  Difficulty in walking, not elsewhere classified  Rationale for Evaluation and Treatment: Rehabilitation  ONSET DATE: approx 03/28/22  SUBJECTIVE:   SUBJECTIVE STATEMENT: Pt states she has been very upset over the past 2 weeks due to the death of  her dog.  She hasn't been doing the exercises as much over the past 2 weeks.  Pt states she has been walking more and has improved pain with ambulation.  Pt states her foot felt like it was worked out last time, but a good workout.  Pt denies any increased pain after prior Rx and states she felt good after Rx.       PERTINENT HISTORY: Chronic patellofemoral pain of both knees.  MD note indicated x rays showed mild OA predominantly involving the patellofemoral joint Neck Spasm, cervical fusion on 09/17/2011, and bipolar disorder   PAIN:  Are you having pain? Yes NPRS:   Current:  1/10, Worst:  8/10, Best:  2/10 Location:  Medial R foot and plantar surface of foot  PRECAUTIONS: None  WEIGHT BEARING RESTRICTIONS: No  FALLS:  Has patient fallen in last 6 months? No  LIVING ENVIRONMENT: Lives with: lives with their spouse Lives in: 2 story house Stairs: yes   OCCUPATION: Pt is retired  PLOF: Independent; Pt has chronic post tib pain though was able to perform her normal functional mobility including increased ambulation with less pain.  PATIENT GOALS: To be able to walk 1 mile and then as far as she wants to without brace.  Improved pain with walking.   NEXT MD VISIT:   OBJECTIVE:   DIAGNOSTIC FINDINGS: none recently   POSTURE: flat feet bilat   TODAY'S TREATMENT:                                                                                                                                Reviewed current function, HEP compliance, pain level, and response to prior Rx. Reviewed HEP. Pt performed: -towel scrunches x 3 reps -plantar fascial rolling -ankle ABC x 1 reps -longsitting gastroc stretch 3x30 sec -Eccentric ankle inversion with RTB 3x10    Manual Therapy: IASTM to medial calf along post tib in long sitting to improve pain, reduce tightness and myofascial adhesions, and promote proper cross fiber alignment. STM to distal gastroc/soleus also.     PATIENT EDUCATION:  Education details:  PT answered pt's questions.  Educated pt concerning dx, relevant anatomy, HEP, and POC.  Pt instructed to ice ankle if she has increased pain or soreness later.     Person educated: Patient Education method: Explanation, Demonstration, Tactile cues, Verbal cues Education comprehension: verbalized understanding, returned demonstration, verbal cues required, tactile cues required, and needs further education  HOME EXERCISE PROGRAM: Access Code: [email or web address] URL: [email or web address] Date: 04/18/2022 Prepared by: Aaron Edelman  Exercises - Seated Ankle Alphabet  - 1-2 x daily - 7 x weekly - 1 reps - Long Sitting Ankle Inversion with Anchored Resistance  - 1 x daily - 5-6 x weekly - 3 sets - 10 reps  ASSESSMENT:  CLINICAL IMPRESSION: Pt presents to Rx doing better and reporting improved pain with ambulation.  Pt performed exercises well.  She demonstrates improved intrinsic foot  strength and inversion strength as evidenced by performance of towel scrunches and eccentric inversion with RTB respectively.  Pt continues to be sensitive and tender with IASTM though is improving.  She responded well to Rx and reports minimally increased pain to 2/10 after Rx.  Pt should benefit from cont skilled PT services to address ongoing goals and to assist in restoring desired level of function.     OBJECTIVE IMPAIRMENTS: Abnormal gait, decreased activity tolerance, decreased mobility, difficulty walking, decreased ROM, decreased strength, hypomobility, increased fascial restrictions, impaired flexibility, and pain.   ACTIVITY LIMITATIONS: standing, stairs, and locomotion level  PARTICIPATION LIMITATIONS: shopping, community activity, and walking dog  PERSONAL FACTORS: 1 comorbidity: bilat knee pain and chronic pain  are also affecting patient's functional outcome.   REHAB POTENTIAL: Good  CLINICAL DECISION MAKING: Stable/uncomplicated  EVALUATION COMPLEXITY: Low   GOALS:   SHORT TERM GOALS: Target date:  05/09/2022  Pt will be independent and compliant with HEP for improved ROM, pain, strength, and function.  Baseline: Goal status: MET 05/07/22  2.  Pt will demo at least a 5-8 deg improvement in R ankle Inv and DF AROM for improved mobility, gait, and stiffness.   Baseline:  Goal status: INITIAL  3.  Pt will report at least a 25% improvement in pain with ambulation.  Baseline:  Goal status: INITIAL  4.  Pt will demo improved gait with improved Wb'ing thru and increased stance time on R LE.  Baseline:   Goal status: INITIAL   LONG TERM GOALS: Target date: 05/30/2022  Pt will be able to ambulate her normal distance without significant pain and difficulty.  Baseline:  Goal status: INITIAL  2.  Pt will be able to walk her dog including on her drive way without significant pain and difficulty.  Baseline:  Goal status: INITIAL  3.  Pt will demo improved R inversion ankle strength and Post tib to 5/5 MMT for improved mobility with reduced pain and increased ease.  Baseline:  Goal status: INITIAL  4.  Pt will be able to perform stairs without significant ankle/foot pain.  Baseline:  Goal status: INITIAL     PLAN:  PT FREQUENCY: 2x/week  PT DURATION: 6 weeks  PLANNED INTERVENTIONS: Therapeutic exercises, Therapeutic activity, Neuromuscular re-education, Balance training, Gait training, Patient/Family education, Self Care, Joint mobilization, Stair training, Aquatic Therapy, Dry Needling, Electrical stimulation, Cryotherapy, Moist heat, Taping, Ultrasound, Manual therapy, and Re-evaluation  PLAN FOR NEXT SESSION:  Ankle ROM, foot intrinsics, and post tib eccentrics.  IASTM to post tib.    Audie Clear III PT, DPT 05/28/22 12:46 PM '

## 2022-05-28 ENCOUNTER — Ambulatory Visit (HOSPITAL_BASED_OUTPATIENT_CLINIC_OR_DEPARTMENT_OTHER): Payer: Medicare Other | Attending: Nurse Practitioner | Admitting: Physical Therapy

## 2022-05-28 ENCOUNTER — Encounter (HOSPITAL_BASED_OUTPATIENT_CLINIC_OR_DEPARTMENT_OTHER): Payer: Self-pay | Admitting: Physical Therapy

## 2022-05-28 DIAGNOSIS — M25571 Pain in right ankle and joints of right foot: Secondary | ICD-10-CM | POA: Insufficient documentation

## 2022-05-28 DIAGNOSIS — M25671 Stiffness of right ankle, not elsewhere classified: Secondary | ICD-10-CM | POA: Insufficient documentation

## 2022-05-28 DIAGNOSIS — M6281 Muscle weakness (generalized): Secondary | ICD-10-CM | POA: Diagnosis present

## 2022-05-28 DIAGNOSIS — R262 Difficulty in walking, not elsewhere classified: Secondary | ICD-10-CM | POA: Insufficient documentation

## 2022-05-30 ENCOUNTER — Encounter (HOSPITAL_BASED_OUTPATIENT_CLINIC_OR_DEPARTMENT_OTHER): Payer: Self-pay | Admitting: Physical Therapy

## 2022-05-30 ENCOUNTER — Ambulatory Visit (HOSPITAL_BASED_OUTPATIENT_CLINIC_OR_DEPARTMENT_OTHER): Payer: Medicare Other | Admitting: Physical Therapy

## 2022-05-30 DIAGNOSIS — M25571 Pain in right ankle and joints of right foot: Secondary | ICD-10-CM | POA: Diagnosis not present

## 2022-05-30 DIAGNOSIS — R262 Difficulty in walking, not elsewhere classified: Secondary | ICD-10-CM

## 2022-05-30 DIAGNOSIS — M25671 Stiffness of right ankle, not elsewhere classified: Secondary | ICD-10-CM

## 2022-05-30 DIAGNOSIS — M6281 Muscle weakness (generalized): Secondary | ICD-10-CM

## 2022-05-30 NOTE — Therapy (Signed)
OUTPATIENT PHYSICAL THERAPY TREATMENT NOTE   Patient Name: Andrea Paul MRN: [id number] DOB:1955/10/21, 67 y.o., female Today's Date: 05/31/2022    END OF SESSION:   PT End of Session - 05/30/22        Visit Number 6    Number of Visits 12     Date for PT Re-Evaluation 05/30/22     Authorization Type MCR A and B     PT Start Time 1027    PT Stop Time 1106    PT Time Calculation (min) 39 min     Activity Tolerance Patient tolerated treatment well     Behavior During Therapy WFL for tasks assessed/performed            Past Medical History:  Diagnosis Date   Anemia    history of   Anxiety    does not take daily unless needed   Arthritis    bilateral hands   Basal cell carcinoma    Bilateral hearing loss due to cerumen impaction 02/13/2021   Bipolar 1 disorder ([place])        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    Encounter for medical examination to establish care 02/13/2021   Flatulence, eructation and gas pain 07/11/2021   Heart murmur    History of blood transfusion    age 25 years old   History of periodontal disease    Pain in gums 01/16/2013   Post-nasal drip 02/13/2021   Pre-diabetes    Sleep apnea    no CPAP prescribed, sleeps on sides   Past Surgical History:  Procedure Laterality Date   ANTERIOR CERVICAL DECOMP/DISCECTOMY FUSION  09/17/2011   Procedure: ANTERIOR CERVICAL DECOMPRESSION/DISCECTOMY FUSION 2 LEVELS;  Surgeon: Hosie Spangle, MD;  Location: [facility];  Service: Neurosurgery;  Laterality: Bilateral;  Cervical five-six, six-seven anterior cervical decompression with fusion plating and bonegraft   BASAL CELL CARCINOMA EXCISION  2010   COLONOSCOPY  2014   DILATATION & CURETTAGE/HYSTEROSCOPY WITH MYOSURE N/A 12/19/2015   Procedure: [place];  Surgeon: Megan Salon, MD;  Location: [facility];  Service: Gynecology;  Laterality: N/A;   GANGLION CYST EXCISION     right wrist   TONSILLECTOMY      Patient Active Problem List   Diagnosis Date Noted   Posterior tibial tendonitis of right leg 04/09/2022   Spasm of diaphragm 12/07/2021   Abnormal weight gain 07/11/2021   Constipation 07/11/2021   Gastro-esophageal reflux disease with esophagitis 07/11/2021   Rectal bleeding 07/11/2021   Allergic rhinitis 02/21/2021   Atrophic vaginitis 02/21/2021   Chronic fatigue syndrome 02/21/2021   Hyperlipidemia 02/21/2021   Insomnia 02/21/2021   Moderate recurrent major depression ([place]) 02/21/2021   Obstructive sleep apnea syndrome 02/21/2021   Prediabetes 02/21/2021   Muscle spasms of neck 02/21/2021   Memory loss or impairment 02/13/2021   Fibroids, intramural 11/27/2015   Generalized anxiety disorder 10/03/2015   Bipolar 2 disorder ([place]) 03/25/2013   Gingival and periodontal disease 01/16/2013    REFERRING PROVIDER: Orma Render, NP  REFERRING DIAG: [id number] (ICD-10-CM) - Posterior tibial tendonitis of right leg  THERAPY DIAG:  Pain in right ankle and joints of right foot  Stiffness of right ankle, not elsewhere classified  Muscle weakness (generalized)  Difficulty in walking, not elsewhere classified  Rationale for Evaluation and Treatment: Rehabilitation  ONSET DATE: approx 03/28/22  SUBJECTIVE:   SUBJECTIVE STATEMENT: Pt performed her HEP yesterday.  Pt states she felt  fine after prior Rx.    s she has been very upset over the past 2 weeks due to the death of her dog.  She hasn't been doing the exercises as much over the past 2 weeks.  Pt states she has been walking more and has improved pain with ambulation.  Pt states her foot felt like it was worked out last time, but a good workout.  Pt denies any increased pain after prior Rx and states she felt good after Rx.       PERTINENT HISTORY: Chronic patellofemoral pain of both knees.  MD note indicated x rays showed mild OA predominantly involving the patellofemoral joint Neck Spasm, cervical fusion on 09/17/2011, and  bipolar disorder   PAIN:  Are you having pain? Yes NPRS:  Current:  1/10, Worst:  8/10, Best:  2/10 Location:  Medial R foot and plantar surface of foot  PRECAUTIONS: None  WEIGHT BEARING RESTRICTIONS: No  FALLS:  Has patient fallen in last 6 months? No  LIVING ENVIRONMENT: Lives with: lives with their spouse Lives in: 2 story house Stairs: yes   OCCUPATION: Pt is retired  PLOF: Independent; Pt has chronic post tib pain though was able to perform her normal functional mobility including increased ambulation with less pain.  PATIENT GOALS: To be able to walk 1 mile and then as far as she wants to without brace.  Improved pain with walking.   NEXT MD VISIT:   OBJECTIVE:   DIAGNOSTIC FINDINGS: none recently   POSTURE: flat feet bilat   TODAY'S TREATMENT:                                                                                                                                Reviewed current function, HEP compliance, pain level, and response to prior Rx. Reviewed HEP. Pt performed: -towel scrunches x 3 reps -plantar fascial rolling -longsitting gastroc stretch 3x30 sec -Eccentric ankle inversion with RTB 3x10 -marching on airex with light UE support 2x10 -Standing heel raises 2x10 -step up on airex 2x10 R LE    Manual Therapy: IASTM to medial calf along post tib in long sitting to improve pain, reduce tightness and myofascial adhesions, and promote proper cross fiber alignment. STM to to R calf also.     PATIENT EDUCATION:  Education details:  PT answered pt's questions.  Educated pt concerning dx, relevant anatomy, HEP, and POC.  Pt instructed to ice ankle if she has increased pain or soreness later.     Person educated: Patient Education method: Explanation, Demonstration, Tactile cues, Verbal cues Education comprehension: verbalized understanding, returned demonstration, verbal cues required, tactile cues required, and needs further education  HOME  EXERCISE PROGRAM: Access Code: [email or web address] URL: [email or web address] Date: 04/18/2022 Prepared by: Ronny Flurry  Exercises - Seated Ankle Alphabet  - 1-2 x daily - 7 x weekly - 1 reps - Long Sitting Ankle Inversion with Anchored Resistance  - 1 x daily -  5-6 x weekly - 3 sets - 10 reps  ASSESSMENT:  CLINICAL IMPRESSION: Pt demonstrates improved intrinsic foot strength as evidenced by performance of towel scrunches.  PT progressed exercises with increased closed chain activities and pt tolerated progression well.  Pt continues to be sensitive and tender with IASTM though is improving.  She responded well to Rx having no c/o's after Rx.  Pt should benefit from cont skilled PT services to address ongoing goals and to assist in restoring desired level of function.     OBJECTIVE IMPAIRMENTS: Abnormal gait, decreased activity tolerance, decreased mobility, difficulty walking, decreased ROM, decreased strength, hypomobility, increased fascial restrictions, impaired flexibility, and pain.   ACTIVITY LIMITATIONS: standing, stairs, and locomotion level  PARTICIPATION LIMITATIONS: shopping, community activity, and walking dog  PERSONAL FACTORS: 1 comorbidity: bilat knee pain and chronic pain  are also affecting patient's functional outcome.   REHAB POTENTIAL: Good  CLINICAL DECISION MAKING: Stable/uncomplicated  EVALUATION COMPLEXITY: Low   GOALS:   SHORT TERM GOALS: Target date:  05/09/2022  Pt will be independent and compliant with HEP for improved ROM, pain, strength, and function.  Baseline: Goal status: MET 05/07/22  2.  Pt will demo at least a 5-8 deg improvement in R ankle Inv and DF AROM for improved mobility, gait, and stiffness.   Baseline:  Goal status: INITIAL  3.  Pt will report at least a 25% improvement in pain with ambulation.  Baseline:  Goal status: INITIAL  4.  Pt will demo improved gait with improved Wb'ing thru and increased stance time on R LE.   Baseline:  Goal status: INITIAL   LONG TERM GOALS: Target date: 05/30/2022  Pt will be able to ambulate her normal distance without significant pain and difficulty.  Baseline:  Goal status: INITIAL  2.  Pt will be able to walk her dog including on her drive way without significant pain and difficulty.  Baseline:  Goal status: INITIAL  3.  Pt will demo improved R inversion ankle strength and Post tib to 5/5 MMT for improved mobility with reduced pain and increased ease.  Baseline:  Goal status: INITIAL  4.  Pt will be able to perform stairs without significant ankle/foot pain.  Baseline:  Goal status: INITIAL     PLAN:  PT FREQUENCY: 2x/week  PT DURATION: 6 weeks  PLANNED INTERVENTIONS: Therapeutic exercises, Therapeutic activity, Neuromuscular re-education, Balance training, Gait training, Patient/Family education, Self Care, Joint mobilization, Stair training, Aquatic Therapy, Dry Needling, Electrical stimulation, Cryotherapy, Moist heat, Taping, Ultrasound, Manual therapy, and Re-evaluation  PLAN FOR NEXT SESSION:  Ankle ROM, foot intrinsics, and post tib eccentrics.  IASTM to post tib.  PN next visit.   Selinda Michaels III PT, DPT 05/31/22 9:25 PM  '

## 2022-06-04 ENCOUNTER — Encounter (HOSPITAL_BASED_OUTPATIENT_CLINIC_OR_DEPARTMENT_OTHER): Payer: Medicare Other | Admitting: Physical Therapy

## 2022-06-06 ENCOUNTER — Encounter (HOSPITAL_BASED_OUTPATIENT_CLINIC_OR_DEPARTMENT_OTHER): Payer: Medicare Other | Admitting: Physical Therapy

## 2022-06-07 ENCOUNTER — Telehealth: Payer: Self-pay | Admitting: Nurse Practitioner

## 2022-06-07 ENCOUNTER — Encounter (HOSPITAL_BASED_OUTPATIENT_CLINIC_OR_DEPARTMENT_OTHER): Payer: Medicare Other | Admitting: Physical Therapy

## 2022-06-07 NOTE — Telephone Encounter (Signed)
Contacted Andrea Paul to schedule their annual wellness visit. Appointment made for 06/12/22.  Andrea Paul AWV direct phone # [phone]

## 2022-06-11 ENCOUNTER — Ambulatory Visit (HOSPITAL_BASED_OUTPATIENT_CLINIC_OR_DEPARTMENT_OTHER): Payer: Medicare Other

## 2022-06-12 ENCOUNTER — Ambulatory Visit: Payer: Medicare Other

## 2022-06-13 ENCOUNTER — Ambulatory Visit (HOSPITAL_BASED_OUTPATIENT_CLINIC_OR_DEPARTMENT_OTHER): Payer: Medicare Other | Admitting: Physical Therapy

## 2022-06-13 DIAGNOSIS — M25571 Pain in right ankle and joints of right foot: Secondary | ICD-10-CM | POA: Diagnosis not present

## 2022-06-13 DIAGNOSIS — M25671 Stiffness of right ankle, not elsewhere classified: Secondary | ICD-10-CM

## 2022-06-13 DIAGNOSIS — M6281 Muscle weakness (generalized): Secondary | ICD-10-CM

## 2022-06-13 DIAGNOSIS — R262 Difficulty in walking, not elsewhere classified: Secondary | ICD-10-CM

## 2022-06-13 NOTE — Therapy (Signed)
OUTPATIENT PHYSICAL THERAPY TREATMENT NOTE   Patient Name: Andrea Paul MRN: [id number] DOB:1956-02-07, 67 y.o., female Today's Date: 06/14/2022    END OF SESSION:   PT End of Session - 06/13/22 1120     Visit Number 7    Number of Visits 8    Date for PT Re-Evaluation 06/28/22    Authorization Type MCR A and B    PT Start Time 1115    PT Stop Time 1153    PT Time Calculation (min) 38 min    Activity Tolerance Patient tolerated treatment well;No increased pain    Behavior During Therapy WFL for tasks assessed/performed                Past Medical History:  Diagnosis Date   Anemia    history of   Anxiety    does not take daily unless needed   Arthritis    bilateral hands   Basal cell carcinoma    Bilateral hearing loss due to cerumen impaction 02/13/2021   Bipolar 1 disorder ([place])        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    Encounter for medical examination to establish care 02/13/2021   Flatulence, eructation and gas pain 07/11/2021   Heart murmur    History of blood transfusion    age 67 years old   History of periodontal disease    Pain in gums 01/16/2013   Post-nasal drip 02/13/2021   Pre-diabetes    Sleep apnea    no CPAP prescribed, sleeps on sides   Past Surgical History:  Procedure Laterality Date   ANTERIOR CERVICAL DECOMP/DISCECTOMY FUSION  09/17/2011   Procedure: ANTERIOR CERVICAL DECOMPRESSION/DISCECTOMY FUSION 2 LEVELS;  Surgeon: Hosie Spangle, MD;  Location: [facility];  Service: Neurosurgery;  Laterality: Bilateral;  Cervical five-six, six-seven anterior cervical decompression with fusion plating and bonegraft   BASAL CELL CARCINOMA EXCISION  2010   COLONOSCOPY  2014   DILATATION & CURETTAGE/HYSTEROSCOPY WITH MYOSURE N/A 12/19/2015   Procedure: [place];  Surgeon: Megan Salon, MD;  Location: [facility];  Service: Gynecology;  Laterality: N/A;   GANGLION CYST EXCISION     right wrist    TONSILLECTOMY     Patient Active Problem List   Diagnosis Date Noted   Posterior tibial tendonitis of right leg 04/09/2022   Spasm of diaphragm 12/07/2021   Abnormal weight gain 07/11/2021   Constipation 07/11/2021   Gastro-esophageal reflux disease with esophagitis 07/11/2021   Rectal bleeding 07/11/2021   Allergic rhinitis 02/21/2021   Atrophic vaginitis 02/21/2021   Chronic fatigue syndrome 02/21/2021   Hyperlipidemia 02/21/2021   Insomnia 02/21/2021   Moderate recurrent major depression ([place]) 02/21/2021   Obstructive sleep apnea syndrome 02/21/2021   Prediabetes 02/21/2021   Muscle spasms of neck 02/21/2021   Memory loss or impairment 02/13/2021   Fibroids, intramural 11/27/2015   Generalized anxiety disorder 10/03/2015   Bipolar 2 disorder ([place]) 03/25/2013   Gingival and periodontal disease 01/16/2013    REFERRING PROVIDER: Orma Render, NP  REFERRING DIAG: [phone] (ICD-10-CM) - Posterior tibial tendonitis of right leg  THERAPY DIAG:  Pain in right ankle and joints of right foot  Stiffness of right ankle, not elsewhere classified  Muscle weakness (generalized)  Difficulty in walking, not elsewhere classified  Rationale for Evaluation and Treatment: Rehabilitation  ONSET DATE: approx 03/28/22  SUBJECTIVE:   SUBJECTIVE STATEMENT: Pt denies any increased pain after prior Rx and states she  felt good after Rx.     HEP COMPLIANCE:  Pt reports compliance with HEP.  Pt reports having soreness with ankle inv though states she is squeezing.   FUNCTIONAL IMPROVEMENTS:  "I have no pain with walking".  Pt able to walk 1 mile without pain.  Pt reports 100% improvement in pain with ambulation.  Pt able to perform stairs without pain.     PERTINENT HISTORY: Chronic patellofemoral pain of both knees.  MD note indicated x rays showed mild OA predominantly involving the patellofemoral joint Neck Spasm, cervical fusion on 09/17/2011, and bipolar disorder   PAIN:  NPRS:   Current:  0/10, Worst:  8/10, Best:  0/10 Location:  Medial R foot and plantar surface of foot  PRECAUTIONS: None  WEIGHT BEARING RESTRICTIONS: No  FALLS:  Has patient fallen in last 6 months? No  LIVING ENVIRONMENT: Lives with: lives with their spouse Lives in: 2 story house Stairs: yes   OCCUPATION: Pt is retired  PLOF: Independent; Pt has chronic post tib pain though was able to perform her normal functional mobility including increased ambulation with less pain.  PATIENT GOALS: To be able to walk 1 mile and then as far as she wants to without brace.  Improved pain with walking.   NEXT MD VISIT:   OBJECTIVE:   DIAGNOSTIC FINDINGS: none recently   POSTURE: flat feet bilat   TODAY'S TREATMENT:                                                                                                                                Physical Performance testing:  Reviewed current function, response to prior Rx, HEP compliance, and pain levels.  Assessed Gait, stairs, ROM, and strength  R ankle DF AROM:  17 deg  (Initial 7)  R ankle strength: 5/5 in inv, eve, and post tib  Gait:  Pt ambulates with improved Wb'ing thru R LE.  She has = stance time bilat.  Pt has no limp with gait.   Stairs:  Pt ascended and descended stairs with a reciprocal gait with rail with good control and without pain.   FOTO: initial/current:  55 / 69. goal 62  Therapeutic Exercise:  PT reviewed HEP and updated HEP.  Pt received a HEP handout and was educated in correct form and appropriate frequency.   Manual Therapy: STM to medial calf along post tib in long sitting to improve pain, reduce tightness and myofascial adhesions, and promote proper cross fiber alignment. STM to to R calf also.       PATIENT EDUCATION:  Education details:  PT answered pt's questions.  Educated pt concerning dx, relevant anatomy, HEP, and POC.  Pt instructed to ice ankle if she has increased pain or soreness later.      Person educated: Patient Education method: Explanation, Demonstration, Tactile cues, Verbal cues Education comprehension: verbalized understanding, returned demonstration, verbal cues required, tactile cues required, and needs further education  HOME EXERCISE PROGRAM: Access Code: [email or web address] URL: [email or web address] Date: 04/18/2022 Prepared by: Ronny Flurry  Exercises - Seated Ankle Alphabet  - 1-2 x daily - 7 x weekly - 1 reps - Long Sitting Ankle Inversion with Anchored Resistance  - 1 x daily - 5-6 x weekly - 3 sets - 10 reps  Updated HEP: - Seated Toe Towel Scrunches  - 1 x daily - 7 x weekly - 1 sets - 5 reps - Long Sitting Calf Stretch with Strap  - 2 x daily - 7 x weekly - 2-3 reps - 20-30 seconds hold  ASSESSMENT:  CLINICAL IMPRESSION: Pt has made excellent progress in PT.  She presents without any pain.  Pt states she has no pain with ambulation.  Pt has no limp with gait and has = Wb'ing in bilat LE's.  Pt is able to perform stairs with a reciprocal gait with rail with good control without pain.  Pt demonstrates improved ankle DF AROM.  She has improved ankle strength to 5/5 in post tib, Inv, and Eve.  Pt continues to have tenderness with STW though has much improved soft tissue mobility in post tib and medial LE.  Pt demonstrates improved self perceived disability with FOTO score improving from 47 to 69 currently.  Pt has met all goals.  PT updated LTG with advanced HEP.  Pt will benefit from 1 more visit to ensure independence with advanced HEP.     OBJECTIVE IMPAIRMENTS: Abnormal gait, decreased activity tolerance, decreased mobility, difficulty walking, decreased ROM, decreased strength, hypomobility, increased fascial restrictions, impaired flexibility, and pain.   ACTIVITY LIMITATIONS: standing, stairs, and locomotion level  PARTICIPATION LIMITATIONS: shopping, community activity, and walking dog  PERSONAL FACTORS: 1 comorbidity: bilat knee pain and  chronic pain  are also affecting patient's functional outcome.   REHAB POTENTIAL: Good  CLINICAL DECISION MAKING: Stable/uncomplicated  EVALUATION COMPLEXITY: Low   GOALS:   SHORT TERM GOALS: Target date:  05/09/2022  Pt will be independent and compliant with HEP for improved ROM, pain, strength, and function.  Baseline: Goal status: MET 05/07/22  2.  Pt will demo at least a 5-8 deg improvement in R ankle Inv and DF AROM for improved mobility, gait, and stiffness.   Baseline:  Goal status: GOAL MET  3.  Pt will report at least a 25% improvement in pain with ambulation.  Baseline:  Goal status: GOAL MET  4.  Pt will demo improved gait with improved Wb'ing thru and increased stance time on R LE.  Baseline:  Goal status: GOAL MET   LONG TERM GOALS: Target date: 05/30/2022  Pt will be able to ambulate her normal distance without significant pain and difficulty.  Baseline:  Goal status: GOAL MET  2.  Pt will be able to walk her dog including on her drive way without significant pain and difficulty.  Baseline:  Goal status: DISCONTINUED due to her dog passing away.   3.  Pt will demo improved R inversion ankle strength and Post tib to 5/5 MMT for improved mobility with reduced pain and increased ease.  Baseline:  Goal status: GOAL MET  4.  Pt will be able to perform stairs without significant ankle/foot pain.  Baseline:  Goal status: GOAL MET  5.  Pt will be independent with advanced HEP in order to establish long term HEP for strength, function, and mobility.   Goal status:  INITIAL  Target date:   06/27/2022     PLAN:  PT FREQUENCY: 1 visit  PT DURATION: 2 weeks  PLANNED INTERVENTIONS: Therapeutic exercises, Therapeutic activity, Neuromuscular re-education, Balance training, Gait training, Patient/Family education, Self Care, Joint mobilization, Stair training, Aquatic Therapy, Dry Needling, Electrical stimulation, Cryotherapy, Moist heat, Taping, Ultrasound,  Manual therapy, and Re-evaluation  PLAN FOR NEXT SESSION:  Pt to be seen for 1 more visit to ensure independence with advanced HEP.    Selinda Michaels III PT, DPT 06/14/22 2:08 PM   '

## 2022-06-14 ENCOUNTER — Encounter (HOSPITAL_BASED_OUTPATIENT_CLINIC_OR_DEPARTMENT_OTHER): Payer: Self-pay | Admitting: Physical Therapy

## 2022-06-19 ENCOUNTER — Encounter (HOSPITAL_BASED_OUTPATIENT_CLINIC_OR_DEPARTMENT_OTHER): Payer: Self-pay | Admitting: Physical Therapy

## 2022-06-19 ENCOUNTER — Ambulatory Visit (HOSPITAL_BASED_OUTPATIENT_CLINIC_OR_DEPARTMENT_OTHER): Payer: Medicare Other | Attending: Nurse Practitioner | Admitting: Physical Therapy

## 2022-06-19 DIAGNOSIS — R262 Difficulty in walking, not elsewhere classified: Secondary | ICD-10-CM | POA: Insufficient documentation

## 2022-06-19 DIAGNOSIS — M6281 Muscle weakness (generalized): Secondary | ICD-10-CM | POA: Diagnosis present

## 2022-06-19 DIAGNOSIS — M25571 Pain in right ankle and joints of right foot: Secondary | ICD-10-CM | POA: Diagnosis present

## 2022-06-19 DIAGNOSIS — M25671 Stiffness of right ankle, not elsewhere classified: Secondary | ICD-10-CM

## 2022-06-19 NOTE — Therapy (Signed)
OUTPATIENT PHYSICAL THERAPY TREATMENT NOTE   Patient Name: Andrea Paul MRN: [id number] DOB:[birth year], 67 y.o., female Today's Date: 06/19/2022    END OF SESSION:   PT End of Session - 06/19/22 1159     Visit Number 8    Number of Visits 8    Authorization Type MCR A and B    PT Start Time 1152                Past Medical History:  Diagnosis Date   Anemia    history of   Anxiety    does not take daily unless needed   Arthritis    bilateral hands   Basal cell carcinoma    Bilateral hearing loss due to cerumen impaction 02/13/2021   Bipolar 1 disorder        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    Encounter for medical examination to establish care 02/13/2021   Flatulence, eructation and gas pain 07/11/2021   Heart murmur    History of blood transfusion    age 47 years old   History of periodontal disease    Pain in gums 01/16/2013   Post-nasal drip 02/13/2021   Pre-diabetes    Sleep apnea    no CPAP prescribed, sleeps on sides   Past Surgical History:  Procedure Laterality Date   ANTERIOR CERVICAL DECOMP/DISCECTOMY FUSION  09/17/2011   Procedure: ANTERIOR CERVICAL DECOMPRESSION/DISCECTOMY FUSION 2 LEVELS;  Surgeon: Hosie Spangle, MD;  Location: [facility];  Service: Neurosurgery;  Laterality: Bilateral;  Cervical five-six, six-seven anterior cervical decompression with fusion plating and bonegraft   BASAL CELL CARCINOMA EXCISION  2010   COLONOSCOPY  2014   DILATATION & CURETTAGE/HYSTEROSCOPY WITH MYOSURE N/A 12/19/2015   Procedure: [place];  Surgeon: Megan Salon, MD;  Location: [facility];  Service: Gynecology;  Laterality: N/A;   GANGLION CYST EXCISION     right wrist   TONSILLECTOMY     Patient Active Problem List   Diagnosis Date Noted   Posterior tibial tendonitis of right leg 04/09/2022   Spasm of diaphragm 12/07/2021   Abnormal weight gain 07/11/2021   Constipation 07/11/2021    Gastro-esophageal reflux disease with esophagitis 07/11/2021   Rectal bleeding 07/11/2021   Allergic rhinitis 02/21/2021   Atrophic vaginitis 02/21/2021   Chronic fatigue syndrome 02/21/2021   Hyperlipidemia 02/21/2021   Insomnia 02/21/2021   Moderate recurrent major depression 02/21/2021   Obstructive sleep apnea syndrome 02/21/2021   Prediabetes 02/21/2021   Muscle spasms of neck 02/21/2021   Memory loss or impairment 02/13/2021   Fibroids, intramural 11/27/2015   Generalized anxiety disorder 10/03/2015   Bipolar 2 disorder 03/25/2013   Gingival and periodontal disease 01/16/2013    REFERRING PROVIDER: Orma Render, NP  REFERRING DIAG: [phone] (ICD-10-CM) - Posterior tibial tendonitis of right leg  THERAPY DIAG:  No diagnosis found.  Rationale for Evaluation and Treatment: Rehabilitation  ONSET DATE: approx 03/28/22  SUBJECTIVE:   SUBJECTIVE STATEMENT: Pt denies any adverse effects after prior Rx.  Pt states she feels like she did last week.  She had no changes.  Pt able to walk 1 mile without pain.  Pt reports 100% improvement in pain with ambulation.  Pt able to perform stairs without pain.      HEP COMPLIANCE:  Pt reports compliance with HEP.   Pt has been performing eccentric inversion without squeezing and feels fine.      PERTINENT HISTORY: Chronic patellofemoral  pain of both knees.  MD note indicated x rays showed mild OA predominantly involving the patellofemoral joint Neck Spasm, cervical fusion on 09/17/2011, and bipolar disorder   PAIN:  NPRS:  Current:  0/10, Worst:  8/10, Best:  0/10 Location:  Medial R foot and plantar surface of foot  PRECAUTIONS: None  WEIGHT BEARING RESTRICTIONS: No  FALLS:  Has patient fallen in last 6 months? No  LIVING ENVIRONMENT: Lives with: lives with their spouse Lives in: 2 story house Stairs: yes   OCCUPATION: Pt is retired  PLOF: Independent; Pt has chronic post tib pain though was able to perform her normal  functional mobility including increased ambulation with less pain.  PATIENT GOALS: To be able to walk 1 mile and then as far as she wants to without brace.  Improved pain with walking.   NEXT MD VISIT:   OBJECTIVE:   DIAGNOSTIC FINDINGS: none recently   POSTURE: flat feet bilat   TODAY'S TREATMENT:                                                                                                                                Physical Performance testing:  Reviewed current function, response to prior Rx, HEP compliance, and pain levels.    Therapeutic Exercise:  PT reviewed HEP and updated HEP.  Pt received a HEP handout and was educated in correct form and appropriate frequency.   Pt performed: -Eccentric ankle inversion with RTB 3x10 -marching on airex with light UE support 2x10 -Standing heel raises 3x10 -step up on airex 2x10 R LE -longsitting gastroc stretch 3x30 sec  Improved STM        PATIENT EDUCATION:  Education details:  PT answered pt's questions.  Educated pt concerning dx, relevant anatomy, HEP, and POC.  Pt instructed to ice ankle if she has increased pain or soreness later.     Person educated: Patient Education method: Explanation, Demonstration, Tactile cues, Verbal cues Education comprehension: verbalized understanding, returned demonstration, verbal cues required, tactile cues required, and needs further education  HOME EXERCISE PROGRAM: Access Code: [email or web address] URL: [email or web address] Date: 04/18/2022 Prepared by: Ronny Flurry  Exercises - Seated Ankle Alphabet  - 1-2 x daily - 7 x weekly - 1 reps - Long Sitting Ankle Inversion with Anchored Resistance  - 1 x daily - 5-6 x weekly - 3 sets - 10 reps - Seated Toe Towel Scrunches  - 1 x daily - 7 x weekly - 1 sets - 5 reps - Long Sitting Calf Stretch with Strap  - 2 x daily - 7 x weekly - 2-3 reps - 20-30 seconds hold  Updated HEP: - Heel Raises with Counter Support  - 1 x daily  - 5 x weekly - 2-3 sets - 10 reps -Step ups on airex 2x10  4x/wk  ASSESSMENT:  CLINICAL IMPRESSION: Pt has made excellent progress in PT.  She presents without any pain.  Pt states she has  no pain with ambulation.  Pt has no limp with gait and has = Wb'ing in bilat LE's.  Pt is able to perform stairs with a reciprocal gait with rail with good control without pain.  Pt demonstrates improved ankle DF AROM.  She has improved ankle strength to 5/5 in post tib, Inv, and Eve.  Pt continues to have tenderness with STW though has much improved soft tissue mobility in post tib and medial LE.  Pt demonstrates improved self perceived disability with FOTO score improving from 47 to 69 currently.  Pt has met all goals.  PT updated LTG with advanced HEP.  Pt will benefit from 1 more visit to ensure independence with advanced HEP.     OBJECTIVE IMPAIRMENTS: Abnormal gait, decreased activity tolerance, decreased mobility, difficulty walking, decreased ROM, decreased strength, hypomobility, increased fascial restrictions, impaired flexibility, and pain.   ACTIVITY LIMITATIONS: standing, stairs, and locomotion level  PARTICIPATION LIMITATIONS: shopping, community activity, and walking dog  PERSONAL FACTORS: 1 comorbidity: bilat knee pain and chronic pain  are also affecting patient's functional outcome.   REHAB POTENTIAL: Good  CLINICAL DECISION MAKING: Stable/uncomplicated  EVALUATION COMPLEXITY: Low   GOALS:   SHORT TERM GOALS: Target date:  05/09/2022  Pt will be independent and compliant with HEP for improved ROM, pain, strength, and function.  Baseline: Goal status: MET 05/07/22  2.  Pt will demo at least a 5-8 deg improvement in R ankle Inv and DF AROM for improved mobility, gait, and stiffness.   Baseline:  Goal status: GOAL MET  3.  Pt will report at least a 25% improvement in pain with ambulation.  Baseline:  Goal status: GOAL MET  4.  Pt will demo improved gait with improved Wb'ing  thru and increased stance time on R LE.  Baseline:  Goal status: GOAL MET   LONG TERM GOALS: Target date: 05/30/2022  Pt will be able to ambulate her normal distance without significant pain and difficulty.  Baseline:  Goal status: GOAL MET  2.  Pt will be able to walk her dog including on her drive way without significant pain and difficulty.  Baseline:  Goal status: DISCONTINUED due to her dog passing away.   3.  Pt will demo improved R inversion ankle strength and Post tib to 5/5 MMT for improved mobility with reduced pain and increased ease.  Baseline:  Goal status: GOAL MET  4.  Pt will be able to perform stairs without significant ankle/foot pain.  Baseline:  Goal status: GOAL MET  5.  Pt will be independent with advanced HEP in order to establish long term HEP for strength, function, and mobility.   Goal status:  INITIAL  Target date:   06/27/2022     PLAN:  PT FREQUENCY: 1 visit  PT DURATION: 2 weeks  PLANNED INTERVENTIONS: Therapeutic exercises, Therapeutic activity, Neuromuscular re-education, Balance training, Gait training, Patient/Family education, Self Care, Joint mobilization, Stair training, Aquatic Therapy, Dry Needling, Electrical stimulation, Cryotherapy, Moist heat, Taping, Ultrasound, Manual therapy, and Re-evaluation  PLAN FOR NEXT SESSION:  Pt to be seen for 1 more visit to ensure independence with advanced HEP.    Selinda Michaels III PT, DPT 06/19/22 12:01 PM   '

## 2022-06-22 ENCOUNTER — Encounter (HOSPITAL_BASED_OUTPATIENT_CLINIC_OR_DEPARTMENT_OTHER): Payer: Medicare Other | Admitting: Physical Therapy

## 2022-06-30 LAB — COLOGUARD: Cologuard: NEGATIVE

## 2022-07-13 ENCOUNTER — Encounter: Payer: Self-pay | Admitting: Nurse Practitioner

## 2022-07-13 ENCOUNTER — Ambulatory Visit (INDEPENDENT_AMBULATORY_CARE_PROVIDER_SITE_OTHER): Payer: Medicare Other | Admitting: Nurse Practitioner

## 2022-07-13 ENCOUNTER — Ambulatory Visit (HOSPITAL_BASED_OUTPATIENT_CLINIC_OR_DEPARTMENT_OTHER): Payer: Medicare Other | Admitting: Nurse Practitioner

## 2022-07-13 VITALS — BP 120/74 | HR 74 | Ht 71.0 in | Wt 170.6 lb

## 2022-07-13 DIAGNOSIS — F331 Major depressive disorder, recurrent, moderate: Secondary | ICD-10-CM | POA: Diagnosis not present

## 2022-07-13 DIAGNOSIS — Z0000 Encounter for general adult medical examination without abnormal findings: Secondary | ICD-10-CM | POA: Diagnosis not present

## 2022-07-13 DIAGNOSIS — R635 Abnormal weight gain: Secondary | ICD-10-CM

## 2022-07-13 DIAGNOSIS — E782 Mixed hyperlipidemia: Secondary | ICD-10-CM

## 2022-07-13 DIAGNOSIS — R7303 Prediabetes: Secondary | ICD-10-CM

## 2022-07-13 DIAGNOSIS — E559 Vitamin D deficiency, unspecified: Secondary | ICD-10-CM

## 2022-07-13 NOTE — Progress Notes (Unsigned)
Primary Care & Sports Medicine [facility] at [facility] [address]  [address] [place], [place]  [address] [phone]   MEDICARE [place] M[place] VISIT  07/18/2022  Subjective:  Andrea Paul is a 67 y.o. female patient of Roxan Yamamoto, Sung Amabile, NP who had a Medicare Annual Wellness Visit today. [name] is Retired and lives with their family. she has no children. she reports that she is socially active and does interact with friends/family regularly. she is minimally physically active.  Patient Care Team: Usiel Astarita, Sung Amabile, NP as PCP - General (Nurse Practitioner)     07/13/2022    8:41 AM 04/18/2022    9:55 AM 08/01/2021   10:38 AM 07/21/2021    5:39 PM 02/20/2021    9:38 AM 12/16/2015    1:49 PM 08/18/2013    8:26 PM  Advanced Directives  Does Patient Have a Medical Advance Directive? Yes Yes Yes Yes Yes No Patient does not have advance directive;Patient would like information  Type of Advance Directive Healthcare Power of [employer] Power of [employer] Power of [place];Living will Healthcare Power of [place];Living will     Does patient want to make changes to medical advance directive? Yes (ED - Information included in AVS)   No - Patient declined     Copy of Healthcare Power of Attorney in Chart? No - copy requested  No - copy requested No - copy requested     Would patient like information on creating a medical advance directive?      Yes - Educational materials given     Hospital Utilization Over the Past 12 Months: # of hospitalizations or ER visits: 0 # of surgeries: 0  Review of Systems    Patient reports that her overall health is better when compared to last year.  Review of Systems: Negative except sadness related to loss of her pet and friendship difficulties.   All other systems negative.  Pain Assessment Pain : No/denies painNone     Current Medications & Allergies (verified) Allergies as of 07/13/2022        Reactions   Latex Itching        Medication List        Accurate as of July 13, 2022 11:59 PM. If you have any questions, ask your nurse or doctor.          Acetyl L-Carnitine 500 MG Caps Take 1 capsule by mouth daily.   ARIPiprazole 20 MG tablet Commonly known as: ABILIFY Take 1 tablet by mouth daily.   B-100 BALANCED TR PO Take 1 tablet by mouth daily.   buPROPion 300 MG 24 hr tablet Commonly known as: WELLBUTRIN XL Take by mouth.   buPROPion 150 MG 24 hr tablet Commonly known as: WELLBUTRIN XL Take by mouth.   clonazePAM 0.5 MG tablet Commonly known as: KLONOPIN Take by mouth.   COQ10 PO Take 1 capsule by mouth daily.   cyclobenzaprine 5 MG tablet Commonly known as: FLEXERIL Take 1 tablet (5 mg total) by mouth 3 (three) times daily as needed for muscle spasms.   dicyclomine 10 MG capsule Commonly known as: BENTYL Take 1 capsule (10 mg total) by mouth 3 (three) times daily before meals.   escitalopram 20 MG tablet Commonly known as: LEXAPRO Take 20 mg by mouth daily.   FISH OIL PO Take 1 capsule by mouth daily.   Green Tea Extract 150 MG Caps See admin instructions.   lamoTRIgine 150 MG tablet Commonly known  as: LAMICTAL Take 2 tablets (300 mg total) by mouth daily.   montelukast 10 MG tablet Commonly known as: SINGULAIR TAKE ONE TABLET BY MOUTH EVERY NIGHT AT BEDTIME   multivitamin with minerals Tabs tablet Take 1 tablet by mouth daily.   pantoprazole 40 MG tablet Commonly known as: PROTONIX Take 1 tablet (40 mg total) by mouth 2 (two) times daily. For thirty days   PROBIOTIC DAILY PO Take 1 tablet by mouth daily.   Resveratrol 100 MG Caps 1 capsule   simvastatin 20 MG tablet Commonly known as: ZOCOR Take 1 tablet (20 mg total) by mouth every evening.   VITAMIN C (CALCIUM ASCORBATE) PO Take 1 tablet by mouth daily.   VITAMIN D (CHOLECALCIFEROL) PO Take 1 tablet by mouth daily.        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    Encounter for medical examination to establish care 02/13/2021   Flatulence, eructation and gas pain 07/11/2021   Heart murmur    History of blood transfusion    age 77 years old   History of periodontal disease    Pain in gums 01/16/2013   Post-nasal drip 02/13/2021   Pre-diabetes    Sleep apnea    no CPAP prescribed, sleeps on sides   Past Surgical History:  Procedure Laterality Date   ANTERIOR CERVICAL DECOMP/DISCECTOMY FUSION  09/17/2011   Procedure: ANTERIOR CERVICAL DECOMPRESSION/DISCECTOMY FUSION 2 LEVELS;  Surgeon: Hewitt Shorts, MD;  Location: [facility];  Service: Neurosurgery;  Laterality: Bilateral;  Cervical five-six, six-seven anterior cervical decompression with fusion plating and bonegraft   BASAL CELL CARCINOMA EXCISION  2010   COLONOSCOPY  2014   DILATATION & CURETTAGE/HYSTEROSCOPY WITH MYOSURE N/A 12/19/2015   Procedure: DILATATION & CURETTAGE/HYSTEROSCOPY WITH MYOSURE;  Surgeon: Jerene Bears, MD;  Location: [facility];  Service: Gynecology;  Laterality: N/A;   GANGLION CYST EXCISION     right wrist   TONSILLECTOMY     Family History  Problem Relation Age of Onset   Dementia Mother 59   Rheum arthritis Mother    Alcohol abuse Father    Anxiety disorder Father    Depression Father    Dementia Father 11   Paranoid behavior Father    CAD Father    Depression Sister    Alcohol abuse Brother    Bipolar disorder Brother    Drug abuse Brother    Sexual abuse Paternal Grandfather    Depression Brother    Depression Sister    OCD Neg Hx    Schizophrenia Neg Hx    Thyroid disease Neg Hx    Social History   Socioeconomic History   Marital status: Married     Spouse name: Not on file   Number of children: Not on file   Years of education: 18   Highest education level: Not on file  Occupational History   Not on file  Tobacco Use   Smoking status: Never   Smokeless tobacco: Never  Vaping Use   Vaping Use: Never used  Substance and Sexual Activity   Alcohol use: No  Drug use: No    Comment: Caffeine: 2 cups coffee   Sexual activity: Yes    Partners: Male    Birth control/protection: Post-menopausal  Other Topics Concern   Not on file  Social History Narrative   Not on file   Social Determinants of Health   Financial Resource Strain: Low Risk  (07/18/2022)   Overall Financial Resource Strain (CARDIA)    Difficulty of Paying Living Expenses: Not hard at all  Food Insecurity: No Food Insecurity (07/18/2022)   Hunger Vital Sign    Worried About Running Out of Food in the Last Year: Never true    Ran Out of Food in the Last Year: Never true  Transportation Needs: No Transportation Needs (07/18/2022)   PRAPARE - Administrator, Civil Service (Medical): No    Lack of Transportation (Non-Medical): No  Physical Activity: Insufficiently Active (07/18/2022)   Exercise Vital Sign    Days of Exercise per Week: 3 days    Minutes of Exercise per Session: 20 min  Stress: Stress Concern Present (07/21/2021)   [employer] of Occupational Health - Occupational Stress Questionnaire    Feeling of Stress : To some extent  Social Connections: Moderately Integrated (07/21/2021)   Social Connection and Isolation Panel [NHANES]    Frequency of Communication with Friends and Family: More than three times a week    Frequency of Social Gatherings with Friends and Family: More than three times a week    Attends Religious Services: Never    Database administrator or Organizations: Yes    Attends Engineer, structural: More than 4 times per year    Marital Status: Married    Activities of Daily Living    07/13/2022    8:43 AM  In your  present state of health, do you have any difficulty performing the following activities:  Hearing? 0  Vision? 0  Difficulty concentrating or making decisions? 1  Comment a little bit with memory  Walking or climbing stairs? 0  Dressing or bathing? 0  Doing errands, shopping? 0  Preparing Food and eating ? N  Using the Toilet? N  In the past six months, have you accidently leaked urine? N  Do you have problems with loss of bowel control? N  Managing your Medications? N  Managing your Finances? N  Housekeeping or managing your Housekeeping? N    Patient Education/Literacy How often do you need to have someone help you when you read instructions, pamphlets, or other written materials from your doctor or pharmacy?: 1 - Never  Exercise Current Exercise Habits: Home exercise routine, Type of exercise: strength training/weights;walking, Time (Minutes): 60, Frequency (Times/Week): 2, Weekly Exercise (Minutes/Week): 120, Intensity: Mild, Exercise limited by: None identified  Diet Patient reports consuming 3 meals a day and 1 snack(s) a day Patient reports that her primary diet is: Regular Patient reports that she does have regular access to food.   Depression Screen    07/13/2022    8:38 AM 07/11/2021    9:11 AM 02/21/2021    6:05 PM 12/19/2020    3:39 PM  PHQ 2/9 Scores  PHQ - 2 Score 2 2 0 0  PHQ- 9 Score 4 3 0      Fall Risk    07/13/2022    8:38 AM 04/09/2022   11:02 AM 07/11/2021    9:10 AM 02/20/2021    9:38 AM  Fall Risk   Falls in the past year? 0 0  0 0  Number falls in past yr: 0 0 0 0  Injury with Fall? 0 0 0 0  Risk for fall due to : No Fall Risks No Fall Risks No Fall Risks   Follow up Falls evaluation completed Falls evaluation completed Falls evaluation completed      Objective:   BP 120/74   Pulse 74   Ht 5\' 11"  (1.803 m)   Wt 170 lb 9.6 oz (77.4 kg)   LMP 10/18/2010   BMI 23.79 kg/m   Last Weight  Most recent update: 07/13/2022  8:48 AM    Weight  77.4  kg (170 lb 9.6 oz)             Body mass index is 23.79 kg/m.  Hearing/Vision  Crysta did not have difficulty with hearing/understanding during the face-to-face interview Brytney did not have difficulty with her vision during the face-to-face interview Reports that she has had a formal eye exam by an eye care professional within the past year Reports that she has not had a formal hearing evaluation within the past year  Cognitive Function:    07/11/2021    9:12 AM  6CIT Screen  What Year? 0 points  What month? 0 points  What time? 0 points  Count back from 20 0 points  Months in reverse 0 points  Repeat phrase 0 points  Total Score 0 points    Normal Cognitive Function Screening: Yes (Normal:0-7, Significant for Dysfunction: >8)  Immunization & Health Maintenance Record Immunization History  Administered Date(s) Administered   Covid-19, Mrna,Vaccine(Spikevax)28yrs and older 03/06/2022, 07/06/2022   DTaP 04/05/2008   Fluad Quad(high Dose 65+) 12/14/2020   Influenza Split 02/19/2012   Influenza, High Dose Seasonal PF 12/06/2020   Influenza,inj,Quad PF,6+ Mos 12/18/2016, 12/24/2016, 01/14/2018, 12/16/2018, 12/08/2019   Influenza,inj,quad, With Preservative 11/26/2013, 12/02/2014   Influenza-Unspecified 03/30/2022   Moderna SARS-COV2 Booster Vaccination 02/10/2020, 08/26/2020   Moderna Sars-Covid-2 Vaccination 06/12/2019, 07/14/2019, 09/09/2019, 02/10/2020   PNEUMOCOCCAL CONJUGATE-20 07/11/2021   Tdap 04/05/2008, 08/31/2021   Zoster Recombinat (Shingrix) 08/31/2021, 02/16/2022   Zoster, Live 08/28/2013   Zoster, Unspecified 08/31/2021    Health Maintenance  Topic Date Due   INFLUENZA VACCINE  10/18/2022   Medicare Annual Wellness (AWV)  07/13/2023   MAMMOGRAM  04/11/2024   Fecal DNA (Cologuard)  06/29/2025   DTaP/Tdap/Td (4 - Td or Tdap) 09/01/2031   Pneumonia Vaccine 26+ Years old  Completed   DEXA SCAN  Completed   COVID-19 Vaccine  Completed   Zoster  Vaccines- Shingrix  Completed   HPV VACCINES  Aged Out   COLONOSCOPY (Pts 45-61yrs Insurance coverage will need to be confirmed)  Discontinued   Hepatitis C Screening  Discontinued       Assessment  This is a routine wellness examination for [employer].  Health Maintenance: Due or Overdue There are no preventive care reminders to display for this patient.   Haze Rushing does not need a referral for [employer] Assistance: Care Management:   not applicable Social Work:    not applicable Prescription Assistance:  not applicable Nutrition/Diabetes Education:  not applicable   Plan:  Personalized Goals  Goals Addressed             This Visit's Progress    Patient Stated       I would like to lose 12-15 pounds.        Personalized Health Maintenance & Screening Recommendations  Advanced directives: Information provided on handout for patient to  review.   Lung Cancer Screening Recommended: no (Low Dose CT Chest recommended if Age 39-80 years, 30 pack-year currently smoking OR have quit w/in past 15 years) Hepatitis C Screening recommended: no HIV Screening recommended: no  Advanced Directives: Written information was given per the patient's request.  Referrals & Orders Orders Placed This Encounter  Procedures   Hemoglobin A1c   Comprehensive metabolic panel   CBC with Differential/Platelet   Lipid panel   VITAMIN D 25 Hydroxy (Vit-D Deficiency, Fractures)    Follow-up Plan Follow-up with Darshawn Boateng, Sung Amabile, NP as planned Schedule 1 year    I have personally reviewed and noted the following in the patient's chart:   Medical and social history Use of alcohol, tobacco or illicit drugs  Current medications and supplements Functional ability and status Nutritional status Physical activity Advanced directives List of other physicians Hospitalizations, surgeries, and ER visits in previous 12 months Vitals Screenings to include cognitive,  depression, and falls Referrals and appointments  In addition, I have reviewed and discussed with patient certain preventive protocols, quality metrics, and best practice recommendations. A written personalized care plan for preventive services as well as general preventive health recommendations were provided to patient.     Tollie Eth, DNP, AGNP-c   07/18/2022

## 2022-07-13 NOTE — Patient Instructions (Signed)
WEIGHT LOSS PLANNING Your progress today shows:     07/13/2022    8:46 AM 04/09/2022   11:01 AM 12/07/2021    3:00 PM  Vitals with BMI  Height 5\' 11"   5\' 10"   Weight 170 lbs 10 oz 173 lbs 6 oz 169 lbs 8 oz  BMI 23.8  24.32  Systolic 120 118 161  Diastolic 74 76 66  Pulse 74 80 76    For best management of weight, it is vital to balance intake versus output. This means the number of calories burned per day must be less than the calories you take in with food and drink.   I recommend trying to follow a diet with the following: Calories: 1200-1500 calories per day Carbohydrates: 150-180 grams of carbohydrates per day  Why: Gives your body enough "quick fuel" for cells to maintain normal function without sending them into starvation mode.  Protein: At least 90 grams of protein per day- 30 grams with each meal Why: Protein takes longer and uses more energy than carbohydrates to break down for fuel. The carbohydrates in your meals serves as quick energy sources and proteins help use some of that extra quick energy to break down to produce long term energy. This helps you not feel hungry as quickly and protein breakdown burns calories.  Water: Drink AT LEAST 64 ounces of water per day  Why: Water is essential to healthy metabolism. Water helps to fill the stomach and keep you fuller longer. Water is required for healthy digestion and filtering of waste in the body.  Fat: Limit fats in your diet- when choosing fats, choose foods with lower fats content such as lean meats (chicken, fish, [place]).  Why: Increased fat intake leads to storage "for later". Once you burn your carbohydrate energy, your body goes into fat and protein breakdown mode to help you loose weight.  Cholesterol: Fats and oils that are LIQUID at room temperature are best. Choose vegetable oils (olive oil, avocado oil, nuts). Avoid fats that are SOLID at room temperature (animal fats, processed meats). Healthy fats are often found in  whole grains, beans, nuts, seeds, and berries.  Why: Elevated cholesterol levels lead to build up of cholesterol on the inside of your blood vessels. This will eventually cause the blood vessels to become hard and can lead to high blood pressure and damage to your organs. When the blood flow is reduced, but the pressure is high from cholesterol buildup, parts of the cholesterol can break off and form clots that can go to the brain or heart leading to a stroke or heart attack.  Fiber: Increase amount of SOLUBLE the fiber in your diet. This helps to fill you up, lowers cholesterol, and helps with digestion. Some foods high in soluble fiber are oats, peas, beans, apples, carrots, barley, and citrus fruits.   Why: Fiber fills you up, helps remove excess cholesterol, and aids in healthy digestion which are all very important in weight management.   I recommend the following as a minimum activity routine: Purposeful walk or other physical activity at least 20 minutes every single day. This means purposefully taking a walk, jog, bike, swim, treadmill, elliptical, dance, etc.  This activity should be ABOVE your normal daily activities, such as walking at work. Goal exercise should be at least 150 minutes a week- work your way up to this.   Heart Rate: Your maximum exercise heart rate should be 220 - Your Age in Years. When exercising, get your  heart rate up, but avoid going over the maximum targeted heart rate.  60-70% of your maximum heart rate is where you tend to burn the most fat. To find this number:  220 - Age In Years= Max HR  Max HR x 0.6 (or 0.7) = Fat Burning HR The Fat Burning HR is your goal heart rate while working out to burn the most fat.  NEVER exercise to the point your feel lightheaded, weak, nauseated, dizzy. If you experience ANY of these symptoms- STOP exercise! Allow yourself to cool down and your heart rate to come down. Then restart slower next time.  If at ANY TIME you feel chest  pain or chest pressure during exercise, STOP IMMEDIATELY and seek medical attention.

## 2022-07-14 LAB — CBC WITH DIFFERENTIAL/PLATELET
Basophils Absolute: 0 10*3/uL (ref 0.0–0.2)
Basos: 1 %
EOS (ABSOLUTE): 0 10*3/uL (ref 0.0–0.4)
Eos: 1 %
Hematocrit: 41.8 % (ref 34.0–46.6)
Hemoglobin: 13.8 g/dL (ref 11.1–15.9)
Immature Grans (Abs): 0 10*3/uL (ref 0.0–0.1)
Immature Granulocytes: 0 %
Lymphocytes Absolute: 1.1 10*3/uL (ref 0.7–3.1)
Lymphs: 28 %
MCH: 30.9 pg (ref 26.6–33.0)
MCHC: 33 g/dL (ref 31.5–35.7)
MCV: 94 fL (ref 79–97)
Monocytes Absolute: 0.4 10*3/uL (ref 0.1–0.9)
Monocytes: 10 %
Neutrophils Absolute: 2.4 10*3/uL (ref 1.4–7.0)
Neutrophils: 60 %
Platelets: 199 10*3/uL (ref 150–450)
RBC: 4.46 x10E6/uL (ref 3.77–5.28)
RDW: 12.2 % (ref 11.7–15.4)
WBC: 4 10*3/uL (ref 3.4–10.8)

## 2022-07-14 LAB — HEMOGLOBIN A1C
Est. average glucose Bld gHb Est-mCnc: 120 mg/dL
Hgb A1c MFr Bld: 5.8 % — ABNORMAL HIGH (ref 4.8–5.6)

## 2022-07-14 LAB — COMPREHENSIVE METABOLIC PANEL
ALT: 16 IU/L (ref 0–32)
AST: 18 IU/L (ref 0–40)
Albumin/Globulin Ratio: 2.3 — ABNORMAL HIGH (ref 1.2–2.2)
Albumin: 4.5 g/dL (ref 3.9–4.9)
Alkaline Phosphatase: 57 IU/L (ref 44–121)
BUN/Creatinine Ratio: 13 (ref 12–28)
BUN: 11 mg/dL (ref 8–27)
Bilirubin Total: 0.2 mg/dL (ref 0.0–1.2)
CO2: 22 mmol/L (ref 20–29)
Calcium: 9.3 mg/dL (ref 8.7–10.3)
Chloride: 103 mmol/L (ref 96–106)
Creatinine, Ser: 0.83 mg/dL (ref 0.57–1.00)
Globulin, Total: 2 g/dL (ref 1.5–4.5)
Glucose: 113 mg/dL — ABNORMAL HIGH (ref 70–99)
Potassium: 5 mmol/L (ref 3.5–5.2)
Sodium: 142 mmol/L (ref 134–144)
Total Protein: 6.5 g/dL (ref 6.0–8.5)
eGFR: 78 mL/min/{1.73_m2} (ref >59)

## 2022-07-14 LAB — LIPID PANEL
Chol/HDL Ratio: 2.4 ratio (ref 0.0–4.4)
Cholesterol, Total: 164 mg/dL (ref 100–199)
HDL: 68 mg/dL (ref >39)
LDL Chol Calc (NIH): 81 mg/dL (ref 0–99)
Triglycerides: 80 mg/dL (ref 0–149)
VLDL Cholesterol Cal: 15 mg/dL (ref 5–40)

## 2022-07-14 LAB — VITAMIN D 25 HYDROXY (VIT D DEFICIENCY, FRACTURES): Vit D, 25-Hydroxy: 56.7 ng/mL (ref 30.0–100.0)

## 2022-07-18 ENCOUNTER — Encounter: Payer: Self-pay | Admitting: Nurse Practitioner

## 2022-07-22 ENCOUNTER — Encounter: Payer: Self-pay | Admitting: Nurse Practitioner

## 2022-07-23 ENCOUNTER — Encounter: Payer: Self-pay | Admitting: Nurse Practitioner

## 2022-08-21 ENCOUNTER — Ambulatory Visit (INDEPENDENT_AMBULATORY_CARE_PROVIDER_SITE_OTHER): Payer: Medicare Other | Admitting: Nurse Practitioner

## 2022-08-21 ENCOUNTER — Encounter: Payer: Self-pay | Admitting: Nurse Practitioner

## 2022-08-21 VITALS — BP 134/80 | HR 80 | Wt 170.8 lb

## 2022-08-21 DIAGNOSIS — G259 Extrapyramidal and movement disorder, unspecified: Secondary | ICD-10-CM

## 2022-08-21 DIAGNOSIS — M170 Bilateral primary osteoarthritis of knee: Secondary | ICD-10-CM | POA: Diagnosis not present

## 2022-08-21 NOTE — Progress Notes (Signed)
Tollie Eth, DNP, AGNP-c [facility] [address] [place], [place] [address] [phone]  Subjective:   Andrea Paul is a 67 y.o. female presents to day for evaluation of: Knees- bilateral The patient reports severe knee pain due to arthritis. The pain is particularly severe when transitioning from sitting to standing, standing to sitting, and when navigating stairs. Physical therapy and exercises provide some relief but do not completely eliminate the pain.  The patient has a history of arthritis confirmed by x-rays and has previously consulted Dr. Steward Drone, who suggested physical therapy and potential injections, which the patient declined due to a fear of needles and the temporary nature of the relief they provide.  The patient also has a history of chronic kidney failure triggered by meloxicam, which was prescribed for posterior tibial tendonitis. After discontinuing meloxicam, the patient's kidney function returned to normal. The patient inquires about alternative medications for arthritis pain management, such as methotrexate and Celebrex, but expresses concerns about potential interactions and side effects given their medical history.  Additionally, the patient mentions experiencing involuntary movements in the last three toes of their right foot at night, described as a "hypnotic twitch," which does not cause pain or disrupt sleep. The patient's psychiatrist has advised against the use of NSAIDs like Advil but has approved the use of Tylenol. The patient has not tried Voltaren gel but is interested in exploring it as a potential treatment option for localized pain relief.    PMH, Medications, and Allergies reviewed and updated in chart as appropriate.   ROS negative except for what is listed in HPI. Objective:  BP 134/80   Pulse 80   Wt 170 lb 12.8 oz (77.5 kg)   LMP 10/18/2010   BMI 23.82 kg/m  Physical Exam Vitals and nursing note reviewed.   Constitutional:      Appearance: Normal appearance.  HENT:     Head: Normocephalic.  Eyes:     Pupils: Pupils are equal, round, and reactive to light.  Cardiovascular:     Rate and Rhythm: Normal rate and regular rhythm.     Pulses: Normal pulses.     Heart sounds: Normal heart sounds.  Pulmonary:     Effort: Pulmonary effort is normal.     Breath sounds: Normal breath sounds.  Musculoskeletal:        General: Tenderness present.     Cervical back: Normal range of motion.     Comments: Bilateral knee tenderness present.   Skin:    General: Skin is warm.  Neurological:     General: No focal deficit present.     Mental Status: She is alert and oriented to person, place, and time.  Psychiatric:        Mood and Affect: Mood normal.           Assessment & Plan:   Problem List Items Addressed This Visit     Arthritis of both knees - Primary    The patient has severe knee arthritis confirmed by previous x-rays. The pain is worse with transitions from sitting to standing and using stairs. The patient has a history of chronic kidney disease from previous NSAID use and is cautious about systemic pain medications. The patient prefers to avoid injections due to needle phobia and concerns about temporary relief.  Plan:   1. Diclofenac Gel (Voltaren): Initiate topical diclofenac gel for knee pain management. This minimizes systemic absorption given renal history. Apply gel consistently for 2-3 weeks to assess effectiveness.  2. Knee Sleeves: Recommend knee sleeves for additional support and stability with activities exacerbating pain. 3. Celecoxib: Consider celecoxib if diclofenac gel provides insufficient relief. Close renal monitoring required if initiated. 4. Physical Therapy: Continue beneficial PT exercises for symptom management.   5. Surgical Consultation: Discuss potential for minimally invasive surgery if conservative measures fail, though not first-line.      Movement  disorder    The patient reports involuntary movements of the last three toes at night, without pain or sleep disruption. This appears to be a benign neurological issue, possibly a hypnotic twitch or restless leg syndrome.  Plan: 1. Observation: Monitor for changes in frequency, intensity, or onset of pain. 2. Gabapentin: Consider gabapentin if movements become bothersome or disrupt sleep to manage nerve symptoms.   3. Electromyography: If symptoms progress or significantly impact quality of life, consider EMG study.         Tollie Eth, DNP, AGNP-c 09/16/2022  11:26 PM    History, Medications, Surgery, SDOH, and Family History reviewed and updated as appropriate.

## 2022-08-21 NOTE — Patient Instructions (Addendum)
Knee Pain Diclofenac Gel 1% for your knee pain may be very helpful. This can be purchased over the counter at any pharmacy. It may take a week or so for full effect. I would give it at least 2 weeks and then let me know.

## 2022-08-22 ENCOUNTER — Other Ambulatory Visit: Payer: Self-pay | Admitting: Nurse Practitioner

## 2022-08-22 ENCOUNTER — Encounter: Payer: Self-pay | Admitting: Nurse Practitioner

## 2022-08-22 DIAGNOSIS — E782 Mixed hyperlipidemia: Secondary | ICD-10-CM

## 2022-08-23 MED ORDER — DICLOFENAC SODIUM 1 % EX GEL: 4.0000 g | Freq: Four times a day (QID) | CUTANEOUS | 99 refills | Status: AC

## 2022-09-16 DIAGNOSIS — G259 Extrapyramidal and movement disorder, unspecified: Secondary | ICD-10-CM | POA: Insufficient documentation

## 2022-09-16 DIAGNOSIS — M170 Bilateral primary osteoarthritis of knee: Secondary | ICD-10-CM | POA: Insufficient documentation

## 2022-09-16 NOTE — Assessment & Plan Note (Signed)
The patient reports involuntary movements of the last three toes at night, without pain or sleep disruption. This appears to be a benign neurological issue, possibly a hypnotic twitch or restless leg syndrome.  Plan: 1. Observation: Monitor for changes in frequency, intensity, or onset of pain. 2. Gabapentin: Consider gabapentin if movements become bothersome or disrupt sleep to manage nerve symptoms.   3. Electromyography: If symptoms progress or significantly impact quality of life, consider EMG study.

## 2022-09-16 NOTE — Assessment & Plan Note (Signed)
The patient has severe knee arthritis confirmed by previous x-rays. The pain is worse with transitions from sitting to standing and using stairs. The patient has a history of chronic kidney disease from previous NSAID use and is cautious about systemic pain medications. The patient prefers to avoid injections due to needle phobia and concerns about temporary relief.  Plan:   1. Diclofenac Gel (Voltaren): Initiate topical diclofenac gel for knee pain management. This minimizes systemic absorption given renal history. Apply gel consistently for 2-3 weeks to assess effectiveness. 2. Knee Sleeves: Recommend knee sleeves for additional support and stability with activities exacerbating pain. 3. Celecoxib: Consider celecoxib if diclofenac gel provides insufficient relief. Close renal monitoring required if initiated. 4. Physical Therapy: Continue beneficial PT exercises for symptom management.   5. Surgical Consultation: Discuss potential for minimally invasive surgery if conservative measures fail, though not first-line.

## 2022-09-21 ENCOUNTER — Encounter: Payer: Self-pay | Admitting: Nurse Practitioner

## 2022-09-21 ENCOUNTER — Ambulatory Visit (INDEPENDENT_AMBULATORY_CARE_PROVIDER_SITE_OTHER): Payer: Medicare Other | Admitting: Nurse Practitioner

## 2022-09-21 VITALS — BP 122/80 | HR 84 | Ht 70.0 in | Wt 172.0 lb

## 2022-09-21 DIAGNOSIS — H9193 Unspecified hearing loss, bilateral: Secondary | ICD-10-CM | POA: Diagnosis not present

## 2022-09-21 DIAGNOSIS — L602 Onychogryphosis: Secondary | ICD-10-CM | POA: Diagnosis not present

## 2022-09-21 NOTE — Progress Notes (Signed)
  Tollie Eth, DNP, AGNP-c [facility] [address] [place], [place] [address] [phone]   ACUTE VISIT- ESTABLISHED PATIENT  Blood pressure 122/80, pulse 84, height 5\' 10"  (1.778 m), weight 172 lb (78 kg), last menstrual period 10/18/2010, SpO2 96%.  Subjective:  HPI Andrea Paul is a 67 y.o. female presents to day for evaluation of: Hearing difficulty and concern with toe nail.  Andrea Paul reports difficulty with hearing bilaterally. In the past this was thought to be due to cerumen impaction, however, there was no significant change following ear lavage. She reports the symptoms appear to be the same. She is interested in audiology evaluation.  Andrea Paul reports her great right toenail is painful. She tells me that she has noticed the toenail rubbing against the inside of her shoes and feels this is increasing the pain. She is not aware of any injuries to the toe. She feels her shoes are well fitting and denies catching of the toenail at the top of the shoe repeatedly.    PMH, Medications, and Allergies reviewed and updated in chart as appropriate.   ROS negative except for what is listed in HPI. Objective:  Physical Exam Vitals and nursing note reviewed.  Constitutional:      Appearance: Normal appearance.  HENT:     Right Ear: Tympanic membrane, ear canal and external ear normal. There is no impacted cerumen.     Left Ear: Ear canal and external ear normal. There is no impacted cerumen.  Cardiovascular:     Rate and Rhythm: Normal rate and regular rhythm.     Pulses: Normal pulses.     Heart sounds: Normal heart sounds.  Pulmonary:     Effort: Pulmonary effort is normal.     Breath sounds: Normal breath sounds.  Musculoskeletal:       Feet:  Skin:    General: Skin is warm and dry.     Capillary Refill: Capillary refill takes less than 2 seconds.  Neurological:     Mental Status: She is alert and oriented to person, place, and time.          Assessment & Plan:   Problem List Items Addressed This Visit     Bilateral hearing loss - Primary    Bilateral hearing loss reported with no improvement with clearing of impacted cerumen. Given the noted changes, I recommend hearing evaluation and assessment with audiology for best management. Referral placed.       Relevant Orders   Ambulatory referral to Audiology   Pain around toenail, right foot    Great toenail on right foot thickened and yellowed with pain noted. Recommend podiatry evaluation for recommendations. Unclear if this is fungal infection or lifting of the toenail due to trauma.  Referral placed.          Time: 35 minutes, >50% spent counseling, care coordination, chart review, and documentation.    Tollie Eth, DNP, AGNP-c   History, Medications, Surgery, SDOH, and Family History reviewed and updated as appropriate.

## 2022-10-15 ENCOUNTER — Ambulatory Visit (INDEPENDENT_AMBULATORY_CARE_PROVIDER_SITE_OTHER): Payer: Medicare Other | Admitting: Podiatry

## 2022-10-15 ENCOUNTER — Telehealth: Payer: Self-pay | Admitting: Podiatry

## 2022-10-15 DIAGNOSIS — L603 Nail dystrophy: Secondary | ICD-10-CM

## 2022-10-15 NOTE — Progress Notes (Signed)
Subjective:   Patient ID: Andrea Paul, female   DOB: 67 y.o.   MRN: [id number]   HPI Chief Complaint  Patient presents with   Nail Problem    Hallux nail thick and discolored     67 year old female presents with above concerns.  Several years ago she was hiking and had trauma to the nail and since then it has been damaged. It has been removed and came back the same. It rubs in the shoe. The right 2nd toenail is curing around the tip.   Review of Systems  All other systems reviewed and are negative.  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    Encounter for medical examination to establish care 02/13/2021   Encounter for Medicare annual wellness exam 02/13/2021   Flatulence, eructation and gas pain 07/11/2021   Heart murmur    History of blood transfusion    age 27 years old   History of periodontal disease    Pain in gums 01/16/2013   Post-nasal drip 02/13/2021   Pre-diabetes    Sleep apnea    no CPAP prescribed, sleeps on sides    Past Surgical History:  Procedure Laterality Date   ANTERIOR CERVICAL DECOMP/DISCECTOMY FUSION  09/17/2011   Procedure: ANTERIOR CERVICAL DECOMPRESSION/DISCECTOMY FUSION 2 LEVELS;  Surgeon: Hewitt Shorts, MD;  Location: [facility];  Service: Neurosurgery;  Laterality: Bilateral;  Cervical five-six, six-seven anterior cervical decompression with fusion plating and bonegraft   BASAL CELL CARCINOMA EXCISION  2010   COLONOSCOPY  2014   DILATATION & CURETTAGE/HYSTEROSCOPY WITH MYOSURE N/A 12/19/2015   Procedure: DILATATION & CURETTAGE/HYSTEROSCOPY WITH MYOSURE;  Surgeon: Jerene Bears, MD;  Location: [facility];  Service: Gynecology;  Laterality: N/A;   GANGLION CYST EXCISION      right wrist   TONSILLECTOMY       Current Outpatient Medications:    Acetylcarnitine HCl (ACETYL L-CARNITINE) 500 MG CAPS, Take 1 capsule by mouth daily., Disp: , Rfl:    ARIPiprazole (ABILIFY) 20 MG tablet, Take 1 tablet by mouth daily., Disp: , Rfl:    buPROPion (WELLBUTRIN XL) 150 MG 24 hr tablet, Take by mouth., Disp: , Rfl:    buPROPion (WELLBUTRIN XL) 300 MG 24 hr tablet, Take by mouth., Disp: , Rfl:    clonazePAM (KLONOPIN) 0.5 MG tablet, Take by mouth., Disp: , Rfl:    Coenzyme Q10 (COQ10 PO), Take 1 capsule by mouth daily., Disp: , Rfl:    cyclobenzaprine (FLEXERIL) 5 MG tablet, Take 1 tablet (5 mg total) by mouth 3 (three) times daily as needed for muscle spasms. (Patient not taking: Reported on 07/13/2022), Disp: 30 tablet, Rfl: 2   desvenlafaxine (PRISTIQ) 50 MG 24 hr tablet, Take 50 mg by mouth daily., Disp: , Rfl:    diclofenac Sodium (VOLTAREN) 1 % GEL, Apply 4 g topically 4 (four) times daily. (Patient not taking: Reported on 09/21/2022), Disp: 50 g, Rfl: PRN   Green Tea, Camellia sinensis, (GREEN TEA EXTRACT) 150 MG CAPS, See admin instructions., Disp: , Rfl:    lamoTRIgine (LAMICTAL) 150 MG tablet, Take 2 tablets (300 mg total) by mouth  daily., Disp: 60 tablet, Rfl: 0   Multiple Vitamin (MULTIVITAMIN WITH MINERALS) TABS, Take 1 tablet by mouth daily., Disp: , Rfl:    Omega-3 Fatty Acids (FISH OIL PO), Take 1 capsule by mouth daily. , Disp: , Rfl:    Probiotic Product (PROBIOTIC DAILY PO), Take 1 tablet by mouth daily., Disp: , Rfl:    Resveratrol 100 MG CAPS, 1 capsule, Disp: , Rfl:    simvastatin (ZOCOR) 20 MG tablet, TAKE 1 TABLET BY MOUTH EVERY DAY IN THE EVENING, Disp: 90 tablet, Rfl: 3   VITAMIN C, CALCIUM ASCORBATE, PO, Take 1 tablet by mouth daily., Disp: , Rfl:    VITAMIN D, CHOLECALCIFEROL, PO, Take 1 tablet by mouth daily. , Disp: , Rfl:   Allergies  Allergen Reactions   Latex Itching          Objective:  Physical Exam  General: AAO x3,  NAD  Dermatological: Right hallux and second digit nails hypertrophic, dystrophic with yellow discoloration.  There is no edema, erythema or signs of infection.  No open lesions.  Vascular: Dorsalis Pedis artery and Posterior Tibial artery pedal pulses are 2/4 bilateral with immedate capillary fill time. There is no pain with calf compression, swelling, warmth, erythema.   Neruologic: Grossly intact via light touch bilateral.  Musculoskeletal: No gross boney pedal deformities bilateral. No pain, crepitus, or limitation noted with foot and ankle range of motion bilateral. Muscular strength 5/5 in all groups tested bilateral.      Assessment:   Onychodystrophy     Plan:  -Treatment options discussed including all alternatives, risks, and complications -Etiology of symptoms were discussed -We discussed nail removal versus conservative treatment.  Will start conservative treatment and we both agree with this.  She will be debrided the nail with any complications or bleeding.  Discussed urea nail gel as well as a biotin supplement  Andrea Paul DPM

## 2022-10-15 NOTE — Telephone Encounter (Signed)
On Avs Pt stated it says she should take biotin or vitamins for hair skin and nails. Pt would like to know Which dose should she take for Biotin and which pill should she take?   Please Advise

## 2022-10-15 NOTE — Patient Instructions (Signed)
You can use UREA NAIL GEL on the thick toenails I would also start a BIOTIN supplement or a vitamin for hair, skin and nails

## 2022-10-16 ENCOUNTER — Telehealth: Payer: Self-pay | Admitting: Podiatry

## 2022-10-16 NOTE — Telephone Encounter (Signed)
On Avs Pt stated it says she should take biotin or vitamins for hair skin and nails. Pt would like to know Which dose should she take for Biotin and which pill should she take?    Please Advise pt called again today

## 2022-10-30 DIAGNOSIS — M79674 Pain in right toe(s): Secondary | ICD-10-CM | POA: Insufficient documentation

## 2022-10-30 HISTORY — DX: Pain in right toe(s): M79.674

## 2022-10-30 NOTE — Assessment & Plan Note (Signed)
Great toenail on right foot thickened and yellowed with pain noted. Recommend podiatry evaluation for recommendations. Unclear if this is fungal infection or lifting of the toenail due to trauma.  Referral placed.

## 2022-10-30 NOTE — Assessment & Plan Note (Signed)
Bilateral hearing loss reported with no improvement with clearing of impacted cerumen. Given the noted changes, I recommend hearing evaluation and assessment with audiology for best management. Referral placed.

## 2022-10-31 ENCOUNTER — Encounter: Payer: Self-pay | Admitting: Nurse Practitioner

## 2022-10-31 ENCOUNTER — Ambulatory Visit (INDEPENDENT_AMBULATORY_CARE_PROVIDER_SITE_OTHER): Payer: Medicare Other | Admitting: Nurse Practitioner

## 2022-10-31 VITALS — BP 130/82 | HR 85 | Wt 174.8 lb

## 2022-10-31 DIAGNOSIS — H01136 Eczematous dermatitis of left eye, unspecified eyelid: Secondary | ICD-10-CM | POA: Insufficient documentation

## 2022-10-31 HISTORY — DX: Eczematous dermatitis of left eye, unspecified eyelid: H01.136

## 2022-10-31 MED ORDER — FLUTICASONE PROPIONATE 0.05 % EX CREA
TOPICAL_CREAM | Freq: Every day | CUTANEOUS | Status: AC
Start: 2022-10-31 — End: ?

## 2022-10-31 NOTE — Progress Notes (Signed)
  Tollie Eth, DNP, AGNP-c [facility] [address] [place], [place] [address] [phone]   ACUTE VISIT- ESTABLISHED PATIENT  Blood pressure 130/82, pulse 85, weight 174 lb 12.8 oz (79.3 kg), last menstrual period 10/18/2010.  Subjective:  HPI Andrea Paul is a 67 y.o. female presents to day for evaluation of acute concern(s).   Left Eyelid Irritation Kerrie presents with erythema, inflammation, itching, and stinging on the left eyelid that has been present for about 2 weeks. She has not changed any of her facial products/make-up, shampoo, lotion, or medications. She has not had any exposures to allergens that she is aware of. She does report a history of similar occurring on the side of her nose, for which her dermatologist prescribed fluticasone 0.05% cream for use.  She has only been using her regular facial moisturizer on the area.    ROS negative except for what is listed in HPI. History, Medications, Surgery, SDOH, and Family History reviewed and updated as appropriate.  Objective:  Physical Exam Vitals and nursing note reviewed.  Constitutional:      General: She is not in acute distress.    Appearance: Normal appearance. She is not ill-appearing.  HENT:     Head: Normocephalic.  Eyes:     Extraocular Movements: Extraocular movements intact.     Conjunctiva/sclera: Conjunctivae normal.     Pupils: Pupils are equal, round, and reactive to light.   Neurological:     Mental Status: She is alert.          Assessment & Plan:   Problem List Items Addressed This Visit     Atopic dermatitis of left eyelid - Primary    Symptoms and presentation are consistent with atopic dermatitis of the eyelid. At this time it is unclear of the trigger, but given that she intermittently has a similar presentation on the side of her nose I suspect there is an unknown exposure that triggers her symptoms. We discussed treatment and the nature of the  condition.  Plan: - Start with fluticasone 0.05% cream once a day in a very thin layer on the eye lid. Use for no more than 5 days then take a 2 day break. You may restart use of the cream after the break if symptoms are still present.  - Keep the area moisturized - Avoid rubbing and scratching the area - If the area appears to become infected, please let me know immediately.  - If the rash does not clear after 2 weeks, please let me know.       Relevant Medications   fluticasone (CUTIVATE) 0.05 % cream     Tollie Eth, DNP, AGNP-c

## 2022-10-31 NOTE — Assessment & Plan Note (Signed)
Symptoms and presentation are consistent with atopic dermatitis of the eyelid. At this time it is unclear of the trigger, but given that she intermittently has a similar presentation on the side of her nose I suspect there is an unknown exposure that triggers her symptoms. We discussed treatment and the nature of the condition.  Plan: - Start with fluticasone 0.05% cream once a day in a very thin layer on the eye lid. Use for no more than 5 days then take a 2 day break. You may restart use of the cream after the break if symptoms are still present.  - Keep the area moisturized - Avoid rubbing and scratching the area - If the area appears to become infected, please let me know immediately.  - If the rash does not clear after 2 weeks, please let me know.

## 2022-10-31 NOTE — Patient Instructions (Addendum)
Apply a thin layer of the fluticasone steroid cream to the eye lid once a day for 5 days. If the symptoms are still present, take a 2 day break then repeat the 5 day process until the rash resolves. If you notice the irritation or redness return, you may repeat.    Atopic Dermatitis Atopic dermatitis is a skin disorder that causes inflammation of the skin. It is marked by a red rash and itchy, dry, scaly skin. It is the most common type of eczema. Eczema is a group of skin conditions that cause the skin to become rough and swollen. This condition is generally worse during the cooler winter months and often improves during the warm summer months. Atopic dermatitis usually starts showing signs in infancy and can last through adulthood. This condition cannot be passed from one person to another (is not contagious). Atopic dermatitis may not always be present, but when it is, it is called a flare-up. What are the causes? The exact cause of this condition is not known. Flare-ups may be triggered by: Coming in contact with something that you are sensitive or allergic to (allergen). Stress. Certain foods. Extremely hot or cold weather. Harsh chemicals and soaps. Dry air. Chlorine. What increases the risk? This condition is more likely to develop in people who have a personal or family history of: Eczema. Allergies. Asthma. Hay fever. What are the signs or symptoms? Symptoms of this condition include: Dry, scaly skin. Red, itchy rash. Itchiness, which can be severe. This may occur before the skin rash. This can make sleeping difficult. Skin thickening and cracking that can occur over time. How is this diagnosed? This condition is diagnosed based on: Your symptoms. Your medical history. A physical exam. How is this treated? There is no cure for this condition, but symptoms can usually be controlled. Treatment focuses on: Controlling the itchiness and scratching. You may be given medicines,  such as antihistamines or steroid creams. Limiting exposure to allergens. Recognizing situations that cause stress and developing a plan to manage stress. If your atopic dermatitis does not get better with medicines, or if it is all over your body (widespread), a treatment using a specific type of light (phototherapy) may be used. Follow these instructions at home: Skin care  Keep your skin well moisturized. Doing this seals in moisture and helps to prevent dryness. Use unscented lotions that have petroleum in them. Avoid lotions that contain alcohol or water. They can dry the skin. Keep baths or showers short (less than 5 minutes) in warm water. Do not use hot water. Use mild, unscented cleansers for bathing. Avoid soap and bubble bath. Apply a moisturizer to your skin right after a bath or shower. Do not apply anything to your skin without checking with your health care provider. General instructions Take or apply over-the-counter and prescription medicines only as told by your health care provider. Dress in clothes made of cotton or cotton blends. Dress lightly because heat increases itchiness. When washing your clothes, rinse your clothes twice so all of the soap is removed. Avoid any triggers that can cause a flare-up. Keep your fingernails cut short. Avoid scratching. Scratching makes the rash and itchiness worse. A break in the skin from scratching could result in a skin infection (impetigo). Do not be around people who have cold sores or fever blisters. If you get the infection, it may cause your atopic dermatitis to worsen. Keep all follow-up visits. This is important. Contact a health care provider if: Your  itchiness interferes with sleep. Your rash gets worse or is not better within one week of starting treatment. You have a fever. You have a rash flare-up after having contact with someone who has cold sores or fever blisters. Get help right away if: You develop pus or soft  yellow scabs in the rash area. Summary Atopic dermatitis causes a red rash and itchy, dry, scaly skin. Treatment focuses on controlling the itchiness and scratching, limiting exposure to things that you are sensitive or allergic to (allergens), recognizing situations that cause stress, and developing a plan to manage stress. Keep your skin well moisturized. Keep baths or showers shorter than 5 minutes and use warm water. Do not use hot water. This information is not intended to replace advice given to you by your health care provider. Make sure you discuss any questions you have with your health care provider. Document Revised: 12/14/2019 Document Reviewed: 12/14/2019 Elsevier Patient Education  2023 [employer].

## 2022-12-25 NOTE — Progress Notes (Unsigned)
67 y.o. G0P0000 Married White or Caucasian female here for breast and pelvic exam.  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  Currently not SA due to vaginal changes with menopause.  Has done PT, used vaginal estrogen cream, has tried dilators.  He is supportive/understanding of this.    Denies vaginal bleeding.  Patient's last menstrual period was 10/18/2010.          Sexually active: No.  H/O STD:  h/o HR HPV  Health Maintenance: PCP:  Enid Skeens.  Last wellness appt was 06/2022.  Did blood work at that appt:  year Vaccines are up to date:  will plan flu and covid up Colonoscopy:  did cologuard 06/30/2022 MMG:  03/20/2020 Negative BMD:  04/07/2021 Last pap smear:  12/19/2020 Negative.   H/o abnormal pap smear:      reports that she has never smoked. She has never used smokeless tobacco. She reports that she does not drink alcohol and does not use drugs.  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    Encounter for medical examination to establish care 02/13/2021   Encounter for Medicare annual wellness exam 02/13/2021   Flatulence, eructation and gas pain 07/11/2021   Heart murmur    History of blood transfusion    age 24 years old   History of periodontal disease    Pain in gums 01/16/2013   Post-nasal drip 02/13/2021   Pre-diabetes    Sleep apnea    no CPAP prescribed, sleeps on sides    Past Surgical History:  Procedure Laterality Date   ANTERIOR CERVICAL DECOMP/DISCECTOMY FUSION  09/17/2011   Procedure: ANTERIOR CERVICAL DECOMPRESSION/DISCECTOMY FUSION 2 LEVELS;  Surgeon: Hewitt Shorts, MD;  Location: [facility];  Service: Neurosurgery;  Laterality: Bilateral;  Cervical five-six, six-seven anterior cervical  decompression with fusion plating and bonegraft   BASAL CELL CARCINOMA EXCISION  2010   COLONOSCOPY  2014   DILATATION & CURETTAGE/HYSTEROSCOPY WITH MYOSURE N/A 12/19/2015   Procedure: DILATATION & CURETTAGE/HYSTEROSCOPY WITH MYOSURE;  Surgeon: Jerene Bears, MD;  Location: [facility];  Service: Gynecology;  Laterality: N/A;   GANGLION CYST EXCISION     right wrist   TONSILLECTOMY      Current Outpatient Medications  Medication Sig Dispense Refill   Acetylcarnitine HCl (ACETYL L-CARNITINE) 500 MG CAPS Take 1 capsule by mouth daily.     ARIPiprazole (ABILIFY) 20 MG tablet Take 1 tablet by mouth daily.     clonazePAM (KLONOPIN) 0.5 MG tablet Take by mouth.     Coenzyme Q10 (COQ10 PO) Take 1 capsule by mouth daily.     desvenlafaxine (PRISTIQ) 50 MG 24 hr tablet Take 50 mg by mouth daily.     diclofenac Sodium (VOLTAREN) 1 % GEL Apply 4 g topically 4 (four) times daily. 50 g PRN   fluticasone (CUTIVATE) 0.05 % cream Apply topically daily. Thin layer to eyelid once a day at bedtime for 5 days. If needed, may repeat after 2 day break. Restart if rash reoccurs.     Green Tea, Camellia sinensis, (GREEN TEA EXTRACT) 150 MG CAPS See admin instructions.  lamoTRIgine (LAMICTAL) 150 MG tablet Take 2 tablets (300 mg total) by mouth daily. 60 tablet 0   Multiple Vitamin (MULTIVITAMIN WITH MINERALS) TABS Take 1 tablet by mouth daily.     Omega-3 Fatty Acids (FISH OIL PO) Take 1 capsule by mouth daily.      Probiotic Product (PROBIOTIC DAILY PO) Take 1 tablet by mouth daily.     Resveratrol 100 MG CAPS 1 capsule     simvastatin (ZOCOR) 20 MG tablet TAKE 1 TABLET BY MOUTH EVERY DAY IN THE EVENING 90 tablet 3   VITAMIN C, CALCIUM ASCORBATE, PO Take 1 tablet by mouth daily.     VITAMIN D, CHOLECALCIFEROL, PO Take 1 tablet by mouth daily.      buPROPion (WELLBUTRIN XL) 150 MG 24 hr tablet Take by mouth.     buPROPion (WELLBUTRIN XL) 300 MG 24 hr tablet Take by mouth.     No current facility-administered  medications for this visit.    Family History  Problem Relation Age of Onset   Dementia Mother 43   Rheum arthritis Mother    Alcohol abuse Father    Anxiety disorder Father    Depression Father    Dementia Father 9   Paranoid behavior Father    CAD Father    Depression Sister    Alcohol abuse Brother    Bipolar disorder Brother    Drug abuse Brother    Sexual abuse Paternal Grandfather    Depression Brother    Depression Sister    OCD Neg Hx    Schizophrenia Neg Hx    Thyroid disease Neg Hx     Review of Systems  Constitutional: Negative.   Genitourinary: Negative.     Exam:   BP 136/70 (BP Location: Right Arm, Patient Position: Sitting, Cuff Size: Normal)   Pulse 88   Ht 5\' 10"  (1.778 m)   Wt 176 lb 12.8 oz (80.2 kg)   LMP 10/18/2010   BMI 25.37 kg/m   Height: 5\' 10"  (177.8 cm)  General appearance: alert, cooperative and appears stated age Breasts: normal appearance, no masses or tenderness Abdomen: soft, non-tender; bowel sounds normal; no masses,  no organomegaly Lymph nodes: Cervical, supraclavicular, and axillary nodes normal.  No abnormal inguinal nodes palpated Neurologic: Grossly normal  Pelvic: External genitalia:  no lesions              Urethra:  normal appearing urethra with no masses, tenderness or lesions              Bartholins and Skenes: normal                 Vagina: normal appearing vagina with atrophic changes and no discharge, no lesions              Cervix: no lesions              Pap taken: Yes.   Bimanual Exam:  Uterus:  normal size, contour, position, consistency, mobility, non-tender              Adnexa: normal adnexa and no mass, fullness, tenderness               Rectovaginal: Confirms               Anus:  normal sphincter tone, no lesions  Chaperone, Hendricks Milo, CMA, was present for exam.  Assessment/Plan: 1. GYN exam for high-risk Medicare patient - Pap smear obtained today - Mammogram 03/2022 - Colonoscopy declined  but  did cologuard earlier this year - Bone mineral density 2023, normal - lab work done with PCP, [facility] Dec Early - vaccines reviewed/updated  2. Cervical cancer screening - Cytology - PAP( [place])  3. High risk HPV infection  4. Hypoestrogenism - on Vit D.  Reviewed.  Does not need extra calcium at this time. - plan to repeat BMD in about 4 years from last one  5. Prediabetes - watching diet

## 2022-12-27 ENCOUNTER — Other Ambulatory Visit (HOSPITAL_COMMUNITY)
Admission: RE | Admit: 2022-12-27 | Discharge: 2022-12-27 | Disposition: A | Discharge status: Home / Self Care | Payer: Medicare Other | Source: Ambulatory Visit | Attending: Obstetrics & Gynecology | Admitting: Obstetrics & Gynecology

## 2022-12-27 ENCOUNTER — Ambulatory Visit (HOSPITAL_BASED_OUTPATIENT_CLINIC_OR_DEPARTMENT_OTHER): Payer: Medicare Other | Admitting: Obstetrics & Gynecology

## 2022-12-27 ENCOUNTER — Encounter (HOSPITAL_BASED_OUTPATIENT_CLINIC_OR_DEPARTMENT_OTHER): Payer: Self-pay | Admitting: Obstetrics & Gynecology

## 2022-12-27 VITALS — BP 136/70 | HR 88 | Ht 70.0 in | Wt 176.8 lb

## 2022-12-27 DIAGNOSIS — B977 Papillomavirus as the cause of diseases classified elsewhere: Secondary | ICD-10-CM

## 2022-12-27 DIAGNOSIS — E2839 Other primary ovarian failure: Secondary | ICD-10-CM

## 2022-12-27 DIAGNOSIS — Z9189 Other specified personal risk factors, not elsewhere classified: Secondary | ICD-10-CM | POA: Diagnosis not present

## 2022-12-27 DIAGNOSIS — R7303 Prediabetes: Secondary | ICD-10-CM

## 2022-12-27 DIAGNOSIS — Z1151 Encounter for screening for human papillomavirus (HPV): Secondary | ICD-10-CM | POA: Diagnosis not present

## 2022-12-27 DIAGNOSIS — Z124 Encounter for screening for malignant neoplasm of cervix: Secondary | ICD-10-CM | POA: Diagnosis not present

## 2022-12-27 DIAGNOSIS — Z01411 Encounter for gynecological examination (general) (routine) with abnormal findings: Secondary | ICD-10-CM | POA: Insufficient documentation

## 2022-12-31 LAB — CYTOLOGY - PAP
Comment: NEGATIVE
Diagnosis: NEGATIVE
High risk HPV: NEGATIVE

## 2023-01-08 ENCOUNTER — Encounter: Payer: Self-pay | Admitting: Nurse Practitioner

## 2023-01-14 ENCOUNTER — Ambulatory Visit (INDEPENDENT_AMBULATORY_CARE_PROVIDER_SITE_OTHER): Payer: Medicare Other | Admitting: Nurse Practitioner

## 2023-01-14 ENCOUNTER — Encounter: Payer: Self-pay | Admitting: Nurse Practitioner

## 2023-01-14 VITALS — BP 124/82 | HR 72 | Wt 170.4 lb

## 2023-01-14 DIAGNOSIS — H66001 Acute suppurative otitis media without spontaneous rupture of ear drum, right ear: Secondary | ICD-10-CM | POA: Diagnosis not present

## 2023-01-14 DIAGNOSIS — J301 Allergic rhinitis due to pollen: Secondary | ICD-10-CM

## 2023-01-14 HISTORY — DX: Acute suppurative otitis media without spontaneous rupture of ear drum, right ear: H66.001

## 2023-01-14 MED ORDER — AMOXICILLIN-POT CLAVULANATE 875-125 MG PO TABS
1.0000 | ORAL_TABLET | Freq: Two times a day (BID) | ORAL | 0 refills | Status: DC
Start: 2023-01-14 — End: 2023-05-27

## 2023-01-14 MED ORDER — LEVOCETIRIZINE DIHYDROCHLORIDE 5 MG PO TABS
5.0000 mg | ORAL_TABLET | Freq: Every evening | ORAL | 3 refills | Status: DC
Start: 2023-01-14 — End: 2023-05-27

## 2023-01-14 MED ORDER — FLUCONAZOLE 150 MG PO TABS: 150.0000 mg | ORAL_TABLET | Freq: Once | ORAL | 0 refills | Status: AC

## 2023-01-14 NOTE — Progress Notes (Signed)
Tollie Eth, DNP, AGNP-c [facility] [address] [place], [place] [address] [phone]   ACUTE VISIT- ESTABLISHED PATIENT  Blood pressure 124/82, pulse 72, weight 170 lb 6.4 oz (77.3 kg), last menstrual period 10/18/2010.  Subjective:  HPI Andrea Paul is a 67 y.o. female presents to day for evaluation of acute concern(s).   History of Present Illness [name] presented with unilateral right sided ear pain and sore throat that has been progressively worsening over the past three to four days. The discomfort, primarily located on the same side as the ear pain, tends to intensify in the evenings. The patient has been managing the pain with a heating pad and Tylenol, which provides temporary relief. The patient also reported an increase in postnasal drip, which she attributes to allergies. A previous trial of an unspecified allergy medication did not yield significant improvement.  The patient also reported a constant need to clear her throat due to the postnasal drip. She has been attempting to cough up the mucus, often with significant effort, and has been successful at times.  In addition to the current symptoms, the patient disclosed an upcoming major lifestyle change. Her mother-in-law, diagnosed with Parkinson's and Alzheimer's, will be moving in with her. The patient expressed some anxiety about this change, acknowledging the challenges of caring for an elderly family member with complex health needs. The patient is committed to providing the best possible care for her mother-in-law and is seeking new healthcare providers for her, including a primary care physician.  ROS negative except for what is listed in HPI. History, Medications, Surgery, SDOH, and Family History reviewed and updated as appropriate.  Objective:  Physical Exam Vitals and nursing note reviewed.  HENT:     Head: Normocephalic.     Right Ear: Tenderness present. A middle ear  effusion is present. Tympanic membrane is bulging.     Left Ear: No tenderness. A middle ear effusion is present.     Nose: Mucosal edema present.     Right Turbinates: Enlarged.     Left Turbinates: Enlarged.     Mouth/Throat:     Pharynx: Postnasal drip present.  Cardiovascular:     Rate and Rhythm: Normal rate and regular rhythm.     Pulses: Normal pulses.     Heart sounds: Normal heart sounds.  Pulmonary:     Effort: Pulmonary effort is normal.     Breath sounds: Normal breath sounds.  Lymphadenopathy:     Cervical: Cervical adenopathy present.  Skin:    General: Skin is warm and dry.     Capillary Refill: Capillary refill takes less than 2 seconds.  Neurological:     General: No focal deficit present.     Mental Status: She is alert and oriented to person, place, and time.  Psychiatric:        Mood and Affect: Mood normal.        Behavior: Behavior normal.          Assessment & Plan:   Problem List Items Addressed This Visit     Allergic rhinitis    Chronic postnasal drip, throat clearing, and non-productive nasal discharge. Previous allergy medication ineffective. -Prescribe Levocetirizine and advise to take at bedtime due to potential drowsiness.      Non-recurrent acute suppurative otitis media of right ear without spontaneous rupture of tympanic membrane - Primary    Ear pain and sore throat on the same side for the past 3-4 days, worsening in the evening.  No eardrum rupture. Likely secondary to postnasal drip and potential bacterial infection. Cervical adenopathy present on the right.  -Prescribe antibiotic and monitor for improvement. If no improvement by 01/17/2023, patient to notify clinic.      Relevant Medications   levocetirizine (XYZAL) 5 MG tablet   amoxicillin-clavulanate (AUGMENTIN) 875-125 MG tablet   fluconazole (DIFLUCAN) 150 MG tablet      Tollie Eth, DNP, AGNP-c

## 2023-01-14 NOTE — Assessment & Plan Note (Signed)
Chronic postnasal drip, throat clearing, and non-productive nasal discharge. Previous allergy medication ineffective. -Prescribe Levocetirizine and advise to take at bedtime due to potential drowsiness.

## 2023-01-14 NOTE — Assessment & Plan Note (Signed)
Ear pain and sore throat on the same side for the past 3-4 days, worsening in the evening. No eardrum rupture. Likely secondary to postnasal drip and potential bacterial infection. Cervical adenopathy present on the right.  -Prescribe antibiotic and monitor for improvement. If no improvement by 01/17/2023, patient to notify clinic.

## 2023-01-15 ENCOUNTER — Ambulatory Visit (INDEPENDENT_AMBULATORY_CARE_PROVIDER_SITE_OTHER): Payer: Medicare Other | Admitting: Podiatry

## 2023-01-15 ENCOUNTER — Encounter: Payer: Self-pay | Admitting: Podiatry

## 2023-01-15 DIAGNOSIS — L603 Nail dystrophy: Secondary | ICD-10-CM

## 2023-01-15 NOTE — Progress Notes (Signed)
Subjective: Chief Complaint  Patient presents with   Routine Post Op    PATIENT STATES THAT SHE HAS BEEN USING THE GEL ON IT DAILY AND SHE HAS NOT NOTICE ANY CHANGE WHILE USING THE GEL FROM LAST VISIT , PATIENT STATES SHE HAS NOT BEEN ANY PAIN AND HAS NOT TAKEN ANY MEDICATION.   67 year old female presents the office with above concerns.  She has been using the urea nail gel but she has not seen any improvement in her toenail.  No swelling redness or drainage.  She said that prior to her last appointment filed down is no longer hitting her toes as not sore.  Objective: AAO x3, NAD DP/PT pulses palpable bilaterally, CRT less than 3 seconds Right hallux toenails hypertrophic, dystrophic with yellow, white discoloration.  There is no edema, erythema manage positive signs of infection.  No open lesions. No pain with calf compression, swelling, warmth, erythema  Assessment: Right hallux onychodystrophy  Plan: -All treatment options discussed with the patient including all alternatives, risks, complications.  -At this time on a rebiopsy.  Sharply debridement culture, pathology.This was sent to [facility].  If still comes back is not fungus consider steroid in combination with the urea.  Discussed duration of use of the takes a long time to help the nails grow out. -For now continue urea cream. -Patient encouraged to call the office with any questions, concerns, change in symptoms.   Vivi Barrack DPM

## 2023-01-24 ENCOUNTER — Other Ambulatory Visit: Payer: Self-pay | Admitting: Podiatry

## 2023-01-24 ENCOUNTER — Encounter: Payer: Self-pay | Admitting: Podiatry

## 2023-01-24 MED ORDER — CLOBETASOL PROPIONATE 0.05 % EX GEL: 1.0000 | Freq: Two times a day (BID) | CUTANEOUS | 1 refills | Status: AC

## 2023-04-17 ENCOUNTER — Encounter: Payer: Self-pay | Admitting: Nurse Practitioner

## 2023-04-17 LAB — HM MAMMOGRAPHY

## 2023-05-14 ENCOUNTER — Ambulatory Visit: Payer: Medicare Other | Admitting: Nurse Practitioner

## 2023-05-14 ENCOUNTER — Encounter: Payer: Self-pay | Admitting: Nurse Practitioner

## 2023-05-14 VITALS — BP 126/82 | HR 83 | Wt 177.4 lb

## 2023-05-14 DIAGNOSIS — H00011 Hordeolum externum right upper eyelid: Secondary | ICD-10-CM | POA: Diagnosis not present

## 2023-05-14 DIAGNOSIS — R0982 Postnasal drip: Secondary | ICD-10-CM

## 2023-05-14 MED ORDER — BACITRACIN-POLYMYXIN B 500-10000 UNIT/GM OP OINT: 1.0000 | TOPICAL_OINTMENT | Freq: Two times a day (BID) | OPHTHALMIC | 0 refills | Status: DC

## 2023-05-14 NOTE — Progress Notes (Unsigned)
 Tollie Eth, DNP, AGNP-c [facility] [address] [place], [place] [address] [phone]   ACUTE VISIT- ESTABLISHED PATIENT  Blood pressure 126/82, pulse 83, weight 177 lb 6.4 oz (80.5 kg), last menstrual period 10/18/2010.  Subjective:  HPI Andrea Paul is a 68 y.o. female presents to day for evaluation of acute concern(s).   History of Present Illness Andrea Paul is a 68 year old female who presents with a sty in the right eye.  She has been experiencing discomfort in her right eye for the past three to four days. The sensation is described as a bump under the eyelid, which is painful and feels scratchy and itchy. This leads her to squeeze her eye shut due to the itchiness. The discomfort is particularly noticeable in the morning when she blinks or closes her eyes, causing a slight ache that disturbs her daily activities.  She has a persistent issue with throat clearing and post-nasal drip, which has been ongoing for years. Various medications, including levocetirizine, have been tried without relief. This condition causes her to clear her throat frequently throughout the day, which she finds embarrassing and self-conscious. No recent upper respiratory symptoms are reported.  She is currently on immunosuppressive medication, which makes her cautious about exposure to infections, such as the flu.  ROS negative except for what is listed in HPI. History, Medications, Surgery, SDOH, and Family History reviewed and updated as appropriate.  Objective:  Physical Exam Vitals and nursing note reviewed.  Constitutional:      General: She is not in acute distress.    Appearance: Normal appearance. She is not ill-appearing.  HENT:     Head: Normocephalic.  Eyes:     General: Gaze aligned appropriately. No visual field deficit or scleral icterus.       Right eye: Hordeolum present. No discharge.     Extraocular Movements: Extraocular movements  intact.     Conjunctiva/sclera: Conjunctivae normal.      Comments: Hordeolum with erythema noted under the left upper eye lid.   Neurological:     Mental Status: She is alert.         Assessment & Plan:   Problem List Items Addressed This Visit     PND (post-nasal drip)   Reports long-standing issue with chronic postnasal drip and frequent throat clearing, unresponsive to levocetirizine and other antihistamines and nasal sprays. Suggested that an underlying ENT issue might be contributing to the symptoms. Recommended referral to an ENT specialist for further evaluation. - Refer to ENT specialist for evaluation of chronic postnasal drip      Relevant Orders   Ambulatory referral to ENT   Hordeolum externum of right upper eyelid - Primary   Presents with a 3-4 day history of a stye in the right eye, located under the eyelid, causing itching, pain, and discomfort. Explained that a stye is an inflamed oil gland that can become infected, leading to redness and swelling. Discussed that while styes can resolve on their own, antibiotic ointment typically helps clear it faster. Informed about the application of antibiotic ointment and the use of warm compresses to facilitate drainage. Discussed potential for temporary blurriness after ointment application and advised to avoid touching the affected area to prevent spreading. - Prescribe antibiotic ointment, apply twice daily for 3-5 days - Advise warm compresses to facilitate drainage - Instruct on proper application of ointment - Advise on hygiene measures, including washing with baby shampoo to prevent spread  Relevant Medications   bacitracin-polymyxin b (POLYSPORIN) ophthalmic ointment      Tollie Eth, DNP, AGNP-c

## 2023-05-14 NOTE — Patient Instructions (Addendum)
 How to Apply Wash hands thoroughly. Apply a small amount to the affected eyelid (inside or along the lash line) 2 times daily. Avoid touching the tip of the tube to the eye. Continue use for several days or as prescribed. Pull the bottom eye lid down and apply in the "pocket" for ease of application.   Other Treatments to Combine ? Warm compresses (4-5 times daily) - Helps drain the stye. ? Lid hygiene - Clean with diluted baby shampoo or eyelid wipes. ?? Avoid squeezing the stye, as this can worsen the infection.   Stye A stye, also known as a hordeolum, is a bump that forms on an eyelid. It may look like a pimple next to the eyelash. A stye can form inside the eyelid (internal stye) or outside the eyelid (external stye). A stye can cause redness, swelling, and pain on the eyelid. Styes are very common. Anyone can get them at any age. They usually occur in just one eye at a time, but you may have more than one in either eye. What are the causes? A stye is caused by an infection. The infection is almost always caused by bacteria called Staphylococcus aureus. This is a common type of bacteria that lives on the skin. An internal stye may result from an infected oil-producing gland inside the eyelid. An external stye may be caused by an infection at the base of the eyelash (hair follicle). What increases the risk? You are more likely to develop a stye if: You have had a stye before. You have any of these conditions: Red, itchy, inflamed eyelids (blepharitis). A skin condition such as seborrheic dermatitis or rosacea. High fat levels in your blood (lipids). Dry eyes. What are the signs or symptoms? The most common symptom of a stye is eyelid pain. Internal styes are more painful than external styes. Other symptoms may include: Painful swelling of your eyelid. A scratchy feeling in your eye. Tearing and redness of your eye. A pimple-like bump on the edge of the eyelid. Pus draining from  the stye. How is this diagnosed? Your health care provider may be able to diagnose a stye just by examining your eye. The health care provider may also check to make sure: You do not have a fever or other signs of a more serious infection. The infection has not spread to other parts of your eye or areas around your eye. How is this treated? Most styes will clear up in a few days without treatment or with warm compresses applied to the area. You may need to use antibiotic drops or ointment to treat an infection. Sometimes, steroid drops or ointment are used in addition to antibiotics. In some cases, your health care provider may give you a small steroid injection in the eyelid. If your stye does not heal with routine treatment, your health care provider may drain pus from the stye using a thin blade or needle. This may be done if the stye is large, causing a lot of pain, or affecting your vision. Follow these instructions at home: Take over-the-counter and prescription medicines only as told by your health care provider. This includes eye drops or ointments. If you were prescribed an antibiotic medicine, steroid medicine, or both, apply or use them as told by your health care provider. Do not stop using the medicine even if your condition improves. Apply a warm, wet cloth (warm compress) to your eye for 5-10 minutes, 4 to 6 times a day. Clean the affected eyelid  as directed by your health care provider. Do not wear contact lenses or eye makeup until your stye has healed and your health care provider says that it is safe. Do not try to pop or drain the stye. Do not rub your eye. Contact a health care provider if: You have chills or a fever. Your stye does not go away after several days. Your stye affects your vision. Your eyeball becomes swollen, red, or painful. Get help right away if: You have pain when moving your eye around. Summary A stye is a bump that forms on an eyelid. It may look  like a pimple next to the eyelash. A stye can form inside the eyelid (internal stye) or outside the eyelid (external stye). A stye can cause redness, swelling, and pain on the eyelid. Your health care provider may be able to diagnose a stye just by examining your eye. Apply a warm, wet cloth (warm compress) to your eye for 5-10 minutes, 4 to 6 times a day. This information is not intended to replace advice given to you by your health care provider. Make sure you discuss any questions you have with your health care provider. Document Revised: 05/11/2020 Document Reviewed: 05/11/2020 Elsevier Patient Education  2024 [employer].

## 2023-05-15 ENCOUNTER — Encounter: Payer: Self-pay | Admitting: Nurse Practitioner

## 2023-05-15 ENCOUNTER — Ambulatory Visit: Payer: Medicare Other | Admitting: Medical

## 2023-05-15 DIAGNOSIS — H00011 Hordeolum externum right upper eyelid: Secondary | ICD-10-CM | POA: Insufficient documentation

## 2023-05-15 NOTE — Assessment & Plan Note (Signed)
 Reports long-standing issue with chronic postnasal drip and frequent throat clearing, unresponsive to levocetirizine and other antihistamines and nasal sprays. Suggested that an underlying ENT issue might be contributing to the symptoms. Recommended referral to an ENT specialist for further evaluation. - Refer to ENT specialist for evaluation of chronic postnasal drip

## 2023-05-15 NOTE — Assessment & Plan Note (Signed)
 Presents with a 3-4 day history of a stye in the right eye, located under the eyelid, causing itching, pain, and discomfort. Explained that a stye is an inflamed oil gland that can become infected, leading to redness and swelling. Discussed that while styes can resolve on their own, antibiotic ointment typically helps clear it faster. Informed about the application of antibiotic ointment and the use of warm compresses to facilitate drainage. Discussed potential for temporary blurriness after ointment application and advised to avoid touching the affected area to prevent spreading. - Prescribe antibiotic ointment, apply twice daily for 3-5 days - Advise warm compresses to facilitate drainage - Instruct on proper application of ointment - Advise on hygiene measures, including washing with baby shampoo to prevent spread

## 2023-05-23 ENCOUNTER — Ambulatory Visit: Payer: Self-pay | Admitting: Nurse Practitioner

## 2023-05-23 NOTE — Telephone Encounter (Signed)
 Copied from CRM [id number]. Topic: Clinical - Medical Advice >> May 23, 2023  9:01 AM Clide Dales wrote: Patient states that she was seen in office on 2/25 for a sty in her right eye. Patient was given Polysporin to use for 3-5 days, however she says that the sty is not gone after 10 days. Please advise.   Chief Complaint: Sty on eye Symptoms: Redness and swelling of right upper eyelid Frequency: Constant  Pertinent Negatives: Patient denies blurred vision  Disposition: [] ED /[] Urgent Care (no appt availability in office) / [] Appointment(In office/virtual)/ []  [place] [facility]/ [x] Home Care/ [] Refused Recommended Disposition /[] [facility] [facility]/ []  Follow-up with PCP Additional Notes: Patient reports that she was seen last week for a sty on her right upper eyelid. She states that she used the antibiotic cream which has improved the sty but has not fully resolved it. I advised that the antibiotic cream can help speed up the healing process but does not necessarily resolve it on it's own, and that they can take some time to heal. Patient understood and states she will call on Monday for a follow up appointment if her sty has not resolved by that time. Patient instructed to call back for new or worsening symptoms. Patient verbalized understanding and agreement with this plan.     Reason for Disposition  Sty on eyelid  Answer Assessment - Initial Assessment Questions 1. LOCATION: "Which eye has the sty?" "Upper or lower eyelid?"     Right eye  2. SIZE: "How big is it?" (Note: standard pencil eraser is 6 mm)     Hard to see it but bump seems smaller  3. EYELID: "Is the eyelid swollen?" If Yes, ask: "How much?"     No 4. REDNESS: "Has the redness spread onto the eyelid?"     Yes 5. ONSET: "When did you notice the sty?"     13-14 days ago  Protocols used: Sty-A-AH

## 2023-05-27 ENCOUNTER — Ambulatory Visit: Admitting: Medical

## 2023-05-27 VITALS — BP 110/70 | HR 89 | Temp 98.0°F | Resp 16 | Wt 171.8 lb

## 2023-05-27 DIAGNOSIS — J329 Chronic sinusitis, unspecified: Secondary | ICD-10-CM

## 2023-05-27 DIAGNOSIS — R6889 Other general symptoms and signs: Secondary | ICD-10-CM

## 2023-05-27 DIAGNOSIS — J988 Other specified respiratory disorders: Secondary | ICD-10-CM | POA: Diagnosis not present

## 2023-05-27 DIAGNOSIS — R051 Acute cough: Secondary | ICD-10-CM

## 2023-05-27 LAB — POC COVID19 BINAXNOW: SARS Coronavirus 2 Ag: NEGATIVE

## 2023-05-27 LAB — POCT INFLUENZA A/B
Influenza A, POC: NEGATIVE
Influenza B, POC: NEGATIVE

## 2023-05-27 MED ORDER — DM-GUAIFENESIN ER 30-600 MG PO TB12: 1.0000 | ORAL_TABLET | Freq: Two times a day (BID) | ORAL | 0 refills | Status: AC

## 2023-05-27 MED ORDER — AZITHROMYCIN 250 MG PO TABS
ORAL_TABLET | ORAL | 0 refills | Status: DC
Start: 2023-05-27 — End: 2023-08-26

## 2023-05-27 NOTE — Patient Instructions (Signed)
 Given your symptoms including purulent blood-tinged mucus and ongoing symptoms, use the following recommendations: Drink at least 80 to 100 ounces of water daily Consider using Mucinex DM twice daily over-the-counter for the next 5 days Begin Z-Pak antibiotic Rest You can continue some Tylenol to help with discomfort and headache If not much improved towards the end of the week then let us know

## 2023-05-27 NOTE — Progress Notes (Signed)
 Subjective:  Andrea Paul is a 68 y.o. female who presents for Chief Complaint  Patient presents with   Cough    Cough, runny nose, ear ache, pressure, sore throat, fatigue, headache, temp 99-100, sinus pressure, chest congestion, symptoms started on Thursday,      Here for illness.  She notes 4+ days of cough, runny nose, ear pressure, sore throat, fatigue, headaches, low grade fever, sinus pressure, chest congestion.   Picked up from husband who has been sick.  Using some tylenol for headache.  Nonsmoker.  No other aggravating or relieving factors.    No other c/o.  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    Encounter for medical examination to establish care 02/13/2021   Encounter for Medicare annual wellness exam 02/13/2021   Flatulence, eructation and gas pain 07/11/2021   Heart murmur    History of blood transfusion    age 74 years old   History of periodontal disease    Non-recurrent acute suppurative otitis media of right ear without spontaneous rupture of tympanic membrane 01/14/2023   Pain around toenail, right foot 10/30/2022   Pain in gums 01/16/2013   Post-nasal drip 02/13/2021   Pre-diabetes    Rectal bleeding 07/11/2021   Sleep apnea    no CPAP prescribed, sleeps on sides    Current Outpatient Medications on File Prior to Visit  Medication Sig Dispense Refill   ARIPiprazole (ABILIFY) 20 MG tablet Take 1 tablet by mouth daily.     bacitracin-polymyxin b (POLYSPORIN) ophthalmic ointment Place 1 Application into the left eye every 12 (twelve) hours. Use for 3-5 days. 3.5 g 0   buPROPion (WELLBUTRIN XL) 150 MG 24 hr  tablet Take by mouth.     buPROPion (WELLBUTRIN XL) 300 MG 24 hr tablet Take by mouth.     clobetasol (TEMOVATE) 0.05 % GEL Apply 1 Application topically 2 (two) times daily. 30 g 1   clonazePAM (KLONOPIN) 0.5 MG tablet Take by mouth.     Coenzyme Q10 (COQ10 PO) Take 1 capsule by mouth daily.     desvenlafaxine (PRISTIQ) 50 MG 24 hr tablet Take 50 mg by mouth daily.     diclofenac Sodium (VOLTAREN) 1 % GEL Apply 4 g topically 4 (four) times daily. 50 g PRN   fluticasone (CUTIVATE) 0.05 % cream Apply topically daily. Thin layer to eyelid once a day at bedtime for 5 days. If needed, may repeat after 2 day break. Restart if rash reoccurs.     Green Tea, Camellia sinensis, (GREEN TEA EXTRACT) 150 MG CAPS See admin instructions.     lamoTRIgine (LAMICTAL) 150 MG tablet Take 2 tablets (300 mg total) by mouth daily. 60 tablet 0   Multiple Vitamin (MULTIVITAMIN WITH MINERALS) TABS Take 1 tablet by mouth daily.     Omega-3 Fatty Acids (FISH OIL PO) Take 1 capsule by mouth daily.      Probiotic Product (PROBIOTIC DAILY PO) Take 1 tablet by mouth daily.     simvastatin (ZOCOR) 20 MG tablet TAKE 1 TABLET  BY MOUTH EVERY DAY IN THE EVENING 90 tablet 3   VITAMIN C, CALCIUM ASCORBATE, PO Take 1 tablet by mouth daily.     VITAMIN D, CHOLECALCIFEROL, PO Take 1 tablet by mouth daily.      Acetylcarnitine HCl (ACETYL L-CARNITINE) 500 MG CAPS Take 1 capsule by mouth daily.     No current facility-administered medications on file prior to visit.     The following portions of the patient's history were reviewed and updated as appropriate: allergies, current medications, past family history, past medical history, past social history, past surgical history and problem list.  ROS Otherwise as in subjective above    Objective: BP 110/70   Pulse 89   Temp 98 F (36.7 C)   Resp 16   Wt 171 lb 12.8 oz (77.9 kg)   LMP 10/18/2010   SpO2 98%   BMI 24.65 kg/m   General appearance: alert, no distress, well  developed, well nourished HEENT: normocephalic, sclerae anicteric, conjunctiva pink and moist, TMs flat, nares with erythema, turbinate edema and mucoid discharge, pharynx with erythema and some blood-tinged mucus postnasal drainage present Oral cavity: MMM, no lesions Neck: supple, no lymphadenopathy, no thyromegaly, no masses Heart: RRR, normal S1, S2, no murmurs Lungs: Coarse breath sounds, no wheezes, +rhonchi, or rales Pulses: 2+ radial pulses, 2+ pedal pulses, normal cap refill Ext: no edema   Assessment: Encounter Diagnoses  Name Primary?   Acute cough Yes   Flu-like symptoms    Respiratory tract infection    Purulent postnasal drainage      Plan: Given your symptoms including purulent blood-tinged mucus and ongoing symptoms, use the following recommendations: Drink at least 80 to 100 ounces of water daily Consider using Mucinex DM twice daily over-the-counter for the next 5 days Begin Z-Pak antibiotic Rest You can continue some Tylenol to help with discomfort and headache If not much improved towards the end of the week then let us know   Ishia was seen today for cough.  Diagnoses and all orders for this visit:  Acute cough -     POC COVID-19 -     POCT Influenza A/B  Flu-like symptoms -     POC COVID-19 -     POCT Influenza A/B  Respiratory tract infection  Purulent postnasal drainage  Other orders -     azithromycin (ZITHROMAX) 250 MG tablet; 2 tablets day 1, then 1 tablet days 2-4 -     dextromethorphan-guaiFENesin (MUCINEX DM) 30-600 MG 12hr tablet; Take 1 tablet by mouth 2 (two) times daily.    Follow up: prn

## 2023-05-30 ENCOUNTER — Encounter: Payer: Self-pay | Admitting: Nurse Practitioner

## 2023-05-30 ENCOUNTER — Other Ambulatory Visit: Payer: Self-pay | Admitting: Nurse Practitioner

## 2023-05-31 ENCOUNTER — Telehealth: Payer: Self-pay

## 2023-05-31 ENCOUNTER — Encounter: Payer: Self-pay | Admitting: Nurse Practitioner

## 2023-05-31 ENCOUNTER — Other Ambulatory Visit: Payer: Self-pay

## 2023-05-31 MED ORDER — PREDNISONE 20 MG PO TABS: 20.0000 mg | ORAL_TABLET | Freq: Every day | ORAL | 0 refills | Status: DC

## 2023-05-31 NOTE — Telephone Encounter (Signed)
 Copied from CRM [id number]. Topic: General - Other >> May 31, 2023  9:55 AM Patsy Lager T wrote: Reason for CRM: patient called stated if provider wants her to get a chest xray she would like to have it done at [facility].   Did you want pt. To get a chest X-ray?

## 2023-05-31 NOTE — Telephone Encounter (Signed)
 No chest x-ray recommended at this time. If antibiotic treatment and steroids are not effective we can consider this in the future.

## 2023-06-03 ENCOUNTER — Other Ambulatory Visit: Payer: Self-pay

## 2023-06-03 ENCOUNTER — Encounter: Payer: Self-pay | Admitting: Nurse Practitioner

## 2023-06-03 MED ORDER — AMOXICILLIN-POT CLAVULANATE 875-125 MG PO TABS: 1.0000 | ORAL_TABLET | Freq: Two times a day (BID) | ORAL | 0 refills | Status: DC

## 2023-07-15 ENCOUNTER — Ambulatory Visit (INDEPENDENT_AMBULATORY_CARE_PROVIDER_SITE_OTHER): Payer: Medicare Other | Admitting: Nurse Practitioner

## 2023-07-15 ENCOUNTER — Encounter: Payer: Self-pay | Admitting: Nurse Practitioner

## 2023-07-15 VITALS — BP 122/74 | HR 91 | Ht 70.0 in | Wt 170.4 lb

## 2023-07-15 DIAGNOSIS — M170 Bilateral primary osteoarthritis of knee: Secondary | ICD-10-CM

## 2023-07-15 DIAGNOSIS — H9193 Unspecified hearing loss, bilateral: Secondary | ICD-10-CM

## 2023-07-15 DIAGNOSIS — G4733 Obstructive sleep apnea (adult) (pediatric): Secondary | ICD-10-CM

## 2023-07-15 DIAGNOSIS — N952 Postmenopausal atrophic vaginitis: Secondary | ICD-10-CM

## 2023-07-15 DIAGNOSIS — Z0000 Encounter for general adult medical examination without abnormal findings: Secondary | ICD-10-CM

## 2023-07-15 DIAGNOSIS — Z6379 Other stressful life events affecting family and household: Secondary | ICD-10-CM | POA: Diagnosis not present

## 2023-07-15 DIAGNOSIS — N189 Chronic kidney disease, unspecified: Secondary | ICD-10-CM

## 2023-07-15 DIAGNOSIS — K5730 Diverticulosis of large intestine without perforation or abscess without bleeding: Secondary | ICD-10-CM | POA: Insufficient documentation

## 2023-07-15 DIAGNOSIS — H9203 Otalgia, bilateral: Secondary | ICD-10-CM

## 2023-07-15 DIAGNOSIS — R7303 Prediabetes: Secondary | ICD-10-CM

## 2023-07-15 DIAGNOSIS — R0982 Postnasal drip: Secondary | ICD-10-CM

## 2023-07-15 DIAGNOSIS — E559 Vitamin D deficiency, unspecified: Secondary | ICD-10-CM

## 2023-07-15 DIAGNOSIS — K2100 Gastro-esophageal reflux disease with esophagitis, without bleeding: Secondary | ICD-10-CM

## 2023-07-15 MED ORDER — ACETIC ACID 2 % OT SOLN: 2.0000 [drp] | Freq: Four times a day (QID) | OTIC | 0 refills | Status: DC

## 2023-07-15 NOTE — Assessment & Plan Note (Signed)
 Chronic postnasal drip with constant throat clearing. Family history of similar symptoms. Possible chronic sinusitis or continuous postnasal drip without clear etiology. ENT appointment scheduled for further evaluation. - Attend ENT appointment for further evaluation and management.

## 2023-07-15 NOTE — Progress Notes (Signed)
 07/15/2023   Vitals:  BP 122/74   Pulse 91   Ht 5\' 10"  (1.778 m)   Wt 170 lb 6.4 oz (77.3 kg)   LMP 10/18/2010   BMI 24.45 kg/m   Body mass index is 24.45 kg/m. Andrea Paul is a 68 y.o. female who presents for Subsequent Medicare Annual Wellness Exam: [employer]  Care Team Members: Current Providers as of 07/15/2023 PCP: Annella Kief, NP Encounter Provider: Annella Kief, NP, starting on Mon Jul 15, 2023 12:00 AM Referring Provider: Annella Kief, NP, starting on Mon Jul 15, 2023 12:00 AM Attending Provider: Annella Kief, NP, starting on Fri Jul 13, 2022 10:00 AM (Active)   Method of visit:  in person In the event virtual visit conducted, the patient consented to a virtual visit. Patient consented to have virtual visit and was identified by two identifiers.  Encounter participants: Patient: Andrea Paul - located AWV Patient Visit Location: In Office Nurse/Provider: Annella Kief - located Virtual Visit Location Provider: Office/Clinic Others (if applicable): patient only  History of Present Illness Andrea Paul is a 68 year old female who presents for a routine wellness visit.  She has been experiencing an earache in her right ear for about a week. She recalls a recent ear infection in the same ear, which was successfully treated with antibiotics. 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  She experiences chronic throat drainage and frequent throat clearing, which she finds embarrassing. Her father and sister have similar symptoms, suggesting a possible familial component. She has an upcoming appointment with an ENT specialist to address this issue.  She is actively working on weight loss, having lost five pounds recently, and aims to lose ten pounds in total. She engages in light weight lifting and walking, and is mindful of her diet and portion  sizes.  She experiences painful intercourse, which has been unresponsive to hormone replacement therapy and physical therapy. Her gynecologist has been involved in her care, but no effective solution has been found. This issue has persisted for over a year.  She is dealing with significant family stress, particularly related to her mother-in-law's health issues, including Parkinson's and dementia, and her nephew's mental health challenges. She and her husband frequently travel to support them, which is emotionally and physically taxing.  Review of Systems:  Neuro: Denies difficulty remembering daily tasks, people, or places.  Ear: Denies difficulty hearing or need to increase volume on television or telephone to hear Eye: Denies visual changes, difficulty reading normal print, or visual field deficits. Cardiac: Denies chest pain, palpitations, dizziness, shortness of breath, pain in lower extremities, or night time waking with shortness of breath. Lung: Denies shortness of breath, difficulty breathing, chronic cough, or dizziness.  GI: Denies changes in bowel habits, blood in stool, difficulty passing stool, decreased intake of food or drink, nausea, or vomiting.  GU: Denies changes in urinary habits, dark urine, malodorous urine, increased or decreased urination, or urinary incontinence.  MSK: Denies weakness in extremities, difficulty walking, difficulty grasping, or new MSK pain.  Skin: Denies changes to the skin, fragile skin, or increased bruising.  Constitution: Denies fatigue, weakness, or confusion.   Patient rating of health: same as this time last year  Clinical Intake: Pre-visit preparation completed: Yes  Pain : No/denies pain     BMI - recorded: 24.45 Nutritional Status: BMI  of 19-24  Normal Nutritional Risks: None Diabetes: No  Activities of Daily Living: Independent Ambulation: Independent Medication Administration: Independent Home Management:  Independent  Barriers to Care Management & Learning: None  Do you feel unsafe in your current relationship?: No Do you feel physically threatened by others?: No Anyone hurting you at home, work, or school?: No Unable to ask?: No Information provided on Community resources: Yes  How often do you need to have someone help you when you read instructions, pamphlets, or other written materials from your doctor or pharmacy?: 1 - Never  Interpreter Needed?: No        07/15/2023    8:37 AM 07/13/2022    8:41 AM 04/18/2022    9:55 AM 08/01/2021   10:38 AM 07/21/2021    5:39 PM 02/20/2021    9:38 AM 12/16/2015    1:49 PM  Advanced Directives  Does Patient Have a Medical Advance Directive? Yes Yes Yes Yes Yes Yes No  Type of Social research officer, government Power of [employer] Power of [place];Living will Healthcare Power of [place];Living will    Does patient want to make changes to medical advance directive?  Yes (ED - Information included in AVS)   No - Patient declined    Copy of Healthcare Power of Attorney in Chart? No - copy requested No - copy requested  No - copy requested No - copy requested    Would patient like information on creating a medical advance directive?       Yes - Transport planner given    Social Determinants of Health SDOH Screenings   Food Insecurity: No Food Insecurity (04/01/2023)   Received from [facility]  Housing: Low Risk  (04/01/2023)   Received from [facility]  Transportation Needs: No Transportation Needs (04/01/2023)   Received from [facility]  Utilities: Not At Risk (04/01/2023)   Received from [facility]  Alcohol Screen: Low Risk  (07/18/2022)  Depression (PHQ2-9): Low Risk  (07/15/2023)  Financial Resource Strain: Low Risk  (04/01/2023)   Received from [facility]  Physical Activity: Unknown (09/09/2022)   Received from [facility], [facility]  Recent Concern: Physical  Activity - Insufficiently Active (07/18/2022)  Social Connections: Moderately Integrated (09/09/2022)   Received from [facility], [facility]  Stress: Stress Concern Present (09/09/2022)   Received from [facility], [facility]  Tobacco Use: Low Risk  (07/15/2023)     Functional Status Survey: Is the patient deaf or have difficulty hearing?: Yes (sometimes) Does the patient have difficulty seeing, even when wearing glasses/contacts?: No Does the patient have difficulty concentrating, remembering, or making decisions?: Yes (memory sometimes) Does the patient have difficulty walking or climbing stairs?: No Does the patient have difficulty dressing or bathing?: No Does the patient have difficulty doing errands alone such as visiting a doctor's office or shopping?: No   Annual Goal:  Goals       Patient Stated      Improve overall health      Patient Stated      I would like to lose 12-15 pounds.       Weight (lb) < 200 lb (90.7 kg) (pt-stated)      Has lost 5lbs towards her total goal. Working towards an additional 5 lbs. And increase self care         Fall Risk    07/15/2023    8:35 AM 12/27/2022   10:11 AM 07/13/2022  8:38 AM 04/09/2022   11:02 AM 07/11/2021    9:10 AM  Fall Risk   Falls in the past year? 0 0 0 0 0  Number falls in past yr: 0 0 0 0 0  Injury with Fall? 0 0 0 0 0  Risk for fall due to : No Fall Risks  No Fall Risks No Fall Risks No Fall Risks  Follow up Falls evaluation completed  Falls evaluation completed Falls evaluation completed Falls evaluation completed     Medicare Risk  Medicare Risk at Home - 07/15/23 0840     Any stairs in or around the home? Yes    If so, are there any without handrails? No    Home free of loose throw rugs in walkways, pet beds, electrical cords, etc? Yes    Adequate lighting in your home to reduce risk of falls? Yes    Life alert? No    Use of a cane, walker or w/c? No    Grab bars in the bathroom? No    Shower  chair or bench in shower? No    Elevated toilet seat or a handicapped toilet? No              Cognitive Function Normal: Yes Exam Completed:       02/20/2021    9:00 AM  Montreal Cognitive Assessment   Visuospatial/ Executive (0/5) 5  Naming (0/3) 3  Attention: Read list of digits (0/2) 2  Attention: Read list of letters (0/1) 1  Attention: Serial 7 subtraction starting at 100 (0/3) 3  Language: Repeat phrase (0/2) 2  Language : Fluency (0/1) 0  Abstraction (0/2) 2  Delayed Recall (0/5) 5  Orientation (0/6) 6  Total 29      07/11/2021    9:12 AM  6CIT Screen  What Year? 0 points  What month? 0 points  What time? 0 points  Count back from 20 0 points  Months in reverse 0 points  Repeat phrase 0 points  Total Score 0 points    Mini-Cog - 07/15/23 0836     Normal clock drawing test? yes    How many words correct? 3             Depression Screening    07/15/2023    8:36 AM 12/27/2022   10:11 AM 07/13/2022    8:38 AM 07/11/2021    9:11 AM 02/21/2021    6:05 PM  Depression screen PHQ 2/9  Decreased Interest 0 0 1 1 0  Down, Depressed, Hopeless 0 0 1 1 0  PHQ - 2 Score 0 0 2 2 0  Altered sleeping   0 0 0  Tired, decreased energy   1 0 0  Change in appetite   0 0 0  Feeling bad or failure about yourself    0 0 0  Trouble concentrating   1 1 0  Moving slowly or fidgety/restless   0 0 0  Suicidal thoughts   0 0 0  PHQ-9 Score   4 3 0  Difficult doing work/chores   Not difficult at all Not difficult at all Not difficult at all     Activities of Daily Living    07/15/2023    8:37 AM  In your present state of health, do you have any difficulty performing the following activities:  Hearing? 1  Comment sometimes  Vision? 0  Difficulty concentrating or making decisions? 1  Comment memory sometimes  Walking or  climbing stairs? 0  Dressing or bathing? 0  Doing errands, shopping? 0  Preparing Food and eating ? N  Using the Toilet? N  In the past six  months, have you accidently leaked urine? N  Do you have problems with loss of bowel control? N  Managing your Medications? N  Managing your Finances? N  Housekeeping or managing your Housekeeping? N    Tobacco Social History   Tobacco Use  Smoking Status Never  Smokeless Tobacco Never     Counseling given: Not Answered   Hospitalizations in the Past Year: none  ED Visits in the Past Year: No  Surgeries in the Past Year: No   History    Medication List Current Meds  Medication Sig   acetic acid 2 % otic solution Place 2-3 drops into both ears 4 (four) times daily. As needed for ear pain.   Acetylcarnitine HCl (ACETYL L-CARNITINE) 500 MG CAPS Take 1 capsule by mouth daily.   ARIPiprazole  (ABILIFY ) 20 MG tablet Take 1 tablet by mouth daily.   clobetasol  (TEMOVATE ) 0.05 % GEL Apply 1 Application topically 2 (two) times daily.   clonazePAM  (KLONOPIN ) 0.5 MG tablet Take by mouth.   Coenzyme Q10 (COQ10 PO) Take 1 capsule by mouth daily.   desvenlafaxine (PRISTIQ) 50 MG 24 hr tablet Take 50 mg by mouth daily.   dextromethorphan-guaiFENesin  (MUCINEX  DM) 30-600 MG 12hr tablet Take 1 tablet by mouth 2 (two) times daily.   diclofenac  Sodium (VOLTAREN ) 1 % GEL Apply 4 g topically 4 (four) times daily.   fluticasone  (CUTIVATE ) 0.05 % cream Apply topically daily. Thin layer to eyelid once a day at bedtime for 5 days. If needed, may repeat after 2 day break. Restart if rash reoccurs.   Green Tea, Camellia sinensis, (GREEN TEA EXTRACT) 150 MG CAPS See admin instructions.   lamoTRIgine  (LAMICTAL ) 150 MG tablet Take 2 tablets (300 mg total) by mouth daily.   Multiple Vitamin (MULTIVITAMIN WITH MINERALS) TABS Take 1 tablet by mouth daily.   Omega-3 Fatty Acids (FISH OIL PO) Take 1 capsule by mouth daily.    Probiotic Product (PROBIOTIC DAILY PO) Take 1 tablet by mouth daily.   simvastatin  (ZOCOR ) 20 MG tablet TAKE 1 TABLET BY MOUTH EVERY DAY IN THE EVENING   VITAMIN C, CALCIUM ASCORBATE,  PO Take 1 tablet by mouth daily.   VITAMIN D , CHOLECALCIFEROL, PO Take 1 tablet by mouth daily.      Immunizations Immunization History  Administered Date(s) Administered   DTaP 04/05/2008   Fluad Quad(high Dose 65+) 12/14/2020   Influenza Split 02/19/2012   Influenza, High Dose Seasonal PF 12/06/2020, 01/04/2023   Influenza,inj,Quad PF,6+ Mos 12/18/2016, 12/24/2016, 01/14/2018, 12/16/2018, 12/08/2019   Influenza,inj,quad, With Preservative 11/26/2013, 12/02/2014   Influenza-Unspecified 03/30/2022   Moderna Covid-19 Fall Seasonal Vaccine 11yrs & older 03/06/2022, 07/06/2022, 01/04/2023   Moderna SARS-COV2 Booster Vaccination 02/10/2020, 08/26/2020   Moderna Sars-Covid-2 Vaccination 06/12/2019, 07/14/2019, 09/09/2019, 02/10/2020   PNEUMOCOCCAL CONJUGATE-20 07/11/2021   Rsv, Bivalent, Protein Subunit Rsvpref,pf Pattricia Bores) 08/03/2022   Tdap 04/05/2008, 08/31/2021   Zoster Recombinant(Shingrix) 08/31/2021, 02/16/2022   Zoster, Live 08/28/2013   Zoster, Unspecified 08/31/2021     Screening Tests Health Maintenance  Topic Date Due   COVID-19 Vaccine (10 - 2024-25 season) 07/31/2023 (Originally 07/05/2023)   INFLUENZA VACCINE  10/18/2023   Medicare Annual Wellness (AWV)  07/14/2024   MAMMOGRAM  04/16/2025   Fecal DNA (Cologuard)  06/29/2025   DTaP/Tdap/Td (4 - Td or Tdap) 09/01/2031   Pneumonia Vaccine  69+ Years old  Completed   DEXA SCAN  Completed   Zoster Vaccines- Shingrix  Completed   HPV VACCINES  Aged Out   Meningococcal B Vaccine  Aged Out   Colonoscopy  Discontinued   Hepatitis C Screening  Discontinued    Health Maintenance Screenings  The patient has no Health Maintenance topics of status Overdue, Due On, or Due Soon   RSV Vaccine: is up to date  Past Medical History:  Diagnosis Date   Abnormal weight gain 07/11/2021   Anemia    history of   Anxiety    does not take daily unless needed   Arthritis    bilateral hands   Atopic dermatitis of left eyelid  10/31/2022   Basal cell carcinoma    Bilateral hearing loss due to cerumen impaction 02/13/2021   Bipolar 1 disorder (HCC)        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    Encounter for medical examination to establish care 02/13/2021   Encounter for Medicare annual wellness exam 02/13/2021   Flatulence, eructation and gas pain 07/11/2021   Heart murmur    History of blood transfusion    age 21 years old   History of periodontal disease    Non-recurrent acute suppurative otitis media of right ear without spontaneous rupture of tympanic membrane 01/14/2023   Pain around toenail, right foot 10/30/2022   Pain in gums 01/16/2013   Post-nasal drip 02/13/2021   Pre-diabetes    Rectal bleeding 07/11/2021   Sleep apnea    no CPAP prescribed, sleeps on sides   Past Surgical History:  Procedure Laterality Date   ANTERIOR CERVICAL DECOMP/DISCECTOMY FUSION  09/17/2011   Procedure: ANTERIOR CERVICAL DECOMPRESSION/DISCECTOMY FUSION 2 LEVELS;  Surgeon: Corrina Dimitri, MD;  Location: [facility];  Service: Neurosurgery;  Laterality: Bilateral;  Cervical five-six, six-seven anterior cervical decompression with fusion plating and bonegraft   BASAL CELL CARCINOMA EXCISION  2010   COLONOSCOPY  2014   DILATATION & CURETTAGE/HYSTEROSCOPY WITH MYOSURE N/A 12/19/2015   Procedure: DILATATION & CURETTAGE/HYSTEROSCOPY WITH MYOSURE;  Surgeon: Lillian Rein, MD;  Location: [facility];  Service: Gynecology;  Laterality: N/A;   GANGLION CYST EXCISION     right wrist   TONSILLECTOMY     Family History  Problem Relation Age of Onset   Dementia Mother 84   Rheum arthritis Mother    Alcohol abuse Father    Anxiety disorder Father    Depression Father    Dementia Father 24   Paranoid behavior Father    CAD Father    Depression Sister    Alcohol abuse Brother    Bipolar disorder Brother    Drug abuse Brother    Sexual abuse Paternal Grandfather    Depression Brother    Depression Sister    OCD Neg Hx     Schizophrenia Neg Hx    Thyroid  disease Neg Hx    Social History   Socioeconomic History   Marital status: Married    Spouse name: Not on file   Number of children: Not on file   Years of education: 18   Highest education level: Not on file  Occupational History   Not on file  Tobacco Use   Smoking status: Never   Smokeless tobacco: Never  Vaping Use   Vaping status: Never Used  Substance and Sexual Activity   Alcohol use: No   Drug use: No    Comment: Caffeine: 2 cups coffee  Sexual activity: Yes    Partners: Male    Birth control/protection: Post-menopausal  Other Topics Concern   Not on file  Social History Narrative   Not on file   Social Drivers of Health   Financial Resource Strain: Low Risk  (04/01/2023)   [facility]   Overall Financial Resource Strain (CARDIA)    Difficulty of Paying Living Expenses: Not hard at all  Food Insecurity: No Food Insecurity (04/01/2023)   Received from [facility]   Hunger Vital Sign    Worried About Running Out of Food in the Last Year: Never true    Ran Out of Food in the Last Year: Never true  Transportation Needs: No Transportation Needs (04/01/2023)   Received from [facility] - Transportation    Lack of Transportation (Medical): No    Lack of Transportation (Non-Medical): No  Physical Activity: Unknown (09/09/2022)   Received from [facility], [facility]   Exercise Vital Sign    Days of Exercise per Week: 0 days    Minutes of Exercise per Session: Not on file  Recent Concern: Physical Activity - Insufficiently Active (07/18/2022)   Exercise Vital Sign    Days of Exercise per Week: 3 days    Minutes of Exercise per Session: 20 min  Stress: Stress Concern Present (09/09/2022)   Received from [facility], [facility] - Occupational Stress Questionnaire    Feeling of Stress : To some extent  Social Connections: Moderately Integrated (09/09/2022)    [facility] from [facility], [facility]   Social Network    How would you rate your social network (family, work, friends)?: Adequate participation with social networks    Outpatient Encounter Medications as of 07/15/2023  Medication Sig   acetic acid 2 % otic solution Place 2-3 drops into both ears 4 (four) times daily. As needed for ear pain.   Acetylcarnitine HCl (ACETYL L-CARNITINE) 500 MG CAPS Take 1 capsule by mouth daily.   ARIPiprazole  (ABILIFY ) 20 MG tablet Take 1 tablet by mouth daily.   clobetasol  (TEMOVATE ) 0.05 % GEL Apply 1 Application topically 2 (two) times daily.   clonazePAM  (KLONOPIN ) 0.5 MG tablet Take by mouth.   Coenzyme Q10 (COQ10 PO) Take 1 capsule by mouth daily.   desvenlafaxine (PRISTIQ) 50 MG 24 hr tablet Take 50 mg by mouth daily.   dextromethorphan-guaiFENesin  (MUCINEX  DM) 30-600 MG 12hr tablet Take 1 tablet by mouth 2 (two) times daily.   diclofenac  Sodium (VOLTAREN ) 1 % GEL Apply 4 g topically 4 (four) times daily.   fluticasone  (CUTIVATE ) 0.05 % cream Apply topically daily. Thin layer to eyelid once a day at bedtime for 5 days. If needed, may repeat after 2 day break. Restart if rash reoccurs.   Green Tea, Camellia sinensis, (GREEN TEA EXTRACT) 150 MG CAPS See admin instructions.   lamoTRIgine  (LAMICTAL ) 150 MG tablet Take 2 tablets (300 mg total) by mouth daily.   Multiple Vitamin (MULTIVITAMIN WITH MINERALS) TABS Take 1 tablet by mouth daily.   Omega-3 Fatty Acids (FISH OIL PO) Take 1 capsule by mouth daily.    Probiotic Product (PROBIOTIC DAILY PO) Take 1 tablet by mouth daily.   simvastatin  (ZOCOR ) 20 MG tablet TAKE 1 TABLET BY MOUTH EVERY DAY IN THE EVENING   VITAMIN C, CALCIUM ASCORBATE, PO Take 1 tablet by mouth daily.   VITAMIN D , CHOLECALCIFEROL, PO Take 1 tablet by mouth daily.    azithromycin  (ZITHROMAX )  250 MG tablet 2 tablets day 1, then 1 tablet days 2-4 (Patient not taking: Reported on 07/15/2023)   bacitracin -polymyxin b  (POLYSPORIN )  ophthalmic ointment Place 1 Application into the left eye every 12 (twelve) hours. Use for 3-5 days. (Patient not taking: Reported on 07/15/2023)   buPROPion (WELLBUTRIN XL) 150 MG 24 hr tablet Take by mouth.   buPROPion (WELLBUTRIN XL) 300 MG 24 hr tablet Take by mouth.   predniSONE  (DELTASONE ) 20 MG tablet Take 1 tablet (20 mg total) by mouth daily with breakfast. Take for 5 days (Patient not taking: Reported on 07/15/2023)   [DISCONTINUED] amoxicillin -clavulanate (AUGMENTIN ) 875-125 MG tablet Take 1 tablet by mouth 2 (two) times daily. (Patient not taking: Reported on 07/15/2023)   No facility-administered encounter medications on file as of 07/15/2023.    Physical Exam: Not applicable Physical Exam HENT:     Head:     Comments: Cerumen present bilaterally. Once removed erythema and irritation noted in the canals bilaterally.     Ears:     Comments: Clear effusion bilaterally.     Nose: Rhinorrhea present.     Mouth/Throat:     Mouth: Mucous membranes are moist.  Eyes:     Conjunctiva/sclera: Conjunctivae normal.  Cardiovascular:     Rate and Rhythm: Normal rate and regular rhythm.     Pulses: Normal pulses.     Heart sounds: Normal heart sounds.  Pulmonary:     Effort: Pulmonary effort is normal.     Breath sounds: Normal breath sounds.  Abdominal:     General: Bowel sounds are normal.     Palpations: Abdomen is soft.  Musculoskeletal:        General: Normal range of motion.     Cervical back: Normal range of motion.  Skin:    General: Skin is warm and dry.     Capillary Refill: Capillary refill takes less than 2 seconds.  Neurological:     General: No focal deficit present.     Mental Status: She is oriented to person, place, and time.  Psychiatric:        Mood and Affect: Mood normal.     PLAN  Exercise Activities and Dietary Recommendations - choose a type of activity I enjoy such as biking, gardening, team sports, walking Regular diet  Fall Prevention - always  use handrails on the stairs - always wear shoes or slippers with non-slip sole - get at least 10 minutes of activity every day  Orders Placed This Encounter  Procedures   Hemoglobin A1c    Standing Status:   Future    Expiration Date:   07/14/2024    Release to patient:   Immediate   CBC with Differential/Platelet    Standing Status:   Future    Expiration Date:   07/14/2024    Release to patient:   Immediate   Comprehensive metabolic panel with GFR    Standing Status:   Future    Expiration Date:   07/14/2024    Has the patient fasted?:   Yes    Release to patient:   Immediate   Lipid panel    Standing Status:   Future    Expiration Date:   07/14/2024    Has the patient fasted?:   Yes    Release to patient:   Immediate   VITAMIN D  25 Hydroxy (Vit-D Deficiency, Fractures)    Standing Status:   Future    Expiration Date:   07/14/2024  Release to patient:   Immediate   Ambulatory referral to Audiology    Referral Priority:   Routine    Referral Type:   Audiology Exam    Referral Reason:   Specialty Services Required    Number of Visits Requested:   1     I have personally reviewed and noted the following in the patient's chart:   Medical and social history Use of alcohol, tobacco or illicit drugs  Current medications and supplements Functional ability and status Nutritional status Physical activity Advanced directives List of other physicians Hospitalizations, surgeries, and ER visits in previous 12 months Vitals Screenings to include cognitive, depression, and falls Referrals and appointments  In addition, I have reviewed and discussed with patient certain preventive protocols, quality metrics, and best practice recommendations. A written personalized care plan for preventive services as well as general preventive health recommendations were provided to patient.   Annella Kief, NP  07/15/2023

## 2023-07-15 NOTE — Assessment & Plan Note (Signed)
 Routine wellness visit with stable health status compared to last year. Discussed family stressors, including caring for elderly in-laws and a nephew with mental health issues. Emphasized self-care amidst these responsibilities. Actively working on weight loss, having lost five pounds through light weight lifting and walking. - Continue current exercise regimen and dietary modifications to achieve weight loss goal. - Encourage self-care and stress management strategies.

## 2023-07-15 NOTE — Assessment & Plan Note (Signed)
 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 Hormonal replacement and physical therapy have been ineffective. Vaginal tissues are very thin, leading to bleeding and severe pain. She and her spouse have adapted to other forms of intimacy. - Continue to explore alternative methods of intimacy.

## 2023-07-15 NOTE — Patient Instructions (Addendum)
 I recommend looking into:  *  Medicaid for your nephew.  *  Palliative Care services for your mother-in-law. This may be under the umbrella of hospice, but is a different service and may be able to help provide support, equipment, and guidance on her care.

## 2023-07-15 NOTE — Assessment & Plan Note (Signed)
 Intermittent hearing difficulty possibly related to ear wax buildup. Narrow ear canals. Mild redness and fluid behind both ears, but no signs of infection. Reports earache in the right ear for about a week. - Prescribe ear drops for both ears to address redness and fluid. - Referral for formal hearing evaluation.

## 2023-07-16 ENCOUNTER — Ambulatory Visit

## 2023-07-16 DIAGNOSIS — Z6379 Other stressful life events affecting family and household: Secondary | ICD-10-CM

## 2023-07-16 DIAGNOSIS — K2100 Gastro-esophageal reflux disease with esophagitis, without bleeding: Secondary | ICD-10-CM

## 2023-07-16 DIAGNOSIS — H9203 Otalgia, bilateral: Secondary | ICD-10-CM

## 2023-07-16 DIAGNOSIS — G4733 Obstructive sleep apnea (adult) (pediatric): Secondary | ICD-10-CM

## 2023-07-16 DIAGNOSIS — Z0000 Encounter for general adult medical examination without abnormal findings: Secondary | ICD-10-CM

## 2023-07-16 DIAGNOSIS — N952 Postmenopausal atrophic vaginitis: Secondary | ICD-10-CM

## 2023-07-16 DIAGNOSIS — R7303 Prediabetes: Secondary | ICD-10-CM

## 2023-07-16 DIAGNOSIS — E559 Vitamin D deficiency, unspecified: Secondary | ICD-10-CM

## 2023-07-16 DIAGNOSIS — M170 Bilateral primary osteoarthritis of knee: Secondary | ICD-10-CM

## 2023-07-16 LAB — LIPID PANEL

## 2023-07-17 ENCOUNTER — Encounter (INDEPENDENT_AMBULATORY_CARE_PROVIDER_SITE_OTHER): Payer: Self-pay | Admitting: Otolaryngology

## 2023-07-17 ENCOUNTER — Ambulatory Visit (INDEPENDENT_AMBULATORY_CARE_PROVIDER_SITE_OTHER): Admitting: Otolaryngology

## 2023-07-17 VITALS — BP 143/86 | HR 92 | Ht 70.0 in | Wt 167.0 lb

## 2023-07-17 DIAGNOSIS — R0981 Nasal congestion: Secondary | ICD-10-CM | POA: Diagnosis not present

## 2023-07-17 DIAGNOSIS — K219 Gastro-esophageal reflux disease without esophagitis: Secondary | ICD-10-CM

## 2023-07-17 DIAGNOSIS — J312 Chronic pharyngitis: Secondary | ICD-10-CM

## 2023-07-17 DIAGNOSIS — R0982 Postnasal drip: Secondary | ICD-10-CM | POA: Diagnosis not present

## 2023-07-17 DIAGNOSIS — J342 Deviated nasal septum: Secondary | ICD-10-CM | POA: Diagnosis not present

## 2023-07-17 DIAGNOSIS — J343 Hypertrophy of nasal turbinates: Secondary | ICD-10-CM

## 2023-07-17 DIAGNOSIS — J3089 Other allergic rhinitis: Secondary | ICD-10-CM

## 2023-07-17 LAB — COMPREHENSIVE METABOLIC PANEL WITH GFR
ALT: 18 IU/L (ref 0–32)
AST: 17 IU/L (ref 0–40)
Albumin: 4.5 g/dL (ref 3.9–4.9)
Alkaline Phosphatase: 52 IU/L (ref 44–121)
BUN/Creatinine Ratio: 19 (ref 12–28)
BUN: 15 mg/dL (ref 8–27)
Bilirubin Total: 0.2 mg/dL (ref 0.0–1.2)
CO2: 22 mmol/L (ref 20–29)
Calcium: 9 mg/dL (ref 8.7–10.3)
Chloride: 99 mmol/L (ref 96–106)
Creatinine, Ser: 0.79 mg/dL (ref 0.57–1.00)
Globulin, Total: 1.9 g/dL (ref 1.5–4.5)
Glucose: 109 mg/dL — ABNORMAL HIGH (ref 70–99)
Potassium: 4.2 mmol/L (ref 3.5–5.2)
Sodium: 137 mmol/L (ref 134–144)
Total Protein: 6.4 g/dL (ref 6.0–8.5)
eGFR: 82 mL/min/{1.73_m2} (ref >59)

## 2023-07-17 LAB — CBC WITH DIFFERENTIAL/PLATELET
Basophils Absolute: 0 10*3/uL (ref 0.0–0.2)
Basos: 1 %
EOS (ABSOLUTE): 0 10*3/uL (ref 0.0–0.4)
Eos: 0 %
Hematocrit: 41.3 % (ref 34.0–46.6)
Hemoglobin: 13.7 g/dL (ref 11.1–15.9)
Immature Grans (Abs): 0 10*3/uL (ref 0.0–0.1)
Immature Granulocytes: 0 %
Lymphocytes Absolute: 1.2 10*3/uL (ref 0.7–3.1)
Lymphs: 26 %
MCH: 31.4 pg (ref 26.6–33.0)
MCHC: 33.2 g/dL (ref 31.5–35.7)
MCV: 95 fL (ref 79–97)
Monocytes Absolute: 0.5 10*3/uL (ref 0.1–0.9)
Monocytes: 10 %
Neutrophils Absolute: 2.8 10*3/uL (ref 1.4–7.0)
Neutrophils: 63 %
Platelets: 195 10*3/uL (ref 150–450)
RBC: 4.36 x10E6/uL (ref 3.77–5.28)
RDW: 11.8 % (ref 11.7–15.4)
WBC: 4.5 10*3/uL (ref 3.4–10.8)

## 2023-07-17 LAB — LIPID PANEL
Cholesterol, Total: 180 mg/dL (ref 100–199)
HDL: 66 mg/dL (ref >39)
LDL CALC COMMENT:: 2.7 ratio (ref 0.0–4.4)
LDL Chol Calc (NIH): 92 mg/dL (ref 0–99)
Triglycerides: 129 mg/dL (ref 0–149)
VLDL Cholesterol Cal: 22 mg/dL (ref 5–40)

## 2023-07-17 LAB — HEMOGLOBIN A1C
Est. average glucose Bld gHb Est-mCnc: 131 mg/dL
Hgb A1c MFr Bld: 6.2 % — ABNORMAL HIGH (ref 4.8–5.6)

## 2023-07-17 LAB — VITAMIN D 25 HYDROXY (VIT D DEFICIENCY, FRACTURES): Vit D, 25-Hydroxy: 35.9 ng/mL (ref 30.0–100.0)

## 2023-07-17 MED ORDER — LEVOCETIRIZINE DIHYDROCHLORIDE 5 MG PO TABS: 5.0000 mg | ORAL_TABLET | Freq: Every evening | ORAL | 3 refills | Status: DC

## 2023-07-17 MED ORDER — FLUTICASONE PROPIONATE 50 MCG/ACT NA SUSP: 2.0000 | Freq: Every day | NASAL | 6 refills | Status: DC

## 2023-07-17 NOTE — Progress Notes (Signed)
 ENT CONSULT:  Reason for Consult: chronic nasal congestion  post-nasal drip chronic throat clearing   HPI: Discussed the use of AI scribe software for clinical note transcription with the patient, who gave verbal consent to proceed.  History of Present Illness Andrea Paul is a 68 year old female who presents with chronic throat drainage and frequent throat clearing.  She experiences constant drainage down the back of her throat and frequent throat clearing throughout the day. These symptoms have persisted for years without any testing for seasonal or environmental allergies.  She previously tried levocetirizine for a couple of months but discontinued it due to lack of efficacy. She has never used a nasal spray for her symptoms.  She recalls being prescribed prednisone  in the past for a viral infection but does not currently use it. No sinus infections are reported.  She has a history of heartburn and reflux, for which she previously took Nexium. She has not used Nexium in a couple of years and reports no recent issues with acid reflux.  She was evaluated for sleep apnea in 2015 and was advised to sleep on her sides to avoid apnea episodes, not on CPAP.  Records Reviewed:  Dr Tellis Feathers office visit 2015  I personally reviewed her sleep study and CPAP titration. All of her sleep apnea events occurred on her back. CPAP did not control her apnea. On exam today, there is not a clear anatomic cause that is amenable to surgery. However, a potential non-surgical option for her is to wear a shirt designed to keep her off her back. She will explore this option and give it a try. Her husband should be able to assess results. I also suggested she discuss her medications with her psychiatrist to see if sedation can be minimized. Follow-up as needed.   Obstructive sleep apnea (327.23) (G47.33). Discussed  I personally reviewed her sleep study and CPAP titration. All of her sleep apnea events  occurred on her back. CPAP did not control her apnea. On exam today, there is not a clear anatomic cause that is amenable to surgery. However, a potential non-surgical option for her is to wear a shirt designed to keep her off her back. She will explore this option and give it a try. Her husband should be able to assess results. I also suggested she discuss her medications with her psychiatrist to see if sedation can be minimized. Follow-up as needed. Reason For Visit  Andrea Paul is here today at the kind request of Robie Cho for consultation and opinion. She is being seen for sleep apnea. HPI  Her psychiatrist asked her about her sleep. She reported that her husband had noticed that she gasped for air when sleeping on her back. Her psychiatrist suggested a sleep study. She underwent a diagnostic study that showed an AHI of 14.9, lowest oxygen saturation of 79 and REM AHI of 40. A CPAP titration was then performed early this month but her sleep apnea was not able to be controlled with CPAP.     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    Encounter for medical examination to establish care 02/13/2021   Encounter for Medicare annual wellness exam 02/13/2021   Flatulence, eructation and gas pain 07/11/2021   Heart murmur    History of blood transfusion    age 68 years old   History of periodontal disease    Non-recurrent acute suppurative otitis media of right ear without spontaneous rupture of tympanic membrane 01/14/2023   Pain around toenail, right foot 10/30/2022   Pain in gums 01/16/2013   Post-nasal drip 02/13/2021   Pre-diabetes    Rectal  bleeding 07/11/2021   Sleep apnea    no CPAP prescribed, sleeps on sides    Past Surgical History:  Procedure Laterality Date   ANTERIOR CERVICAL DECOMP/DISCECTOMY FUSION  09/17/2011   Procedure: ANTERIOR CERVICAL DECOMPRESSION/DISCECTOMY FUSION 2 LEVELS;  Surgeon: Corrina Dimitri, MD;  Location: [facility];  Service: Neurosurgery;  Laterality: Bilateral;  Cervical five-six, six-seven anterior cervical decompression with fusion plating and bonegraft   BASAL CELL CARCINOMA EXCISION  2010   COLONOSCOPY  2014   DILATATION & CURETTAGE/HYSTEROSCOPY WITH MYOSURE N/A 12/19/2015   Procedure: DILATATION & CURETTAGE/HYSTEROSCOPY WITH MYOSURE;  Surgeon: Lillian Rein, MD;  Location: [facility];  Service: Gynecology;  Laterality: N/A;   GANGLION CYST EXCISION     right wrist   TONSILLECTOMY      Family History  Problem Relation Age of Onset   Dementia Mother 44   Rheum arthritis Mother    Alcohol abuse Father    Anxiety disorder Father    Depression Father    Dementia Father 39   Paranoid behavior Father    CAD Father    Depression Sister    Alcohol abuse Brother    Bipolar disorder Brother    Drug abuse Brother    Sexual abuse Paternal Grandfather    Depression Brother    Depression Sister    OCD Neg Hx    Schizophrenia Neg Hx    Thyroid  disease Neg Hx     Social History:  reports that she has never smoked. She has never used smokeless tobacco. She reports that she does not drink alcohol and does not use drugs.  Allergies:  Allergies  Allergen Reactions   Latex Itching    Medications: I have reviewed the patient's current medications.  The PMH, PSH, Medications, Allergies, and SH were reviewed and updated.  ROS: Constitutional: Negative for fever, weight loss and weight gain. Cardiovascular: Negative for chest pain and dyspnea on exertion. Respiratory: Is not experiencing shortness of breath at rest. Gastrointestinal: Negative for nausea and vomiting. Neurological:  Negative for headaches. Psychiatric: The patient is not nervous/anxious  Blood pressure (!) 143/86, pulse 92, height 5\' 10"  (1.778 m), weight 167 lb (75.8 kg), last menstrual period 10/18/2010, SpO2 96%. Body mass index is 23.96 kg/m.  PHYSICAL EXAM:  Exam: General: Well-developed, well-nourished Respiratory Respiratory effort: Equal inspiration and expiration without stridor Cardiovascular Peripheral Vascular: Warm extremities with equal color/perfusion Eyes: No nystagmus with equal extraocular motion bilaterally Neuro/Psych/Balance: Patient oriented to person, place, and time; Appropriate mood and affect; Gait is intact with no imbalance; Cranial nerves I-XII are intact Head and Face Inspection: Normocephalic and atraumatic without mass or lesion Palpation: Facial skeleton intact without bony stepoffs Salivary Glands: No mass or tenderness Facial Strength: Facial motility symmetric and full bilaterally ENT Pinna: External ear intact and fully developed External canal: Canal is patent with intact skin Tympanic Membrane: Clear and mobile  External Nose: No scar or anatomic deformity Internal Nose: Septum is with slight left sided septal deviation. No polyp, or purulence. Mucosal edema and erythema present.  Bilateral inferior turbinate hypertrophy.  Lips, Teeth, and gums: Mucosa and teeth intact and viable TMJ: No pain to palpation with full mobility Oral cavity/oropharynx: No erythema or exudate, no lesions present Nasopharynx: No mass or lesion with intact mucosa Hypopharynx: Intact mucosa without pooling of secretions Larynx Glottic: Full true vocal cord mobility without lesion or mass Supraglottic: Normal appearing epiglottis and AE folds scant white mucus along right false fold and A-E fold  Interarytenoid Space: Moderate pachydermia&edema Subglottic Space: Patent without lesion or edema Neck Neck and Trachea: Midline trachea without mass or lesion Thyroid : No mass or  nodularity Lymphatics: No lymphadenopathy  Procedure: Preoperative diagnosis: chronic throat clearing   Postoperative diagnosis:   Same  Procedure: Flexible fiberoptic laryngoscopy + GERD LPR  Surgeon: Clif Serio, MD  Anesthesia: Topical lidocaine  and Afrin Complications: None Condition is stable throughout exam  Indications and consent:  The patient presents to the clinic with above symptoms. Indirect laryngoscopy view was incomplete. Thus it was recommended that they undergo a flexible fiberoptic laryngoscopy. All of the risks, benefits, and potential complications were reviewed with the patient preoperatively and verbal informed consent was obtained.  Procedure: The patient was seated upright in the clinic. Topical lidocaine  and Afrin were applied to the nasal cavity. After adequate anesthesia had occurred, I then proceeded to pass the flexible telescope into the nasal cavity. The nasal cavity was patent without rhinorrhea or polyp. The nasopharynx was also patent without mass or lesion. The base of tongue was visualized and was normal. There were no signs of pooling of secretions in the piriform sinuses. The true vocal folds were mobile bilaterally. There were no signs of glottic or supraglottic mucosal lesion or mass. There was moderate interarytenoid pachydermia and post cricoid edema. The telescope was then slowly withdrawn and the patient tolerated the procedure throughout.      PROCEDURE NOTE: nasal endoscopy  Preoperative diagnosis: chronic nasal congestion and post-nasal drip symptoms  Postoperative diagnosis: same  Procedure: Diagnostic nasal endoscopy (16109)  Surgeon: Artice Last, M.D.  Anesthesia: Topical lidocaine  and Afrin  H&P REVIEW: The patient's history and physical were reviewed today prior to procedure. All medications were reviewed and updated as well. Complications: None Condition is stable throughout exam Indications and consent: The patient  presents with symptoms of chronic sinusitis not responding to previous therapies. All the risks, benefits, and potential complications were reviewed with the patient preoperatively and informed consent was obtained. The time out was completed with confirmation of the correct procedure.   Procedure: The patient was seated upright in the clinic. Topical lidocaine  and Afrin were applied to the nasal cavity. After adequate anesthesia had occurred, the rigid nasal endoscope was passed into the nasal cavity. The nasal mucosa, turbinates, septum, and sinus drainage pathways were visualized bilaterally. This revealed no purulence or significant secretions that might be cultured. There were no polyps or sites of significant inflammation. The mucosa was intact and there was no crusting present. The scope was then slowly withdrawn and the patient tolerated the procedure well. There were no complications or blood loss.  Studies Reviewed: MRI brain 03/22/21 Sinuses/Orbits: Visualized orbits show no acute finding. Minimal mucosal thickening within the bilateral ethmoid sinuses.   Other: Trace fluid within the right mastoid air cells.    Assessment/Plan: Encounter Diagnoses  Name Primary?   Environmental and seasonal allergies  Yes   Chronic nasal congestion    Chronic sore throat    Post-nasal drip    Chronic GERD    Nasal septal deviation    Hypertrophy of both inferior nasal turbinates     Assessment and Plan Assessment & Plan Chronic throat clearing  Chronic throat clearing and mucus likely due to silent reflux vs post-nasal drainage. Throat swelling consistent with reflux irritation. Caffeine may exacerbate symptoms.Flexible scope exam w/o masses or tumors, but evidence of scant mucus likely from post-nasal drainage, and findings c/w GERD LPR - Advise dietary modifications to reduce reflux, including reducing caffeine intake. - Recommend seaweed supplement Reflux Gourmet after meals - Educated on  reflux-free diet and provided website resources on diet and lifestyle changes  Chronic nasal congestion Postnasal drainage suspected Environmental allergies  Chronic postnasal drip likely exacerbated by environmental allergens. Septal deviation/iTH noted on nasal endoscopy but nasal passages fairly open and there were no polyps or purulence noted. We discussed allergy testing but the patient is hesitant about allergy testing due to fear of needles. - Prescribed Flonase  nasal spray up to twice daily. - Recommend Xyzal  at night to avoid drowsiness. - Advise nasal saline rinses with distilled water and salt packets. - Discuss potential for future allergy testing if symptoms persist.      Thank you for allowing me to participate in the care of this patient. Please do not hesitate to contact me with any questions or concerns.   Artice Last, MD Otolaryngology [facility] Phone: [phone] Fax: [phone]    07/17/2023, 3:53 PM

## 2023-07-17 NOTE — Patient Instructions (Addendum)
 Andres Bangs Med Nasal Saline Rinse   - start nasal saline rinses with NeilMed Bottle available over the counter or online to help with nasal congestion    - start Flonase  Xyzal    [email or web address] - check out this website to learn more about reflux   -Avoid lying down for at least two hours after a meal or after drinking acidic beverages, like soda, or other caffeinated beverages. This can help to prevent stomach contents from flowing back into the esophagus. -Keep your head elevated while you sleep. Using an extra pillow or two can also help to prevent reflux. -Eat smaller and more frequent meals each day instead of a few large meals. This promotes digestion and can aid in preventing heartburn. -Wear loose-fitting clothes to ease pressure on the stomach, which can worsen heartburn and reflux. -Reduce excess weight around the midsection. This can ease pressure on the stomach. Such pressure can force some stomach contents back up the esophagus - Take Reflux Gourmet (natural supplement available on Amazon) to help with symptoms of chronic throat irritation

## 2023-07-25 ENCOUNTER — Encounter: Payer: Self-pay | Admitting: Nurse Practitioner

## 2023-08-01 ENCOUNTER — Ambulatory Visit: Admitting: Family Medicine

## 2023-08-01 ENCOUNTER — Encounter: Payer: Self-pay | Admitting: Family Medicine

## 2023-08-01 VITALS — BP 122/80 | HR 95 | Wt 169.4 lb

## 2023-08-01 DIAGNOSIS — H6592 Unspecified nonsuppurative otitis media, left ear: Secondary | ICD-10-CM

## 2023-08-01 MED ORDER — AMOXICILLIN-POT CLAVULANATE 875-125 MG PO TABS
1.0000 | ORAL_TABLET | Freq: Two times a day (BID) | ORAL | 0 refills | Status: DC
Start: 2023-08-01 — End: 2023-08-08

## 2023-08-01 NOTE — Progress Notes (Signed)
   Subjective:    Patient ID: Andrea Paul, female    DOB: 08/21/1955, 68 y.o.   MRN: [id number]  HPI She is here for evaluation of continued difficulty with hearing from the left ear.  She feels a muffled sensation.  When last seen here she was given drops and she states that they did not help.   Review of Systems     Objective:    Physical Exam After cerumen was removed from the left canal, the canal was normal however the tympanic membrane was erythematous with poor landmarks.       Assessment & Plan:  Left non-suppurative otitis media - Plan: amoxicillin -clavulanate (AUGMENTIN ) 875-125 MG tablet I will treat with Augmentin .  She is to call in 10 days to let us  know how she is doing.  If she is better not over I think given another course of antibiotic would be useful otherwise she might need to be seen or possibly referred to ENT.

## 2023-08-05 ENCOUNTER — Encounter: Payer: Self-pay | Admitting: Nurse Practitioner

## 2023-08-07 ENCOUNTER — Other Ambulatory Visit: Payer: Self-pay | Admitting: Nurse Practitioner

## 2023-08-07 DIAGNOSIS — E782 Mixed hyperlipidemia: Secondary | ICD-10-CM

## 2023-08-08 ENCOUNTER — Ambulatory Visit: Admitting: Audiologist

## 2023-08-08 ENCOUNTER — Encounter: Payer: Self-pay | Admitting: Family Medicine

## 2023-08-08 DIAGNOSIS — H6592 Unspecified nonsuppurative otitis media, left ear: Secondary | ICD-10-CM

## 2023-08-08 MED ORDER — AMOXICILLIN-POT CLAVULANATE 875-125 MG PO TABS: 1.0000 | ORAL_TABLET | Freq: Two times a day (BID) | ORAL | 0 refills | Status: DC

## 2023-08-22 ENCOUNTER — Telehealth: Payer: Self-pay | Admitting: Nurse Practitioner

## 2023-08-22 NOTE — Telephone Encounter (Signed)
 Copied from CRM [id number]. Topic: General - Other >> Aug 22, 2023 10:52 AM Carlatta H wrote: Reason for CRM: Patient saw Dr Robina Chol for ear and was advised once back from out of town if the issue was not better to call and schedule an appointment//Appointment was schedule with Dr Robina Chol for 6/11 >> Aug 22, 2023 11:30 AM Rosamond Comes wrote: Patient returning missed call, unsure of what exactly what the message was. Patient will call back after listening to the message again   Left message for pt to call if she would like to try for sooner appt

## 2023-08-26 ENCOUNTER — Ambulatory Visit: Admitting: Medical

## 2023-08-26 VITALS — BP 120/76 | HR 64 | Temp 97.1°F | Ht 70.0 in | Wt 169.6 lb

## 2023-08-26 DIAGNOSIS — H68009 Unspecified Eustachian salpingitis, unspecified ear: Secondary | ICD-10-CM

## 2023-08-26 DIAGNOSIS — H6120 Impacted cerumen, unspecified ear: Secondary | ICD-10-CM | POA: Diagnosis not present

## 2023-08-26 MED ORDER — PSEUDOEPHEDRINE HCL 60 MG PO TABS: 60.0000 mg | ORAL_TABLET | Freq: Two times a day (BID) | ORAL | 0 refills | Status: AC

## 2023-08-26 MED ORDER — PREDNISONE 20 MG PO TABS: ORAL_TABLET | ORAL | 0 refills | Status: AC

## 2023-08-26 NOTE — Progress Notes (Signed)
 Subjective:  Andrea Paul is a 68 y.o. female who presents for Chief Complaint  Patient presents with   Acute Visit    Follow-up on Ear pain. Still having issues. Been on 2 rounds of antibitics     Here for recheck on ear pain.   Still aching but has seen some improvement.   Has seen Dr. Robina Chol for this recently.  Been on 2 rounds of antibiotics from recent ear infection.  Has been on Augmentin  x 2 rounds.  Initially felt like she was underwater, hearing was muffled.  That has improved.  But still has some ear ache.    No fever, no cough, no sinus pain.   Has some post nasal drip.  Has seen ENT prior for this.  Uses nasal spray, xyzal .  No other aggravating or relieving factors.    No other c/o.  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    Encounter for medical examination to establish care 02/13/2021   Encounter for Medicare annual wellness exam 02/13/2021   Flatulence, eructation and gas pain 07/11/2021   Heart murmur    History of blood transfusion    age 46 years old   History of periodontal disease    Non-recurrent acute suppurative otitis media of right ear without spontaneous rupture of tympanic membrane 01/14/2023   Pain around toenail, right foot 10/30/2022   Pain in gums 01/16/2013   Post-nasal drip 02/13/2021   Pre-diabetes    Rectal bleeding 07/11/2021   Sleep apnea    no CPAP prescribed, sleeps on sides   Current Outpatient Medications on File Prior to Visit  Medication Sig Dispense Refill   Acetylcarnitine HCl (ACETYL L-CARNITINE) 500 MG CAPS Take 1 capsule by mouth daily.     ARIPiprazole  (ABILIFY ) 20 MG tablet  Take 1 tablet by mouth daily.     buPROPion (WELLBUTRIN XL) 150 MG 24 hr tablet Take by mouth.     buPROPion (WELLBUTRIN XL) 300 MG 24 hr tablet Take by mouth.     clobetasol  (TEMOVATE ) 0.05 % GEL Apply 1 Application topically 2 (two) times daily. 30 g 1   clonazePAM  (KLONOPIN ) 0.5 MG tablet Take by mouth.     Coenzyme Q10 (COQ10 PO) Take 1 capsule by mouth daily.     desvenlafaxine (PRISTIQ) 50 MG 24 hr tablet Take 50 mg by mouth daily.     dextromethorphan-guaiFENesin  (MUCINEX  DM) 30-600 MG 12hr tablet Take 1 tablet by mouth 2 (two) times daily. 20 tablet 0   diclofenac  Sodium (VOLTAREN ) 1 % GEL Apply 4 g topically 4 (four) times daily. 50 g PRN   fluticasone  (CUTIVATE ) 0.05 % cream Apply topically daily. Thin layer to eyelid once a day at bedtime for 5 days. If needed, may repeat after 2 day break. Restart if rash reoccurs.     fluticasone  (FLONASE ) 50 MCG/ACT nasal spray Place 2 sprays into both nostrils daily. 16 g 6   Green Tea, Camellia sinensis, (GREEN TEA EXTRACT) 150 MG CAPS See admin instructions.     lamoTRIgine  (LAMICTAL )  150 MG tablet Take 2 tablets (300 mg total) by mouth daily. 60 tablet 0   levocetirizine (XYZAL  ALLERGY 24HR) 5 MG tablet Take 1 tablet (5 mg total) by mouth every evening. 30 tablet 3   Multiple Vitamin (MULTIVITAMIN WITH MINERALS) TABS Take 1 tablet by mouth daily.     Omega-3 Fatty Acids (FISH OIL PO) Take 1 capsule by mouth daily.      Probiotic Product (PROBIOTIC DAILY PO) Take 1 tablet by mouth daily.     simvastatin  (ZOCOR ) 20 MG tablet TAKE 1 TABLET BY MOUTH DAILY IN THE EVENING 90 tablet 3   VITAMIN C, CALCIUM ASCORBATE, PO Take 1 tablet by mouth daily.     VITAMIN D , CHOLECALCIFEROL, PO Take 1 tablet by mouth daily.      No current facility-administered medications on file prior to visit.     The following portions of the patient's history were reviewed and updated as appropriate: allergies, current medications, past family history, past medical  history, past social history, past surgical history and problem list.  ROS Otherwise as in subjective above    Objective: BP 120/76   Pulse 64   Temp (!) 97.1 F (36.2 C)   Ht 5\' 10"  (1.778 m)   Wt 169 lb 9.6 oz (76.9 kg)   LMP 10/18/2010   BMI 24.34 kg/m   General appearance: alert, no distress, well developed, well nourished HEENT: normocephalic, sclerae anicteric, conjunctiva pink and moist, right ear canal with moderate cerumen.  Left TM with some air fluid levels.  No erythema. Nares patent, no discharge or erythema, pharynx normal    Assessment: Encounter Diagnoses  Name Primary?   Salpingitis of Eustachian tube, unspecified laterality Yes   Cerumen in auditory canal on examination      Plan: We discussed her exam findings and concerns.  She already completed 2 courses of Augmentin  antibiotic.  Given symptoms today I will have her do Sudafed 1/2 to 1 tablet twice a day for the next 3 to 5 days.  She can continue Flonase  and Xyzal .  Be good about water intake.  If not much improved by the relief she can have the prednisone .  If either 1 of these does not help she will call back and let us  know.  Consider topical eardrop with steroid as a third option  She can also use over-the-counter eardrops for her right ear.  She declined ear lavage as she had an ear lavage here within the last few months gave her some discomfort.  [name] was seen today for acute visit.  Diagnoses and all orders for this visit:  Salpingitis of Eustachian tube, unspecified laterality  Cerumen in auditory canal on examination  Other orders -     pseudoephedrine (SUDAFED) 60 MG tablet; Take 1 tablet (60 mg total) by mouth in the morning and at bedtime. -     predniSONE  (DELTASONE ) 20 MG tablet; 3 tablets today, 2 tablets tomorrow, 1 tablet the third day    Follow up: prn

## 2023-08-28 ENCOUNTER — Ambulatory Visit: Admitting: Family Medicine

## 2023-09-10 ENCOUNTER — Ambulatory Visit: Admitting: Audiologist

## 2023-09-30 ENCOUNTER — Encounter (INDEPENDENT_AMBULATORY_CARE_PROVIDER_SITE_OTHER): Payer: Self-pay | Admitting: Otolaryngology

## 2023-09-30 ENCOUNTER — Ambulatory Visit (INDEPENDENT_AMBULATORY_CARE_PROVIDER_SITE_OTHER): Admitting: Otolaryngology

## 2023-09-30 VITALS — BP 127/83 | HR 89

## 2023-09-30 DIAGNOSIS — R0981 Nasal congestion: Secondary | ICD-10-CM

## 2023-09-30 DIAGNOSIS — M26622 Arthralgia of left temporomandibular joint: Secondary | ICD-10-CM

## 2023-09-30 DIAGNOSIS — H9202 Otalgia, left ear: Secondary | ICD-10-CM | POA: Diagnosis not present

## 2023-09-30 DIAGNOSIS — R0982 Postnasal drip: Secondary | ICD-10-CM

## 2023-09-30 DIAGNOSIS — J3089 Other allergic rhinitis: Secondary | ICD-10-CM

## 2023-09-30 NOTE — Patient Instructions (Signed)
  TMJ (Temporomandibular Joint Syndrome) The temporomandibular (tem-puh-roe-man-DIB-u-lur) joint (TMJ) acts like a sliding hinge, connecting your jawbone to your skull. You have one joint on each side of your jaw. TMJ disorders -- a type of temporomandibular disorder or TMD -- can cause pain in your jaw joint and in the muscles that control jaw movement.  The exact cause of a person's TMJ disorder is often difficult to determine. Your pain may be due to a combination of factors, such as genetics, arthritis or jaw injury. Some people who have jaw pain also tend to clench or grind their teeth (bruxism), although many people habitually clench or grind their teeth and never develop TMJ disorders.  In most cases, the pain and discomfort associated with TMJ disorders is temporary and can be relieved with self-managed care or nonsurgical treatments. This includes stress reduction, softer diet when the pain is present, anti-inflammatory pain medications such as Motrin and warm compresses.

## 2023-09-30 NOTE — Progress Notes (Signed)
 ENT Progress Note:   Update 09/30/2023  Discussed the use of AI scribe software for clinical note transcription with the patient, who gave verbal consent to proceed.  History of Present Illness Andrea Paul is a 68 year old female who presents with persistent left ear pain.  She has been experiencing persistent left ear pain despite multiple visits to her primary care physician. The pain is described as dull and is more pronounced in the morning and late afternoon. She has been prescribed Acetic Acid  ear drops, Augmentin , and prednisone , but she reports that these treatments have not resolved her symptoms. Tylenol  provides some relief, but the pain recurs.  No ear drainage is noted, and chewing or eating does not exacerbate the pain.   Records Reviewed:  Initial Evaluation  Reason for Consult: chronic nasal congestion  post-nasal drip chronic throat clearing   HPI: Discussed the use of AI scribe software for clinical note transcription with the patient, who gave verbal consent to proceed.  History of Present Illness Andrea Paul is a 68 year old female who presents with chronic throat drainage and frequent throat clearing.  She experiences constant drainage down the back of her throat and frequent throat clearing throughout the day. These symptoms have persisted for years without any testing for seasonal or environmental allergies.  She previously tried levocetirizine for a couple of months but discontinued it due to lack of efficacy. She has never used a nasal spray for her symptoms.  She recalls being prescribed prednisone  in the past for a viral infection but does not currently use it. No sinus infections are reported.  She has a history of heartburn and reflux, for which she previously took Nexium. She has not used Nexium in a couple of years and reports no recent issues with acid reflux.  She was evaluated for sleep apnea in 2015 and was advised to sleep on  her sides to avoid apnea episodes, not on CPAP.    Dr Carlie office visit 2015  I personally reviewed her sleep study and CPAP titration. All of her sleep apnea events occurred on her back. CPAP did not control her apnea. On exam today, there is not a clear anatomic cause that is amenable to surgery. However, a potential non-surgical option for her is to wear a shirt designed to keep her off her back. She will explore this option and give it a try. Her husband should be able to assess results. I also suggested she discuss her medications with her psychiatrist to see if sedation can be minimized. Follow-up as needed.   Obstructive sleep apnea (327.23) (G47.33). Discussed  I personally reviewed her sleep study and CPAP titration. All of her sleep apnea events occurred on her back. CPAP did not control her apnea. On exam today, there is not a clear anatomic cause that is amenable to surgery. However, a potential non-surgical option for her is to wear a shirt designed to keep her off her back. She will explore this option and give it a try. Her husband should be able to assess results. I also suggested she discuss her medications with her psychiatrist to see if sedation can be minimized. Follow-up as needed. Reason For Visit  Andrea Paul is here today at the kind request of Ferdie Nest for consultation and opinion. She is being seen for sleep apnea. HPI  Her psychiatrist asked her about her sleep. She reported that her husband had noticed that she gasped for air when sleeping on her  back. Her psychiatrist suggested a sleep study. She underwent a diagnostic study that showed an AHI of 14.9, lowest oxygen saturation of 79 and REM AHI of 40. A CPAP titration was then performed early this month but her sleep apnea was not able to be controlled with CPAP.     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    Encounter for medical examination to establish care 02/13/2021   Encounter for Medicare annual wellness exam 02/13/2021   Flatulence, eructation and gas pain 07/11/2021   Heart murmur    History of blood transfusion    age 40 years old   History of periodontal disease    Non-recurrent acute suppurative otitis media of right ear without spontaneous rupture of tympanic membrane 01/14/2023   Pain around toenail, right foot 10/30/2022   Pain in gums 01/16/2013   Post-nasal drip 02/13/2021   Pre-diabetes    Rectal bleeding 07/11/2021   Sleep apnea    no CPAP prescribed, sleeps on sides    Past Surgical History:  Procedure Laterality Date   ANTERIOR CERVICAL DECOMP/DISCECTOMY FUSION  09/17/2011   Procedure: ANTERIOR CERVICAL DECOMPRESSION/DISCECTOMY FUSION 2 LEVELS;  Surgeon: Lamar LELON Peaches, MD;  Location: [facility];  Service: Neurosurgery;  Laterality: Bilateral;  Cervical five-six, six-seven anterior cervical decompression with fusion plating and bonegraft   BASAL CELL CARCINOMA EXCISION  2010   COLONOSCOPY  2014   DILATATION & CURETTAGE/HYSTEROSCOPY WITH MYOSURE N/A 12/19/2015   Procedure: DILATATION & CURETTAGE/HYSTEROSCOPY WITH MYOSURE;  Surgeon: Ronal GORMAN Pinal, MD;  Location: [facility];  Service: Gynecology;  Laterality: N/A;   GANGLION CYST EXCISION     right wrist   TONSILLECTOMY      Family History  Problem Relation Age of Onset   Dementia Mother 86   Rheum arthritis Mother    Alcohol abuse Father    Anxiety disorder Father    Depression Father    Dementia Father 36   Paranoid behavior Father    CAD Father    Depression Sister    Alcohol abuse Brother    Bipolar disorder Brother    Drug abuse Brother    Sexual abuse  Paternal Grandfather    Depression Brother    Depression Sister    OCD Neg Hx    Schizophrenia Neg Hx    Thyroid  disease Neg Hx     Social History:  reports that she has never smoked. She has never used smokeless tobacco. She reports that she does not drink alcohol and does not use drugs.  Allergies:  Allergies  Allergen Reactions   Latex Itching    Medications: I have reviewed the patient's current medications.  The PMH, PSH, Medications, Allergies, and SH were reviewed and updated.  ROS: Constitutional: Negative for fever, weight loss and weight gain. Cardiovascular: Negative for chest pain and dyspnea on exertion. Respiratory: Is not experiencing shortness of breath at rest. Gastrointestinal: Negative for nausea and vomiting. Neurological: Negative for headaches. Psychiatric: The patient is not nervous/anxious  Blood pressure 127/83, pulse 89, last  menstrual period 10/18/2010, SpO2 96%. There is no height or weight on file to calculate BMI.  PHYSICAL EXAM:  Exam: General: Well-developed, well-nourished Respiratory Respiratory effort: Equal inspiration and expiration without stridor Cardiovascular Peripheral Vascular: Warm extremities with equal color/perfusion Eyes: No nystagmus with equal extraocular motion bilaterally Neuro/Psych/Balance: Patient oriented to person, place, and time; Appropriate mood and affect; Gait is intact with no imbalance; Cranial nerves I-XII are intact Head and Face Inspection: Normocephalic and atraumatic without mass or lesion Palpation: Facial skeleton intact without bony stepoffs Salivary Glands: No mass or tenderness Facial Strength: Facial motility symmetric and full bilaterally ENT Pinna: External ear intact and fully developed External canal: Canal is patent with intact skin Tympanic Membrane: Clear and mobile External Nose: No scar or anatomic deformity Internal Nose: Septum is with slight left sided septal deviation. No polyp,  or purulence. Mucosal edema and erythema present.  Bilateral inferior turbinate hypertrophy.  Lips, Teeth, and gums: Mucosa and teeth intact and viable TMJ:  Mild pain to palpation with full mobility on both sides L > R Oral cavity/oropharynx: No erythema or exudate, no lesions present Neck Neck and Trachea: Midline trachea without mass or lesion Thyroid : No mass or nodularity Lymphatics: No lymphadenopathy  Studies Reviewed: MRI brain 03/22/21 Sinuses/Orbits: Visualized orbits show no acute finding. Minimal mucosal thickening within the bilateral ethmoid sinuses.   Other: Trace fluid within the right mastoid air cells.    Assessment/Plan: Encounter Diagnoses  Name Primary?   Environmental and seasonal allergies Yes   Chronic nasal congestion    Post-nasal drip    Otalgia of left ear    Arthralgia of left temporomandibular joint      Assessment and Plan Assessment & Plan Chronic throat clearing  Chronic throat clearing and mucus likely due to silent reflux vs post-nasal drainage. Throat swelling consistent with reflux irritation. Caffeine may exacerbate symptoms.Flexible scope exam w/o masses or tumors, but evidence of scant mucus likely from post-nasal drainage, and findings c/w GERD LPR - Advise dietary modifications to reduce reflux, including reducing caffeine intake. - Recommend seaweed supplement Reflux Gourmet after meals - Educated on reflux-free diet and provided website resources on diet and lifestyle changes  Chronic nasal congestion Postnasal drainage suspected Environmental allergies  Chronic postnasal drip likely exacerbated by environmental allergens. Septal deviation/iTH noted on nasal endoscopy but nasal passages fairly open and there were no polyps or purulence noted. We discussed allergy testing but the patient is hesitant about allergy testing due to fear of needles. - Prescribed Flonase  nasal spray up to twice daily. - Recommend Xyzal  at night to avoid  drowsiness. - Advise nasal saline rinses with distilled water and salt packets. - Discuss potential for future allergy testing if symptoms persist.   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 Conservative management recommended due to NSAID intolerance.  - Provided TMJ management handout. - Recommended warm compresses. - Advised softer diet during flare-ups. - Continue Tylenol , as long as maximum daily dose has not been reached - Consider oral surgeon referral for injections if pain worsens.  Chronic nasal congestion  - continue current management    Thank you for allowing me to participate in the care of this patient. Please do not hesitate to contact me with any questions or concerns.   Elena Larry, MD [facility] Phone: [phone] Fax: [phone]    09/30/2023, 11:14 AM

## 2023-10-08 ENCOUNTER — Ambulatory Visit (INDEPENDENT_AMBULATORY_CARE_PROVIDER_SITE_OTHER): Admitting: Otolaryngology

## 2023-10-10 ENCOUNTER — Ambulatory Visit (INDEPENDENT_AMBULATORY_CARE_PROVIDER_SITE_OTHER): Admitting: Otolaryngology

## 2023-11-15 ENCOUNTER — Ambulatory Visit (INDEPENDENT_AMBULATORY_CARE_PROVIDER_SITE_OTHER): Admitting: Otolaryngology

## 2023-11-22 ENCOUNTER — Ambulatory Visit (INDEPENDENT_AMBULATORY_CARE_PROVIDER_SITE_OTHER): Admitting: Otolaryngology

## 2024-01-10 ENCOUNTER — Ambulatory Visit (HOSPITAL_BASED_OUTPATIENT_CLINIC_OR_DEPARTMENT_OTHER): Payer: Medicare Other | Admitting: Obstetrics & Gynecology

## 2024-03-02 ENCOUNTER — Other Ambulatory Visit (INDEPENDENT_AMBULATORY_CARE_PROVIDER_SITE_OTHER): Payer: Self-pay | Admitting: Otolaryngology

## 2024-03-05 ENCOUNTER — Telehealth: Payer: Self-pay

## 2024-03-05 NOTE — Telephone Encounter (Signed)
 You want me to tell her they can see Dr. Vita or Ludie Gent because they are currently taking new pts.   Copied from CRM #[id number]. Topic: Appointments - Scheduling Inquiry for Clinic >> Mar 05, 2024  9:44 AM China J wrote: Reason for CRM: he patient was told by her provider that if she had a friend or family member that needs to be seen to notify her.   She has two friends named Eligha and Doyal Pedlar who would like to establish. They are geriatric patients and are in desperate need for help. Please call the patient at [phone] for an update on Sara's decision.

## 2024-03-20 ENCOUNTER — Other Ambulatory Visit: Payer: Self-pay | Admitting: Nurse Practitioner

## 2024-04-08 ENCOUNTER — Telehealth: Payer: Self-pay | Admitting: Nurse Practitioner

## 2024-04-08 ENCOUNTER — Other Ambulatory Visit: Payer: Self-pay

## 2024-04-08 ENCOUNTER — Telehealth (INDEPENDENT_AMBULATORY_CARE_PROVIDER_SITE_OTHER): Payer: Self-pay | Admitting: Otolaryngology

## 2024-04-08 MED ORDER — FLUTICASONE PROPIONATE 50 MCG/ACT NA SUSP: 2.0000 | Freq: Every day | NASAL | 3 refills | Status: AC

## 2024-04-08 NOTE — Telephone Encounter (Signed)
 Medication is Fluticasone  Propionate.

## 2024-04-08 NOTE — Telephone Encounter (Signed)
 Fluticasone  prop 50 mcg spray  Last filled 03/02/24

## 2024-04-08 NOTE — Telephone Encounter (Signed)
 Called patient back and let them know that we would need to see them in office and get them established with a new provider before we could send out a refill (per Dr. Roark). Patient understood and stated they had to think about it and they would call me back and let me know if she wanted to be seen with a new provider.

## 2024-04-08 NOTE — Telephone Encounter (Signed)
 Patient called and stated that her Pharmacy has sent 3 refill requests for her nasal spray prescribed by Dr. Soldatova and they have not received a reply.  Please send in refill for Fluticisone Propianate.  Patient can be reached at [phone]

## 2024-04-16 IMAGING — DX DG KNEE COMPLETE 4+V*L*
4 series · 4 of 4 positions shown · non-contrast
Comparison: None Available.

CLINICAL DATA: Knee pain, no known injury, initial encounter

EXAM:
LEFT KNEE - COMPLETE 4+ VIEW

[knee ap]
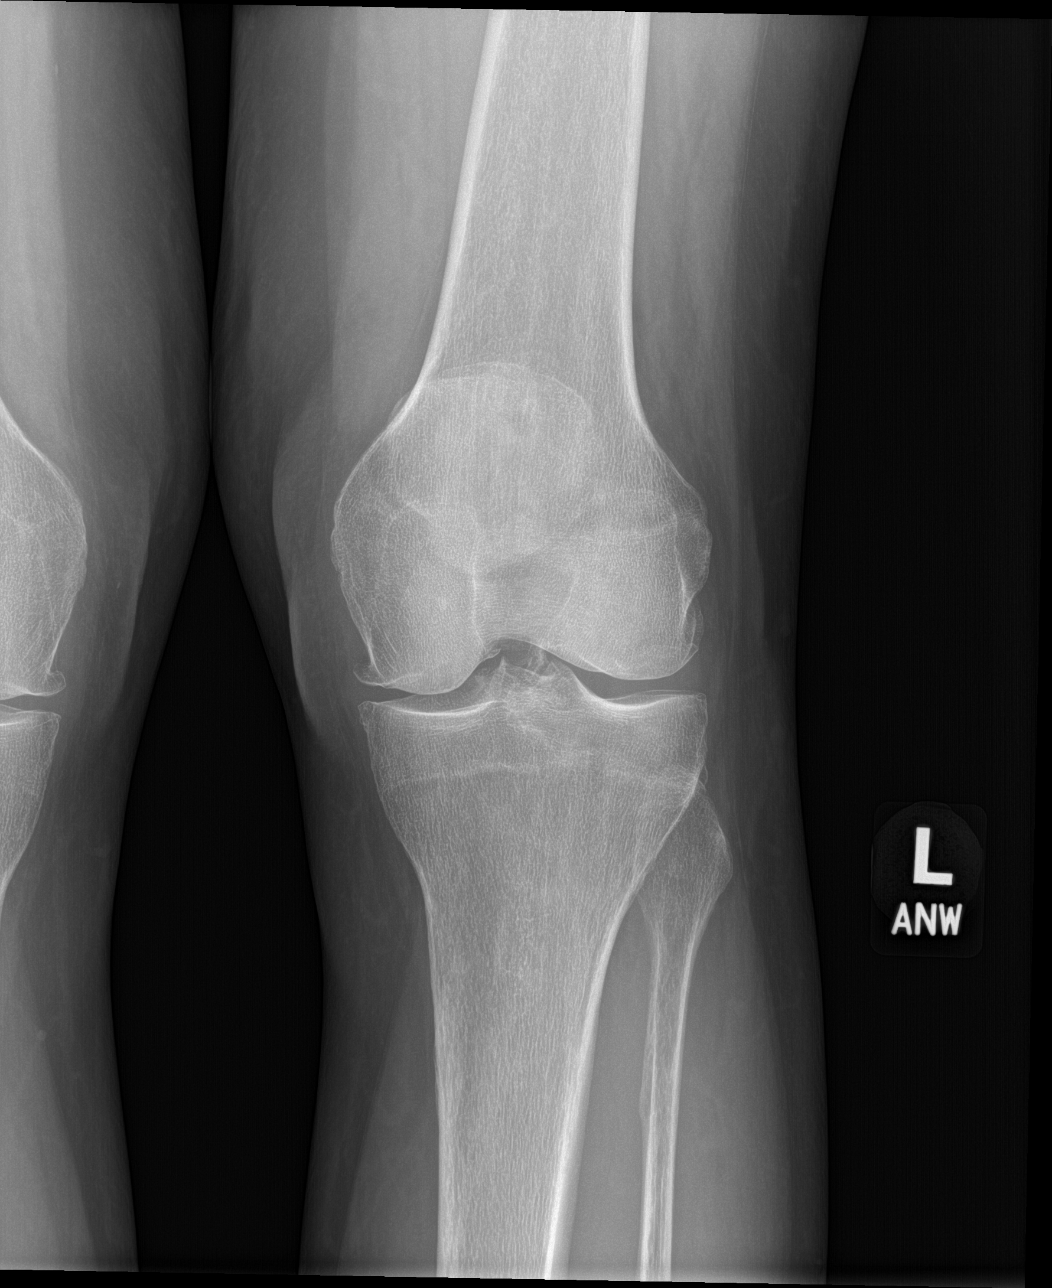

[knee tunnel]
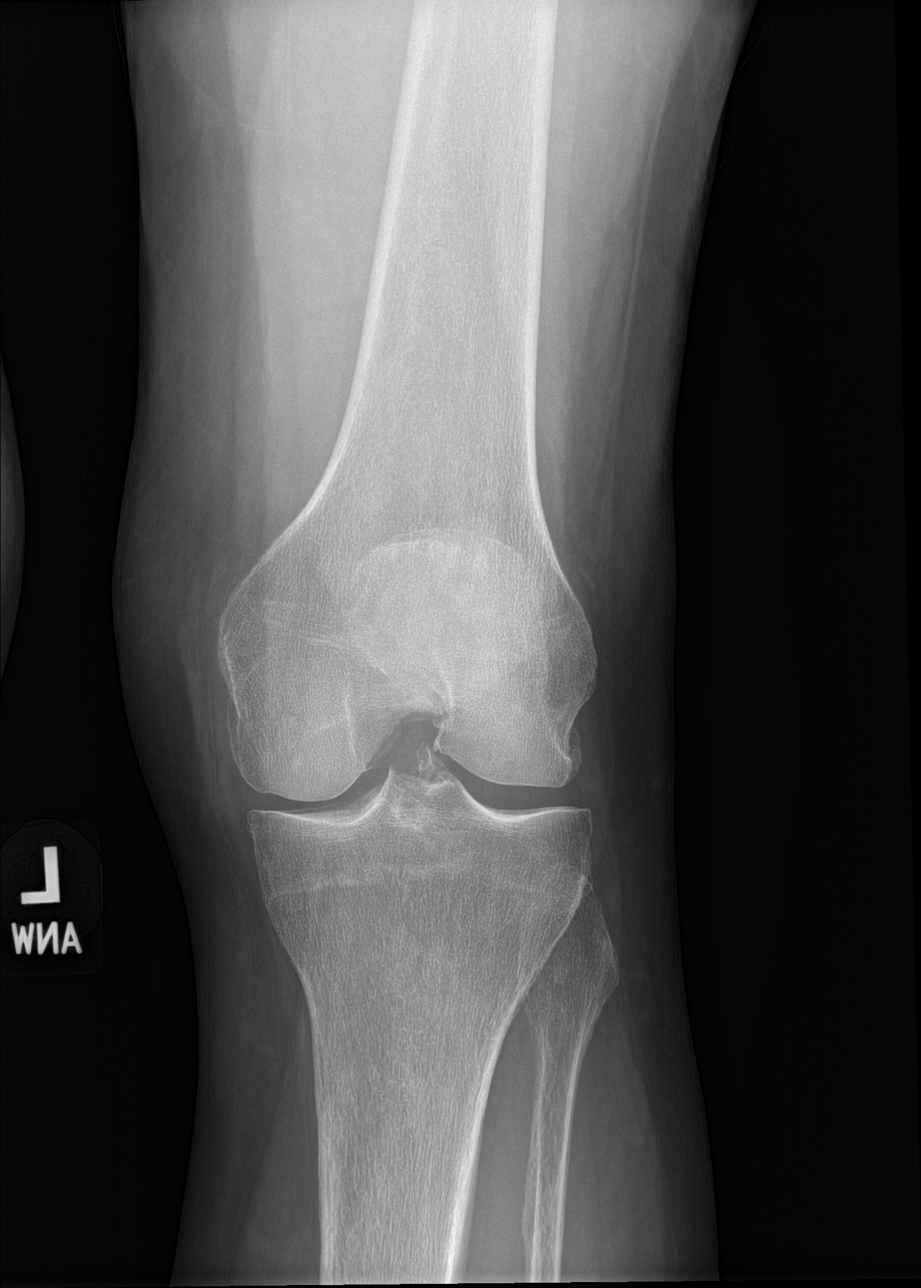

[patella sunrise]
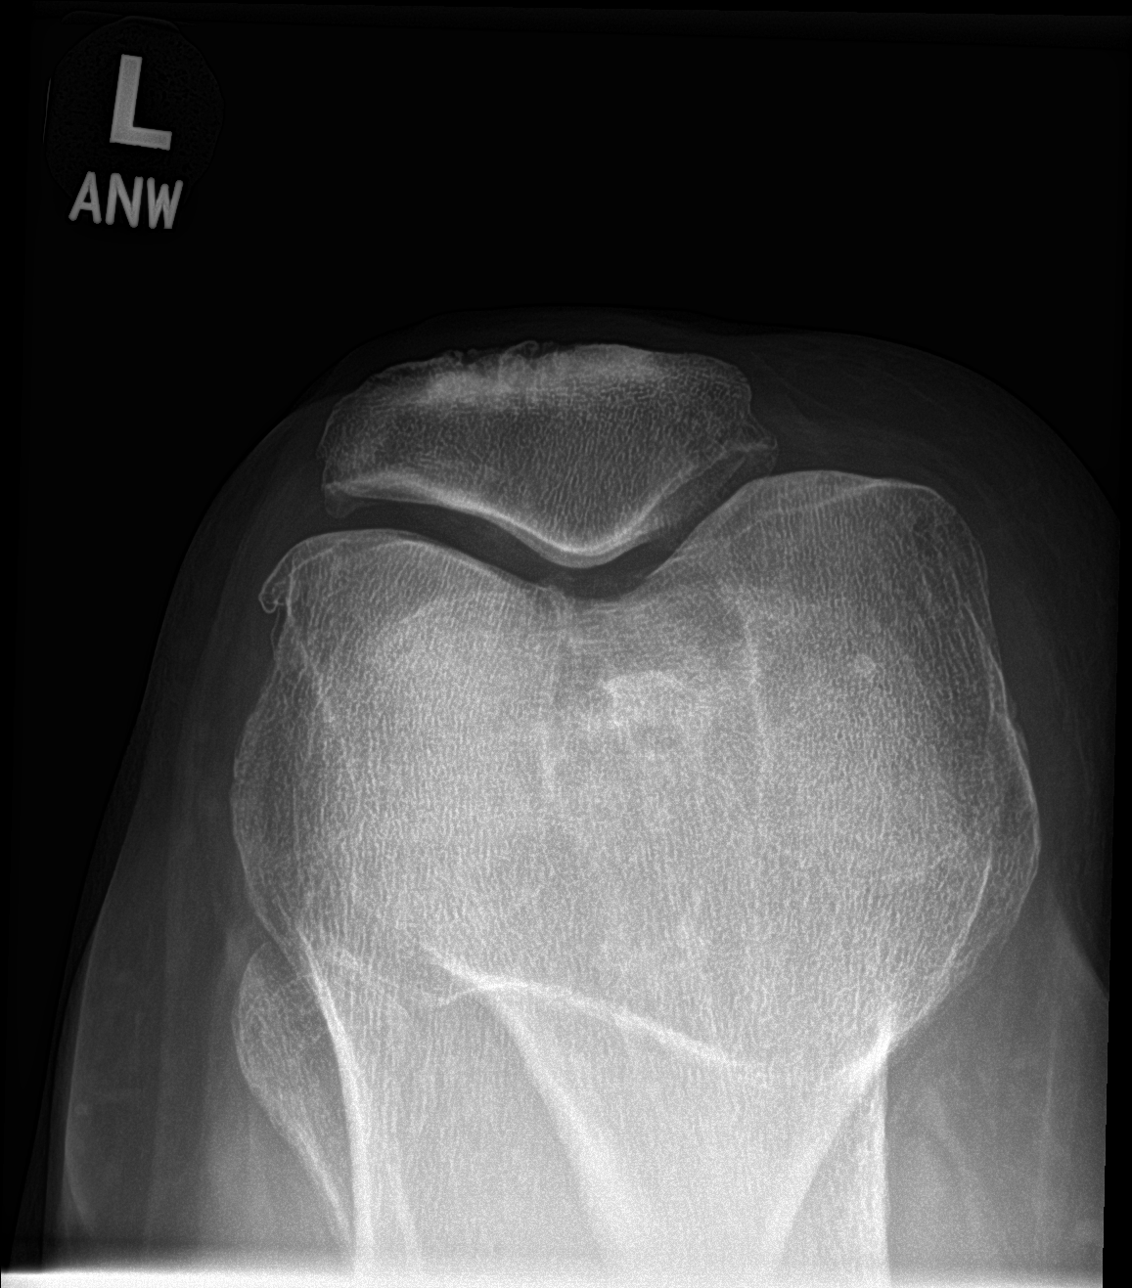

[knee lat]
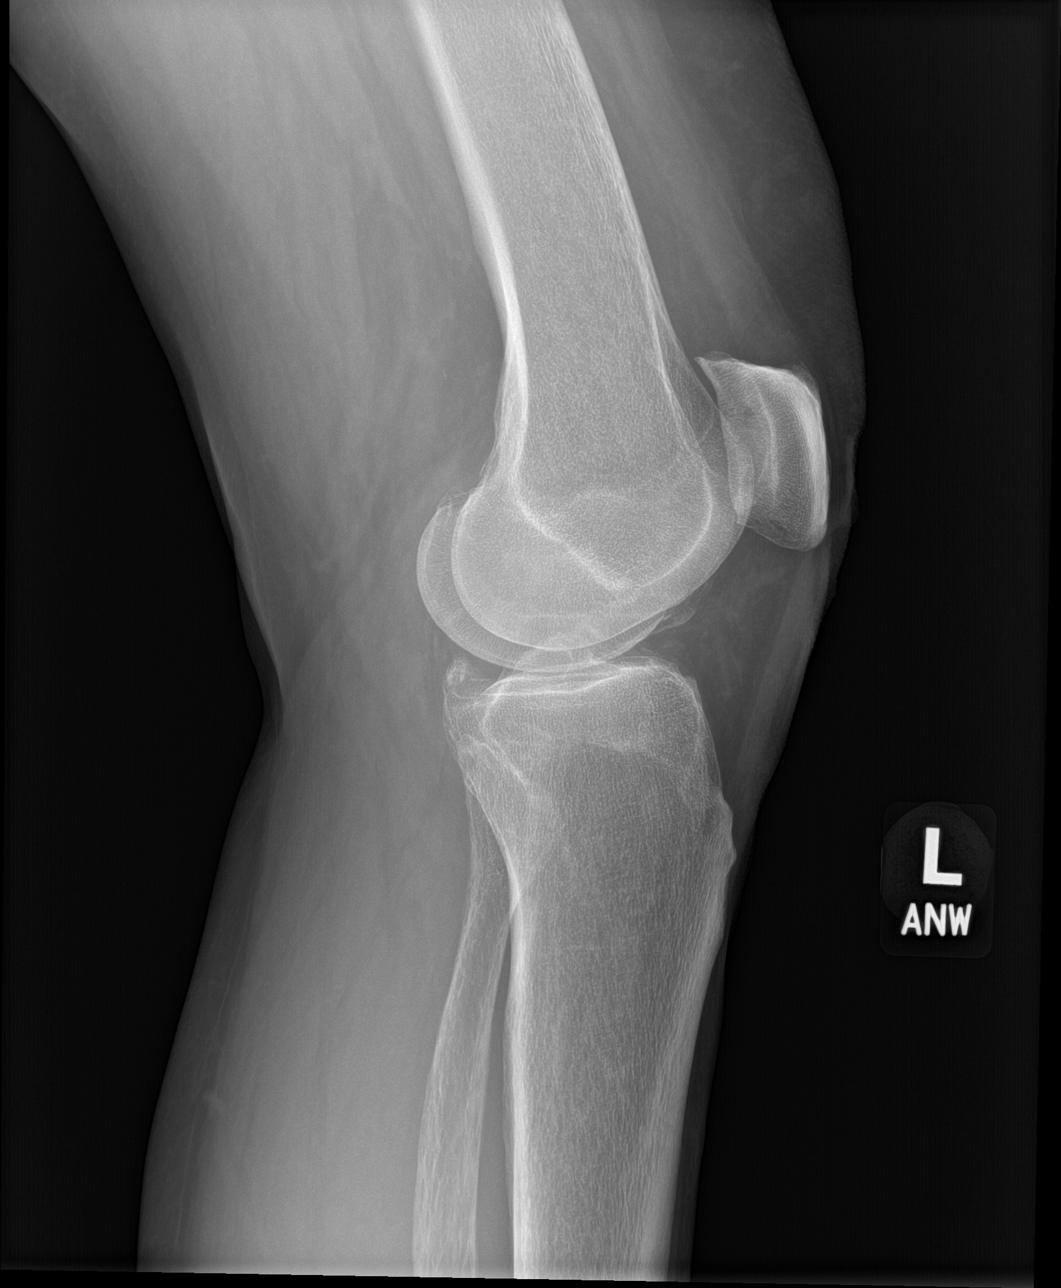

[4 of 4 positions shown; findings below may reference images not displayed]

FINDINGS: Degenerative changes are noted in the medial and patellofemoral
joint spaces. No joint effusion is seen. No acute fracture or
dislocation is noted.
IMPRESSION: Degenerative change without acute abnormality.

## 2024-08-03 ENCOUNTER — Encounter: Payer: Self-pay | Admitting: Nurse Practitioner

## 2025-01-11 ENCOUNTER — Ambulatory Visit (HOSPITAL_BASED_OUTPATIENT_CLINIC_OR_DEPARTMENT_OTHER): Payer: Medicare Other | Admitting: Obstetrics & Gynecology
